# Patient Record
Sex: Female | Born: 1970 | Race: Black or African American | Hispanic: No | Marital: Single | Smoking: Never smoker
Health system: Southern US, Community
[De-identification: ages and names within clinical notes are randomized; demographics above are authoritative.]

## PROBLEM LIST (undated history)

## (undated) ENCOUNTER — Emergency Department (HOSPITAL_COMMUNITY): Admission: EM | Payer: Medicare HMO | Source: Home / Self Care

## (undated) DIAGNOSIS — F1910 Other psychoactive substance abuse, uncomplicated: Secondary | ICD-10-CM

## (undated) DIAGNOSIS — T7840XA Allergy, unspecified, initial encounter: Secondary | ICD-10-CM

## (undated) DIAGNOSIS — R569 Unspecified convulsions: Secondary | ICD-10-CM

## (undated) DIAGNOSIS — I10 Essential (primary) hypertension: Secondary | ICD-10-CM

## (undated) DIAGNOSIS — K219 Gastro-esophageal reflux disease without esophagitis: Secondary | ICD-10-CM

## (undated) DIAGNOSIS — F32A Depression, unspecified: Secondary | ICD-10-CM

## (undated) DIAGNOSIS — F1010 Alcohol abuse, uncomplicated: Secondary | ICD-10-CM

## (undated) DIAGNOSIS — R011 Cardiac murmur, unspecified: Secondary | ICD-10-CM

## (undated) DIAGNOSIS — F329 Major depressive disorder, single episode, unspecified: Secondary | ICD-10-CM

## (undated) DIAGNOSIS — F319 Bipolar disorder, unspecified: Secondary | ICD-10-CM

## (undated) HISTORY — DX: Allergy, unspecified, initial encounter: T78.40XA

## (undated) HISTORY — DX: Cardiac murmur, unspecified: R01.1

## (undated) HISTORY — PX: TUBAL LIGATION: SHX77

## (undated) HISTORY — DX: Gastro-esophageal reflux disease without esophagitis: K21.9

## (undated) HISTORY — DX: Other psychoactive substance abuse, uncomplicated: F19.10

## (undated) HISTORY — PX: ESOPHAGOPLASTY: SUR459

## (undated) HISTORY — PX: BACK SURGERY: SHX140

---

## 2000-06-15 ENCOUNTER — Emergency Department (HOSPITAL_COMMUNITY): Admission: EM | Admit: 2000-06-15 | Discharge: 2000-06-16 | Payer: Self-pay | Admitting: Emergency Medicine

## 2002-09-23 ENCOUNTER — Encounter: Payer: Self-pay | Admitting: Emergency Medicine

## 2002-09-23 ENCOUNTER — Emergency Department (HOSPITAL_COMMUNITY): Admission: EM | Admit: 2002-09-23 | Discharge: 2002-09-23 | Payer: Self-pay | Admitting: Emergency Medicine

## 2003-08-11 ENCOUNTER — Emergency Department (HOSPITAL_COMMUNITY): Admission: AD | Admit: 2003-08-11 | Discharge: 2003-08-12 | Payer: Self-pay | Admitting: Emergency Medicine

## 2003-09-23 ENCOUNTER — Emergency Department (HOSPITAL_COMMUNITY): Admission: EM | Admit: 2003-09-23 | Discharge: 2003-09-23 | Payer: Self-pay | Admitting: Family Medicine

## 2003-11-05 ENCOUNTER — Emergency Department (HOSPITAL_COMMUNITY): Admission: EM | Admit: 2003-11-05 | Discharge: 2003-11-05 | Payer: Self-pay | Admitting: Family Medicine

## 2004-02-01 ENCOUNTER — Emergency Department (HOSPITAL_COMMUNITY): Admission: EM | Admit: 2004-02-01 | Discharge: 2004-02-02 | Payer: Self-pay | Admitting: Emergency Medicine

## 2004-03-28 ENCOUNTER — Emergency Department (HOSPITAL_COMMUNITY): Admission: EM | Admit: 2004-03-28 | Discharge: 2004-03-28 | Payer: Self-pay | Admitting: Family Medicine

## 2004-04-18 ENCOUNTER — Emergency Department (HOSPITAL_COMMUNITY): Admission: EM | Admit: 2004-04-18 | Discharge: 2004-04-18 | Payer: Self-pay | Admitting: Family Medicine

## 2004-06-13 ENCOUNTER — Emergency Department (HOSPITAL_COMMUNITY): Admission: EM | Admit: 2004-06-13 | Discharge: 2004-06-13 | Payer: Self-pay | Admitting: Family Medicine

## 2004-07-12 ENCOUNTER — Emergency Department (HOSPITAL_COMMUNITY): Admission: EM | Admit: 2004-07-12 | Discharge: 2004-07-12 | Payer: Self-pay | Admitting: Family Medicine

## 2004-08-19 ENCOUNTER — Emergency Department (HOSPITAL_COMMUNITY): Admission: EM | Admit: 2004-08-19 | Discharge: 2004-08-19 | Payer: Self-pay | Admitting: Family Medicine

## 2004-12-17 ENCOUNTER — Emergency Department (HOSPITAL_COMMUNITY): Admission: EM | Admit: 2004-12-17 | Discharge: 2004-12-17 | Payer: Self-pay | Admitting: Emergency Medicine

## 2005-03-25 ENCOUNTER — Emergency Department (HOSPITAL_COMMUNITY): Admission: EM | Admit: 2005-03-25 | Discharge: 2005-03-25 | Payer: Self-pay | Admitting: Family Medicine

## 2005-04-09 ENCOUNTER — Emergency Department (HOSPITAL_COMMUNITY): Admission: EM | Admit: 2005-04-09 | Discharge: 2005-04-09 | Payer: Self-pay | Admitting: Family Medicine

## 2005-05-12 ENCOUNTER — Encounter: Admission: RE | Admit: 2005-05-12 | Discharge: 2005-05-12 | Payer: Self-pay | Admitting: Gastroenterology

## 2005-05-16 ENCOUNTER — Encounter: Admission: RE | Admit: 2005-05-16 | Discharge: 2005-05-16 | Payer: Self-pay | Admitting: Gastroenterology

## 2005-05-25 ENCOUNTER — Emergency Department (HOSPITAL_COMMUNITY): Admission: EM | Admit: 2005-05-25 | Discharge: 2005-05-26 | Payer: Self-pay | Admitting: Emergency Medicine

## 2005-06-01 ENCOUNTER — Emergency Department (HOSPITAL_COMMUNITY): Admission: EM | Admit: 2005-06-01 | Discharge: 2005-06-01 | Payer: Self-pay | Admitting: Family Medicine

## 2005-06-18 ENCOUNTER — Ambulatory Visit: Payer: Self-pay | Admitting: Family Medicine

## 2005-06-25 ENCOUNTER — Ambulatory Visit (HOSPITAL_COMMUNITY): Admission: RE | Admit: 2005-06-25 | Discharge: 2005-06-25 | Payer: Self-pay | Admitting: Gastroenterology

## 2005-07-24 ENCOUNTER — Encounter (INDEPENDENT_AMBULATORY_CARE_PROVIDER_SITE_OTHER): Payer: Self-pay | Admitting: Family Medicine

## 2005-07-24 LAB — CONVERTED CEMR LAB: Pap Smear: NORMAL

## 2005-08-21 ENCOUNTER — Other Ambulatory Visit: Admission: RE | Admit: 2005-08-21 | Discharge: 2005-08-21 | Payer: Self-pay | Admitting: Family Medicine

## 2005-08-21 ENCOUNTER — Ambulatory Visit: Payer: Self-pay | Admitting: Family Medicine

## 2005-08-21 ENCOUNTER — Encounter: Payer: Self-pay | Admitting: Family Medicine

## 2006-02-20 ENCOUNTER — Emergency Department (HOSPITAL_COMMUNITY): Admission: EM | Admit: 2006-02-20 | Discharge: 2006-02-20 | Payer: Self-pay | Admitting: Family Medicine

## 2006-03-06 ENCOUNTER — Emergency Department (HOSPITAL_COMMUNITY): Admission: EM | Admit: 2006-03-06 | Discharge: 2006-03-06 | Payer: Self-pay | Admitting: Family Medicine

## 2006-03-09 ENCOUNTER — Emergency Department (HOSPITAL_COMMUNITY): Admission: EM | Admit: 2006-03-09 | Discharge: 2006-03-09 | Payer: Self-pay | Admitting: Family Medicine

## 2006-03-20 ENCOUNTER — Emergency Department (HOSPITAL_COMMUNITY): Admission: EM | Admit: 2006-03-20 | Discharge: 2006-03-20 | Payer: Self-pay | Admitting: Emergency Medicine

## 2006-05-01 ENCOUNTER — Emergency Department (HOSPITAL_COMMUNITY): Admission: EM | Admit: 2006-05-01 | Discharge: 2006-05-01 | Payer: Self-pay | Admitting: Family Medicine

## 2006-06-23 ENCOUNTER — Emergency Department (HOSPITAL_COMMUNITY): Admission: EM | Admit: 2006-06-23 | Discharge: 2006-06-23 | Payer: Self-pay | Admitting: Emergency Medicine

## 2006-09-23 ENCOUNTER — Emergency Department (HOSPITAL_COMMUNITY): Admission: EM | Admit: 2006-09-23 | Discharge: 2006-09-23 | Payer: Self-pay | Admitting: Family Medicine

## 2007-01-29 ENCOUNTER — Emergency Department (HOSPITAL_COMMUNITY): Admission: EM | Admit: 2007-01-29 | Discharge: 2007-01-29 | Payer: Self-pay | Admitting: Family Medicine

## 2007-02-05 ENCOUNTER — Emergency Department (HOSPITAL_COMMUNITY): Admission: EM | Admit: 2007-02-05 | Discharge: 2007-02-05 | Payer: Self-pay | Admitting: Family Medicine

## 2007-02-17 ENCOUNTER — Encounter (INDEPENDENT_AMBULATORY_CARE_PROVIDER_SITE_OTHER): Payer: Self-pay | Admitting: Family Medicine

## 2007-02-17 DIAGNOSIS — Z8669 Personal history of other diseases of the nervous system and sense organs: Secondary | ICD-10-CM | POA: Insufficient documentation

## 2007-02-17 DIAGNOSIS — K219 Gastro-esophageal reflux disease without esophagitis: Secondary | ICD-10-CM | POA: Insufficient documentation

## 2007-02-17 DIAGNOSIS — F329 Major depressive disorder, single episode, unspecified: Secondary | ICD-10-CM | POA: Insufficient documentation

## 2007-02-18 ENCOUNTER — Telehealth (INDEPENDENT_AMBULATORY_CARE_PROVIDER_SITE_OTHER): Payer: Self-pay | Admitting: *Deleted

## 2007-04-08 ENCOUNTER — Ambulatory Visit (HOSPITAL_COMMUNITY): Admission: RE | Admit: 2007-04-08 | Discharge: 2007-04-08 | Payer: Self-pay | Admitting: Gastroenterology

## 2007-04-15 ENCOUNTER — Emergency Department (HOSPITAL_COMMUNITY): Admission: EM | Admit: 2007-04-15 | Discharge: 2007-04-15 | Payer: Self-pay | Admitting: Emergency Medicine

## 2007-06-03 ENCOUNTER — Inpatient Hospital Stay (HOSPITAL_COMMUNITY): Admission: EM | Admit: 2007-06-03 | Discharge: 2007-06-04 | Payer: Self-pay | Admitting: Emergency Medicine

## 2007-06-05 ENCOUNTER — Other Ambulatory Visit: Payer: Self-pay | Admitting: Emergency Medicine

## 2007-06-05 ENCOUNTER — Inpatient Hospital Stay (HOSPITAL_COMMUNITY): Admission: EM | Admit: 2007-06-05 | Discharge: 2007-06-11 | Payer: Self-pay | Admitting: Psychiatry

## 2007-06-07 ENCOUNTER — Ambulatory Visit: Payer: Self-pay | Admitting: Psychiatry

## 2007-06-19 ENCOUNTER — Emergency Department (HOSPITAL_COMMUNITY): Admission: EM | Admit: 2007-06-19 | Discharge: 2007-06-19 | Payer: Self-pay | Admitting: Emergency Medicine

## 2007-07-28 ENCOUNTER — Emergency Department (HOSPITAL_COMMUNITY): Admission: EM | Admit: 2007-07-28 | Discharge: 2007-07-28 | Payer: Self-pay | Admitting: Emergency Medicine

## 2007-08-11 ENCOUNTER — Emergency Department (HOSPITAL_COMMUNITY): Admission: EM | Admit: 2007-08-11 | Discharge: 2007-08-11 | Payer: Self-pay | Admitting: Emergency Medicine

## 2007-08-29 ENCOUNTER — Emergency Department (HOSPITAL_COMMUNITY): Admission: EM | Admit: 2007-08-29 | Discharge: 2007-08-29 | Payer: Self-pay | Admitting: Emergency Medicine

## 2008-12-19 ENCOUNTER — Ambulatory Visit (HOSPITAL_COMMUNITY): Admission: RE | Admit: 2008-12-19 | Discharge: 2008-12-19 | Payer: Self-pay | Admitting: Gastroenterology

## 2009-05-26 ENCOUNTER — Emergency Department (HOSPITAL_COMMUNITY): Admission: EM | Admit: 2009-05-26 | Discharge: 2009-05-26 | Payer: Self-pay | Admitting: Emergency Medicine

## 2009-05-26 ENCOUNTER — Emergency Department (HOSPITAL_COMMUNITY): Admission: EM | Admit: 2009-05-26 | Discharge: 2009-05-27 | Payer: Self-pay | Admitting: Emergency Medicine

## 2009-05-27 ENCOUNTER — Emergency Department (HOSPITAL_COMMUNITY): Admission: EM | Admit: 2009-05-27 | Discharge: 2009-05-28 | Payer: Self-pay | Admitting: Emergency Medicine

## 2009-06-07 ENCOUNTER — Emergency Department (HOSPITAL_COMMUNITY): Admission: EM | Admit: 2009-06-07 | Discharge: 2009-06-15 | Payer: Self-pay | Admitting: Emergency Medicine

## 2009-06-27 ENCOUNTER — Emergency Department (HOSPITAL_COMMUNITY): Admission: EM | Admit: 2009-06-27 | Discharge: 2009-06-28 | Payer: Self-pay | Admitting: Emergency Medicine

## 2009-08-01 ENCOUNTER — Emergency Department (HOSPITAL_COMMUNITY): Admission: EM | Admit: 2009-08-01 | Discharge: 2009-08-01 | Payer: Self-pay | Admitting: Emergency Medicine

## 2009-10-30 ENCOUNTER — Ambulatory Visit (HOSPITAL_COMMUNITY): Admission: RE | Admit: 2009-10-30 | Discharge: 2009-10-30 | Payer: Self-pay | Admitting: Gastroenterology

## 2010-06-15 ENCOUNTER — Encounter: Payer: Self-pay | Admitting: Gastroenterology

## 2010-06-16 ENCOUNTER — Encounter: Payer: Self-pay | Admitting: Internal Medicine

## 2010-08-11 LAB — HEMOGLOBIN A1C
Hgb A1c MFr Bld: 5.7 % (ref 4.6–6.1)
Mean Plasma Glucose: 117 mg/dL

## 2010-08-11 LAB — COMPREHENSIVE METABOLIC PANEL
ALT: 56 U/L — ABNORMAL HIGH (ref 0–35)
AST: 159 U/L — ABNORMAL HIGH (ref 0–37)
Albumin: 3.9 g/dL (ref 3.5–5.2)
Alkaline Phosphatase: 88 U/L (ref 39–117)
BUN: 10 mg/dL (ref 6–23)
CO2: 22 mEq/L (ref 19–32)
Calcium: 8.8 mg/dL (ref 8.4–10.5)
Chloride: 106 mEq/L (ref 96–112)
Creatinine, Ser: 0.82 mg/dL (ref 0.4–1.2)
GFR calc Af Amer: >60 mL/min (ref >60)
GFR calc non Af Amer: >60 mL/min (ref >60)
Glucose, Bld: 79 mg/dL (ref 70–99)
Potassium: 3.5 mEq/L (ref 3.5–5.1)
Sodium: 138 mEq/L (ref 135–145)
Total Bilirubin: 0.5 mg/dL (ref 0.3–1.2)
Total Protein: 8 g/dL (ref 6.0–8.3)

## 2010-08-11 LAB — POCT PREGNANCY, URINE: Preg Test, Ur: NEGATIVE

## 2010-08-11 LAB — DIFFERENTIAL
Basophils Absolute: 0 10*3/uL (ref 0.0–0.1)
Basophils Absolute: 0.1 10*3/uL (ref 0.0–0.1)
Basophils Relative: 0 % (ref 0–1)
Basophils Relative: 1 % (ref 0–1)
Eosinophils Absolute: 0 10*3/uL (ref 0.0–0.7)
Eosinophils Absolute: 0 10*3/uL (ref 0.0–0.7)
Eosinophils Relative: 0 % (ref 0–5)
Eosinophils Relative: 0 % (ref 0–5)
Lymphocytes Relative: 23 % (ref 12–46)
Lymphocytes Relative: 15 % (ref 12–46)
Lymphs Abs: 1.2 10*3/uL (ref 0.7–4.0)
Lymphs Abs: 0.9 10*3/uL (ref 0.7–4.0)
Monocytes Absolute: 0.5 10*3/uL (ref 0.1–1.0)
Monocytes Absolute: 0.2 10*3/uL (ref 0.1–1.0)
Monocytes Relative: 9 % (ref 3–12)
Monocytes Relative: 4 % (ref 3–12)
Neutro Abs: 3.6 10*3/uL (ref 1.7–7.7)
Neutro Abs: 4.5 10*3/uL (ref 1.7–7.7)
Neutrophils Relative %: 68 % (ref 43–77)
Neutrophils Relative %: 80 % — ABNORMAL HIGH (ref 43–77)

## 2010-08-11 LAB — URINALYSIS, ROUTINE W REFLEX MICROSCOPIC
Bilirubin Urine: NEGATIVE
Glucose, UA: NEGATIVE mg/dL
Glucose, UA: NEGATIVE mg/dL
Hgb urine dipstick: NEGATIVE
Hgb urine dipstick: NEGATIVE
Ketones, ur: >80 mg/dL — AB
Ketones, ur: 15 mg/dL — AB
Nitrite: NEGATIVE
Nitrite: NEGATIVE
Protein, ur: NEGATIVE mg/dL
Protein, ur: NEGATIVE mg/dL
Specific Gravity, Urine: 1.031 — ABNORMAL HIGH (ref 1.005–1.030)
Specific Gravity, Urine: 1.016 (ref 1.005–1.030)
Urobilinogen, UA: 1 mg/dL (ref 0.0–1.0)
Urobilinogen, UA: 1 mg/dL (ref 0.0–1.0)
pH: 5.5 (ref 5.0–8.0)
pH: 6 (ref 5.0–8.0)

## 2010-08-11 LAB — POCT URINALYSIS DIP (DEVICE)
Glucose, UA: NEGATIVE mg/dL
Ketones, ur: 80 mg/dL — AB
Nitrite: NEGATIVE
Protein, ur: 100 mg/dL — AB
Specific Gravity, Urine: >=1.03 (ref 1.005–1.030)
Urobilinogen, UA: 1 mg/dL (ref 0.0–1.0)
pH: 5.5 (ref 5.0–8.0)

## 2010-08-11 LAB — CBC
HCT: 42.2 % (ref 36.0–46.0)
HCT: 40.5 % (ref 36.0–46.0)
Hemoglobin: 13.9 g/dL (ref 12.0–15.0)
Hemoglobin: 12.9 g/dL (ref 12.0–15.0)
MCHC: 32.9 g/dL (ref 30.0–36.0)
MCHC: 31.9 g/dL (ref 30.0–36.0)
MCV: 69.2 fL — ABNORMAL LOW (ref 78.0–100.0)
MCV: 69.3 fL — ABNORMAL LOW (ref 78.0–100.0)
Platelets: 177 10*3/uL (ref 150–400)
Platelets: 250 10*3/uL (ref 150–400)
RBC: 6.11 MIL/uL — ABNORMAL HIGH (ref 3.87–5.11)
RBC: 5.85 MIL/uL — ABNORMAL HIGH (ref 3.87–5.11)
RDW: 18.5 % — ABNORMAL HIGH (ref 11.5–15.5)
RDW: 19.8 % — ABNORMAL HIGH (ref 11.5–15.5)
WBC: 5.3 10*3/uL (ref 4.0–10.5)
WBC: 5.7 10*3/uL (ref 4.0–10.5)

## 2010-08-11 LAB — RAPID URINE DRUG SCREEN, HOSP PERFORMED
Amphetamines: NOT DETECTED
Amphetamines: NOT DETECTED
Barbiturates: NOT DETECTED
Barbiturates: NOT DETECTED
Benzodiazepines: NOT DETECTED
Benzodiazepines: POSITIVE — AB
Cocaine: NOT DETECTED
Cocaine: NOT DETECTED
Opiates: NOT DETECTED
Opiates: NOT DETECTED
Tetrahydrocannabinol: NOT DETECTED
Tetrahydrocannabinol: NOT DETECTED

## 2010-08-11 LAB — URINE MICROSCOPIC-ADD ON

## 2010-08-11 LAB — LIPID PANEL
Cholesterol: 143 mg/dL (ref 0–200)
HDL: 64 mg/dL (ref >39)
LDL Cholesterol: 69 mg/dL (ref 0–99)
Total CHOL/HDL Ratio: 2.2 RATIO
Triglycerides: 51 mg/dL (ref <150)
VLDL: 10 mg/dL (ref 0–40)

## 2010-08-11 LAB — URINE CULTURE: Colony Count: >=100000

## 2010-08-11 LAB — PREGNANCY, URINE: Preg Test, Ur: NEGATIVE

## 2010-08-11 LAB — BASIC METABOLIC PANEL
BUN: 6 mg/dL (ref 6–23)
CO2: 26 mEq/L (ref 19–32)
Calcium: 9.1 mg/dL (ref 8.4–10.5)
Chloride: 99 mEq/L (ref 96–112)
Creatinine, Ser: 0.86 mg/dL (ref 0.4–1.2)
GFR calc Af Amer: >60 mL/min (ref >60)
GFR calc non Af Amer: >60 mL/min (ref >60)
Glucose, Bld: 108 mg/dL — ABNORMAL HIGH (ref 70–99)
Potassium: 3.5 mEq/L (ref 3.5–5.1)
Sodium: 133 mEq/L — ABNORMAL LOW (ref 135–145)

## 2010-08-11 LAB — TSH: TSH: 0.858 u[IU]/mL (ref 0.350–4.500)

## 2010-08-11 LAB — T4, FREE: Free T4: 1.61 ng/dL (ref 0.80–1.80)

## 2010-08-11 LAB — ETHANOL
Alcohol, Ethyl (B): <5 mg/dL (ref 0–10)
Alcohol, Ethyl (B): <5 mg/dL (ref 0–10)

## 2010-08-11 LAB — MONONUCLEOSIS SCREEN: Mono Screen: NEGATIVE

## 2010-08-11 LAB — VALPROIC ACID LEVEL: Valproic Acid Lvl: 100.3 ug/mL — ABNORMAL HIGH (ref 50.0–100.0)

## 2010-08-14 LAB — DIFFERENTIAL
Basophils Absolute: 0 10*3/uL (ref 0.0–0.1)
Basophils Relative: 0 % (ref 0–1)
Eosinophils Absolute: 0.1 10*3/uL (ref 0.0–0.7)
Eosinophils Relative: 1 % (ref 0–5)
Lymphocytes Relative: 35 % (ref 12–46)
Lymphs Abs: 1.8 10*3/uL (ref 0.7–4.0)
Monocytes Absolute: 0.5 10*3/uL (ref 0.1–1.0)
Monocytes Relative: 10 % (ref 3–12)
Neutro Abs: 2.7 10*3/uL (ref 1.7–7.7)
Neutrophils Relative %: 54 % (ref 43–77)

## 2010-08-14 LAB — BASIC METABOLIC PANEL
BUN: 5 mg/dL — ABNORMAL LOW (ref 6–23)
CO2: 23 mEq/L (ref 19–32)
Calcium: 8.7 mg/dL (ref 8.4–10.5)
Chloride: 109 mEq/L (ref 96–112)
Creatinine, Ser: 0.78 mg/dL (ref 0.4–1.2)
GFR calc Af Amer: >60 mL/min (ref >60)
GFR calc non Af Amer: >60 mL/min (ref >60)
Glucose, Bld: 97 mg/dL (ref 70–99)
Potassium: 3.1 mEq/L — ABNORMAL LOW (ref 3.5–5.1)
Sodium: 141 mEq/L (ref 135–145)

## 2010-08-14 LAB — RAPID URINE DRUG SCREEN, HOSP PERFORMED
Amphetamines: NOT DETECTED
Barbiturates: NOT DETECTED
Benzodiazepines: POSITIVE — AB
Cocaine: NOT DETECTED
Opiates: NOT DETECTED
Tetrahydrocannabinol: NOT DETECTED

## 2010-08-14 LAB — URINE MICROSCOPIC-ADD ON

## 2010-08-14 LAB — URINALYSIS, ROUTINE W REFLEX MICROSCOPIC
Glucose, UA: NEGATIVE mg/dL
Leukocytes, UA: NEGATIVE
Nitrite: NEGATIVE
Protein, ur: NEGATIVE mg/dL
Specific Gravity, Urine: 1.035 — ABNORMAL HIGH (ref 1.005–1.030)
Urobilinogen, UA: 1 mg/dL (ref 0.0–1.0)
pH: 6 (ref 5.0–8.0)

## 2010-08-14 LAB — CBC
HCT: 39.1 % (ref 36.0–46.0)
Hemoglobin: 12.6 g/dL (ref 12.0–15.0)
MCHC: 32.3 g/dL (ref 30.0–36.0)
MCV: 71.2 fL — ABNORMAL LOW (ref 78.0–100.0)
Platelets: 202 10*3/uL (ref 150–400)
RBC: 5.5 MIL/uL — ABNORMAL HIGH (ref 3.87–5.11)
RDW: 22.2 % — ABNORMAL HIGH (ref 11.5–15.5)
WBC: 5.1 10*3/uL (ref 4.0–10.5)

## 2010-08-14 LAB — ETHANOL: Alcohol, Ethyl (B): <5 mg/dL (ref 0–10)

## 2010-08-14 LAB — POCT PREGNANCY, URINE: Preg Test, Ur: NEGATIVE

## 2010-08-14 LAB — VALPROIC ACID LEVEL: Valproic Acid Lvl: 44.6 ug/mL — ABNORMAL LOW (ref 50.0–100.0)

## 2010-10-08 NOTE — Op Note (Signed)
NAME:  Melissa Dougherty, Melissa Dougherty NO.:  [id number]   MEDICAL RECORD NO.:  [id number]          PATIENT TYPE:  AMB   LOCATION:  ENDO                         FACILITY:  [facility]   PHYSICIAN:  Graylin Shiver, M.D.   DATE OF BIRTH:  Aug 12, 1970   DATE OF PROCEDURE:  04/08/2007  DATE OF DISCHARGE:                               OPERATIVE REPORT   PROCEDURE PERFORMED:  Esophagogastroduodenoscopy with endoscopic balloon  dilatation.   INDICATIONS FOR PROCEDURE:  Dysphagia.   BRIEF HISTORY:  The patient is a 40 year old female with complaints of  dysphagia.  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 The patient had a barium swallow  in the past and it showed that the barium tablet lodged around the  distal esophagus near the esophagogastric junction area.  She underwent  EGD with dilatation in January 2007, with dilatation of both the region  of the EG junction, even though a stricture was not seen, and also  dilatation of the esophageal ring or web at 23 cm.  The patient's mother  did report to me recently that she did have some surgery on her  esophagus when she was an infant and this web or ring may be actually an  anastomosis site  Informed consent was obtained after explanation of the  risks of bleeding, infection and perforation.   PREMEDICATION:  Fentanyl 50 mcg IV, Versed 5 mg IV.   PROCEDURE:  With the patient in the left lateral decubitus position, the  Pentax gastroscope was inserted into the oropharynx and passed into the  esophagus.  It was advanced down the esophagus then into the stomach and  into the duodenum. The second portion and bulb of the duodenum looked  normal.  The stomach looked normal in its entirety as far as the mucosa  was concerned.  There was a small hiatal hernia noted. The scope was  then brought back into the esophagus.  The esophagogastric junction was  visualized. I did not obviously see a  stricture in this area but I did  place an endoscopic balloon dilator at the level of the EG junction and  inflated the balloon to 12 then 13.5 then 15 mm and held it in place at  each level for one minute. I then brought the scope back up to 23 cm  where the esophageal web was and dilated this area to 12, 13.5 and 15  mm, again, holding the dilator in place for one minute. The dilations  went well.  There was no heme at either site. She tolerated the  procedure well without complications.   IMPRESSION:  1. 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  2. Hiatal hernia.   PLAN:  We will observe the response to the dilatation.  Should she  continue to have dysphasia, will have to consider bringing her back for  repeat dilatation.           ______________________________  Graylin Shiver, M.D.  SFG/MEDQ  D:  04/08/2007  T:  04/08/2007  Job:  [id number]   cc:   Quita Skye. Artis Flock, M.D.  Fax: [phone]

## 2010-10-08 NOTE — Op Note (Signed)
Melissa Dougherty, Melissa Dougherty              ACCOUNT NO.:  [id number]   MEDICAL RECORD NO.:  [id number]          PATIENT TYPE:  AMB   LOCATION:  ENDO                         [facility]ACILITY[facility]  [facility]   PHYSICIAN:  Graylin Shiver, M.D.   DATE OF BIRTH:  21-Jun-1970   DATE OF PROCEDURE:  12/19/2008  DATE OF DISCHARGE:                               OPERATIVE REPORT   PROCEDURE:  Esophagogastroduodenoscopy with endoscopic balloon  dilatation.   INDICATIONS:  The patient has been experiencing dysphagia for the past 2-  3 months to solid food.  She has a history of an esophageal ring located  at 23 cm in the esophagus and also a hiatal hernia.  She has undergone  two endoscopies with dilatation in the past.  The dilatations were of  the esophageal ring at 23 cm and also at the EG junction because a  barium pill lodged at this level also even though a stricture was not  seen.  The previous dilatations helped her dysphagia.  The last one was  done in 2008.  Due to recurrent dysphagia, she is undergoing repeat  endoscopy with dilatation.   Informed consent was obtained after explanation of the risks of  bleeding, infection and perforation.   PREMEDICATIONS:  Fentanyl 50 mcg IV, Versed 3 mg IV.   PROCEDURE:  With the patient in the left lateral decubitus position, the  Pentax gastroscope was inserted into the oropharynx and passed into the  esophagus.  It was advanced down the esophagus then into the stomach and  into the duodenum.  The second portion and bulb of the duodenum were  normal.  The stomach had normal-appearing mucosa.  There was a hiatal  hernia.  The esophagus did not reveal a stricture at the esophagogastric  junction, but because of her history of dysphagia, I went ahead and  dilated the esophagogastric junction with a balloon dilator.  The  endoscopic balloon dilator was placed at the level of the  esophagogastric junction and held in place.  It was inflated to 12, then  13.5, then 15 mm and  held in place at each level for 30 seconds.  The  scope was then brought up to the more proximal esophagus where at 23 cm  an esophageal ring was noted.  This had been there before.  This was  also dilated with the balloon dilator to 12, 13.5 and 15 mm, holding the  dilator inflated in place between 30 and 60 seconds.  She tolerated the  procedure well without complications.   IMPRESSION:  1. Dysphagia to solid foods.  2. Esophageal ring at 23 cm, dilated to 15 mm.  3. Esophagogastric junction dilated to 15 mm.   PLAN:  Observe response to dilatation.           ______________________________  Graylin Shiver, M.D.     SFG/MEDQ  D:  12/19/2008  T:  12/19/2008  Job:  [id number]

## 2010-10-08 NOTE — H&P (Signed)
[name], Melissa Dougherty              ACCOUNT NO.:  [id number]   MEDICAL RECORD NO.:  [id number]          PATIENT TYPE:  INP   LOCATION:  4714                         FACILITY:  [facility]   PHYSICIAN:  Fleet Contras, M.D.    DATE OF BIRTH:  05-16-1971   DATE OF ADMISSION:  06/03/2007  DATE OF DISCHARGE:                              HISTORY & PHYSICAL   REPORT TITLE:  HISTORY AND PHYSICAL.   PRESENTING COMPLAINT:  Passing out.   HISTORY OF PRESENT ILLNESS:  The patient is a 40 year old African  American lady with past medical history significant for schizophrenia,  seizure disorder and gastroesophageal reflux disease.  She presented to  the emergency room at [facility] in the early hours of this day  with complaints of having episodes of passing out.  She stated that she  has been feeling unwell for about a week or so and not eating much,  throwing up, unable to keep food or drinks down, not been taking her  seizure medications.  She has no weakness.  She had a seizure, but she  said she has been having episodes of passing out at home.  She did not  hit her head or have any other injuries, but she was having difficulty  walking when she came to the emergency room.  She denies having any  blurring of vision, weakness of extremities, slurring of speech, chest  pain, shortness of breath, orthopnea, palpitations or paroxysmal  nocturnal dyspnea.  At the emergency room her blood pressure was 111/72  on arrival, heart rate of 86 regular, respiratory of 20, temperature  97.7, and O2 saturations on room air was 100%.  She was evaluated by the  emergency room physicians and thought to have had a seizure.  Initial  laboratory data showed a potassium of 3.4.  Her valproic acid levels  were slightly low at 40.5.  Urine drug screen was positive for opiates  which was not prescribed by me.  She was last seen in my office about a  year ago and has not returned for followup since then.  She admits  to  not being compliant with her medications.   PAST MEDICAL HISTORY:  1. Schizophrenia.  2. Gastroesophageal reflux disease.  3. Seizure disorder.   MEDICATION HISTORY:  1. Risperdal 1 mg every night.  2. Depakote 50 mg daily every night.  3. MiraLax 1 tablespoon daily p.r.n.  4. Protonix 40 mg once a day.   ALLERGIES:  SHE HAS NO KNOWN DRUG ALLERGIES.   FAMILY AND SOCIAL HISTORY:  She lives with her children.  Father is  deceased of unknown cause.  Her mother is alive and well.  She has 1  brother who is alive and well.  She has 2 children, 1 boy and a girl who  are alive and well.  She denies any use of alcohol, tobacco or illicit  drugs.   REVIEW OF SYSTEMS:  Essentially as above.   PHYSICAL EXAMINATION:  GENERAL:  She is lying comfortably in the  hospital bed not in acute respiratory or painful distress.  She is  not  pale.  She is not icteric.  She is not cyanosed.  HEENT:  She is slightly dehydrated with dry oral or tongue mucosa.  Her  pupils are equal and reactive to light and accommodation.  VITAL SIGNS:  Blood pressure of 112/81, heart rate of 82, respiratory  rate of 22, and temperature 97.2.  NECK:  Supple with no elevated JVD.  No cervical lymphadenopathy.  CHEST:  Shows good air entry bilaterally with no rales, rhonchi or  wheezes.  HEART:  Heart sounds 1 and 2 are heard with no murmurs.  ABDOMEN:  Flat, soft, nontender.  There are multiple scars in the  abdomen.  Bowel sounds are present.  EXTREMITIES:  Shows no edema, no  calf tenderness or swelling.  CNS: She is alert and oriented x3 with no focal neurological deficits.   LABORATORY DATA:  White count is 5.8, hemoglobin 12.8, hematocrit 40,  platelet count of 193.  Sodium is 138, potassium 3.4, chloride 107,  bicarbonate of 22.7, BUN 5, creatinine 1.  Valproic acid level is 4.5.  An EKG shows normal sinus rhythm with no acute ST-T wave changes.  A CT  scan is reported as showing no acute intracranial  lesions.  Urine drug  screen is positive for opiates, negative for cocaine, benzodiazepines or  cannabinoids.  Urinalysis is normal and urine pregnancy test is  negative.   ASSESSMENT:  The patient is a 36-year African American lady with past  medical history significant for seizure disorder admitted with episodes  of syncope most likely representing seizure episodes due to  noncompliance with prescribed medications subtherapeutic levels of  Depakote.  She will be admitted to the hospital for further evaluation.   ADMISSION DIAGNOSES:  1. Seizure disorder.  2. Mild dehydration.  3. Hypokalemia.   PLAN OF CARE:  She will be placed on a telemetry bed.  An MRI scan of  the brain will be performed to rule out any acute intracranial process  not detected on CT scan.  She will be on IV fluids, normal saline at 75  cc an hour.  Home medication will be resumed including Protonix,  Risperdal and Depakote.  Some physical therapy consult will be requested  to help ambulate her.  Laboratory studies will be repeated in the a.m.  to see any improvement.  Potassium will be repleted.  This plan of care  has been discussed with her and her questions answered.      Fleet Contras, M.D.  Electronically Signed     EA/MEDQ  D:  06/03/2007  T:  06/04/2007  Job:  [id number]

## 2010-10-08 NOTE — Discharge Summary (Signed)
Melissa Dougherty, Melissa Dougherty NO.:  [id number]   MEDICAL RECORD NO.:  [id number]          PATIENT TYPE:  IPS   LOCATION:  0400                          [facility]  [facility]   PHYSICIAN:  Anselm Jungling, MD  DATE OF BIRTH:  1970/06/28   DATE OF ADMISSION:  06/05/2007  DATE OF DISCHARGE:  06/11/2007                               DISCHARGE SUMMARY   IDENTIFYING DATA AND REASON FOR ADMISSION:  This was an inpatient  psychiatric admission for Melissa Dougherty, a 40 year old African American female  who was admitted due to increasing signs and symptoms of schizoaffective  disorder.  Please refer to the admission note for further details  pertaining to the symptoms, circumstances and history that led to her  hospitalization.  She was given an initial Axis I diagnosis of  schizoaffective disorder, NOS.   MEDICAL AND LABORATORY:  The patient was in good health without any  active or chronic medical problems.  She was continued on Protonix 40 mg  daily for GERD.   HOSPITAL COURSE:  The patient was admitted to the adult inpatient  psychiatric service.  She presented as a well-nourished, well-developed  adult female, who appeared to have some developmental issues with  respect to IQ.  She was irritable, pressured, with loud vocal  intonation.  She had minimal insight into her need for treatment.  She  tried to persuade me that she did not need to be in the inpatient  setting.   The patient was treated with Risperdal and Depakote, which had shown  evidence of stabilizing her in the past.  Dosages were adjusted  appropriately.  Her mother was contacted by staff, including our case  manager.  Discharge planning was coordinated with family and the  patient.   By the seventh hospital day the patient appeared significantly improved,  with good regulation of mood and affect and absence of overt psychotic  thinking or behavior.  The patient agreed to the following aftercare  plan.  For aftercare  the patient was to follow up at the [facility]  with an appointment on June 15, 2007.  She was also to follow up with  her usual physician on June 14, 2007, for routine medical care.   DISCHARGE MEDICATIONS:  1. Risperdal Consta 37.5 mg IM, next injection due June 17, 2007      and every 14 days thereafter.  2. Depakote ER 500 mg 2 tablets at bedtime.  3. Risperdal M-Tab 4 mg q.h.s.  4. Protonix 40 mg daily.   DISCHARGE DIAGNOSES:  AXIS I:  Schizoaffective disorder, most recently  with manic features.  AXIS II:  Deferred.  AXIS III:  Gastroesophageal reflux disease.  AXIS IV:  Stressors severe.  AXIS V:  GAF on discharge 55.      Anselm Jungling, MD  Electronically Signed     SPB/MEDQ  D:  06/14/2007  T:  06/14/2007  Job:  [id number]

## 2010-10-08 NOTE — H&P (Signed)
Melissa Dougherty, Melissa Dougherty NO.:  [id number]   MEDICAL RECORD NO.:  [id number]          PATIENT TYPE:  IPS   LOCATION:  0405                          [facility]ILITY[facility]  [facility]   PHYSICIAN:  Vic Ripper, P.A.-C.DATE OF BIRTH:  12/22/1970   DATE OF ADMISSION:  06/05/2007  DATE OF DISCHARGE:                       PSYCHIATRIC ADMISSION ASSESSMENT   This is a voluntary admission to the services of Dr. Geralyn Flash.   IDENTIFYING INFORMATION:  This is a 40 year old single African-American  female. She was admitted to the main hospital on December 8. She was  discharged the following morning on December 9. She was admitted due to  passing out. She was complaining of nausea and vomiting. Her urine was  positive for opiates. Her Depakote level was subtherapeutic at 4.5. She  came back to [facility] ED around noon on 1/10, complaining of a  headache. No drugs were found. She was also complaining of generalized  pain.  She reported she was feeling crazy, that she was suicidal. The  plan was to overdose or walk out in the road.  She does have a history  for schizoaffective disorder. She is followed at the [facility] by  the nurse practitioner Ronalee Belts and according to her pickup  records at [facility], she has been noncompliant with both her  Risperdal and her Depakote.  She recently picked up Risperdal on 1/6,  and has not had her Depakote filled since last September.   PAST PSYCHIATRIC HISTORY:  She was admitted to [facility] in  1999, and also to a facility in [place]. She is currently followed as an  outpatient at [facility] health.   SOCIAL HISTORY:  She went to the eleventh grade. She has never married.  She has a son age 87, a daughter age 20. She gets SSI, but she states  the DSS takes care of her money for her.   FAMILY HISTORY:  She denies alcohol and drug history. She denies having  a primary care physician. She does  acknowledge being followed at  [facility] by Ronalee Belts, the nurse  practitioner. She denies any PCC.   MEDICAL PROBLEMS:  She is known to have GERD and a seizure disorder.   MEDICATIONS:  According to [facility] on [address], she is suppose to be  taking 750 mg of Depakote at h.s. as well as Risperdal 1 mg at h.s. The  patient states she is due this Tuesday for a Risperdal Consta injection  at [facility]. We will put a call through to check  and see about that.   DRUG ALLERGIES:  No known drug allergies.   POSITIVE PHYSICAL FINDINGS:  Her urinalysis showed that she is beginning  to have a UTI. She had 7 to 10 WBC. Her UDS was negative. Her alcohol  level was 5. She had no other remarkable findings although today, she is  complaining of light sensitivity and headache. VITAL SIGNS: On admission  showed that she is 61 inches tall, weighs 108. Temperature is 98.2.  Blood pressure is 133/87. Pulse 108. Respirations are  16. MENTAL STATUS  EXAM TODAY: She is alert and oriented. She is casually dressed in a  combination of hospital gowns and some of her own clothes. She appears  to be adequately groomed and nourished. Her speech is normal rate,  rhythm and tone. Her mood is somewhat irritable and anxious. Her affect  is congruent. Thought processes are clear, rational and goal oriented.  She wants to get restarted on her meds, get rid of her headache and feel  better. Judgment and insight are good. Concentration and memory are  good. Intelligence is at least average.  On concentration and memory,  she was able to look up the phone number of the pharmacy in the phone  book. She denies being suicidal or homicidal. She denies auditory visual  hallucinations.   DIAGNOSES:   AXIS I:  Schizoaffective disorder noncompliant with medications.   AXIS II:  Deferred.   AXIS III:  History for seizure disorder. Syncope was probably due to  noncompliance  with Depakote as evidence by her subtherapeutic level of  Depakote. She is often known to have reflux and she is trying to state  that she has migraine headaches.   AXIS IV:  Mild-to-moderate stressors.   AXIS V:  40.   PLAN:  Is to admit for safety and stabilization. We will restart her  medications at the prescribed dosages. We will check Monday with the  nurse practitioner Ronalee Belts regarding Risperdal Consta and make  follow up plans regarding that and we will get her seen regarding her  migraine headaches and seizure disorder.  Estimated length of stay is  three to four days.      Vic Ripper, P.A.-C.     MD/MEDQ  D:  06/06/2007  T:  06/07/2007  Job:  [id number]

## 2010-10-11 NOTE — Discharge Summary (Signed)
NAMESENITA, CORREDOR              ACCOUNT NO.:  [id number]   MEDICAL RECORD NO.:  [id number]          PATIENT TYPE:  INP   LOCATION:  4714                         FACILITY:  [facility]   PHYSICIAN:  Fleet Contras, M.D.    DATE OF BIRTH:  [birth year]   DATE OF ADMISSION:  06/03/2007  DATE OF DISCHARGE:  06/04/2007                               DISCHARGE SUMMARY   HISTORY OF PRESENT ILLNESS:  Please see the full dictated admission for  full details.   SUMMARY:  Ms. Handler is a 40 year old African American lady with past  medical history for seizure disorder, , gastroesophageal reflux disease  who came to the emergency room with episodes of syncope most likely due  to seizure episodes due to poor compliance with prescribed medications  including Depakote.  She was therefore admitted to the hospital for  close monitoring and further evaluation.   HOSPITAL COURSE:  On admission shows monitored on telemetry and MRI scan  of the brain was performed.  This was negative for acute lesions, and  she received Depakote dose and it was increased to 73.7.  She was  feeling much better without any further episodes vital signs are stable.  Neurologically normal,  she was considered stable for discharge home.   DISCHARGE DIAGNOSES:  1. Seizure disorder.  2. Dehydration.  3. Hypokalemia.   CONDITION ON DISCHARGE:  Stable.   DISPOSITION:  To home.   DISCHARGE MEDICATIONS:  1. Risperdal 1 mg po qhs  2. Depakote 20 mg 30 tablets q.h.s.  3. Xanax p.r.n. 0.5mg  daily.  4. Multivitamin once a day.      Fleet Contras, M.D.  Electronically Signed     EA/MEDQ  D:  07/07/2007  T:  07/09/2007  Job:  [id number]

## 2010-10-11 NOTE — Op Note (Signed)
NAME:  Melissa Dougherty, CORELLA NO.:  [id number]   MEDICAL RECORD NO.:  [id number]          PATIENT TYPE:  AMB   LOCATION:  ENDO                         FACILITY:  [facility]   PHYSICIAN:  Graylin Shiver, M.D.   DATE OF BIRTH:  Feb 03, 1971   DATE OF PROCEDURE:  06/25/2005  DATE OF DISCHARGE:                                 OPERATIVE REPORT   INDICATIONS FOR PROCEDURE:  Dysphagia.  Recent barium swallow showed a mid  esophageal web and when the patient swallowed a barium tablet it lodged in  the region of the distal esophagus around the esophagogastric junction area,  suggesting that there could be a stricture in this area.   Informed consent was obtained after explanation of the risks of bleeding,  infection and perforation.   PREMEDICATION:  Fentanyl 30 mcg IV, Versed 3 milligrams IV.   PROCEDURE:  With the patient in the left lateral decubitus position, the  Olympus gastroscope was inserted into the oropharynx and passed into the  esophagus. It was advanced down the esophagus and into the stomach and into  the duodenum. The second portion and bulb of the duodenum looked normal. The  stomach showed a couple of small erosions in the body of the stomach but  nothing significant. The fundus and cardia had normal-appearing mucosa.  There was a small hiatal hernia. The scope was then brought back into the  esophagus. The esophagogastric junction area looked normal. I did not see  any evidence of a stricture, but because the patient had problems with  swallowing the barium pill and it getting lodged in this area, I went ahead  and dilated this area using an endoscopic balloon dilator. I appropriately  placed the balloon dilator at the EG junction and dilated this area, first  inflating the balloon to 12 mm then 13.5 mm and then 15 mm holding it in  place for 45 seconds to a minute each time.  I then brought the scope back  up.  The esophageal web was noted at 23 cm. There was some  constriction to  this and I went ahead and balloon dilated this also with the balloon dilator  to 12 mm, 13.5 mm then 15 mm holding it in place for 45 seconds to 1 minute.  No heme was noted on the dilator. The scope was removed. She tolerated the  procedure well without complications.   IMPRESSION:  1.  Hiatal hernia.  2.  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  3.  Couple of erosions in the stomach which did not appear clinically      significant.   PLAN:  We will observe the response to the dilatation and see if this helps.           ______________________________  Graylin Shiver, M.D.     SFG/MEDQ  D:  06/25/2005  T:  06/25/2005  Job:  [id number]   cc:   Quita Skye. Artis Flock, M.D.  Fax: [phone]

## 2010-11-15 ENCOUNTER — Emergency Department (HOSPITAL_COMMUNITY)
Admission: EM | Admit: 2010-11-15 | Discharge: 2010-11-15 | Disposition: A | Discharge status: Home / Self Care | Payer: Medicare Other | Attending: Emergency Medicine | Admitting: Emergency Medicine

## 2010-11-15 DIAGNOSIS — R5383 Other fatigue: Secondary | ICD-10-CM | POA: Insufficient documentation

## 2010-11-15 DIAGNOSIS — R300 Dysuria: Secondary | ICD-10-CM | POA: Insufficient documentation

## 2010-11-15 DIAGNOSIS — G40909 Epilepsy, unspecified, not intractable, without status epilepticus: Secondary | ICD-10-CM | POA: Insufficient documentation

## 2010-11-15 DIAGNOSIS — R5381 Other malaise: Secondary | ICD-10-CM | POA: Insufficient documentation

## 2010-11-15 DIAGNOSIS — K219 Gastro-esophageal reflux disease without esophagitis: Secondary | ICD-10-CM | POA: Insufficient documentation

## 2010-11-15 LAB — URINE MICROSCOPIC-ADD ON

## 2010-11-15 LAB — URINALYSIS, ROUTINE W REFLEX MICROSCOPIC
Bilirubin Urine: NEGATIVE
Glucose, UA: NEGATIVE mg/dL
Ketones, ur: NEGATIVE mg/dL
Leukocytes, UA: NEGATIVE
Nitrite: NEGATIVE
Protein, ur: NEGATIVE mg/dL
Specific Gravity, Urine: 1.007 (ref 1.005–1.030)
Urobilinogen, UA: 0.2 mg/dL (ref 0.0–1.0)
pH: 7.5 (ref 5.0–8.0)

## 2010-11-15 LAB — POCT PREGNANCY, URINE: Preg Test, Ur: NEGATIVE

## 2010-11-15 LAB — GLUCOSE, CAPILLARY: Glucose-Capillary: 114 mg/dL — ABNORMAL HIGH (ref 70–99)

## 2011-02-12 LAB — CBC
HCT: 37.3
HCT: 39.9
HCT: 40
Hemoglobin: 11.9 — ABNORMAL LOW
Hemoglobin: 12.8
Hemoglobin: 13.4
MCHC: 32
MCHC: 32
MCHC: 33.5
MCV: 68.4 — ABNORMAL LOW
MCV: 69.9 — ABNORMAL LOW
MCV: 70.6 — ABNORMAL LOW
Platelets: 193
Platelets: 193
Platelets: 209
RBC: 5.33 — ABNORMAL HIGH
RBC: 5.67 — ABNORMAL HIGH
RBC: 5.84 — ABNORMAL HIGH
RDW: 18.5 — ABNORMAL HIGH
RDW: 18.6 — ABNORMAL HIGH
RDW: 18.8 — ABNORMAL HIGH
WBC: 5
WBC: 5.4
WBC: 5.8

## 2011-02-12 LAB — BASIC METABOLIC PANEL
BUN: 4 — ABNORMAL LOW
BUN: 4 — ABNORMAL LOW
CO2: 25
CO2: 26
Calcium: 8.7
Calcium: 9.1
Chloride: 103
Chloride: 105
Creatinine, Ser: 0.72
Creatinine, Ser: 0.77
GFR calc Af Amer: >60
GFR calc Af Amer: >60
GFR calc non Af Amer: >60
GFR calc non Af Amer: >60
Glucose, Bld: 90
Glucose, Bld: 96
Potassium: 3.2 — ABNORMAL LOW
Potassium: 3.5
Sodium: 137
Sodium: 138

## 2011-02-12 LAB — I-STAT 8, (EC8 V) (CONVERTED LAB)
Acid-Base Excess: 2
Acid-base deficit: 3 — ABNORMAL HIGH
BUN: 5 — ABNORMAL LOW
BUN: 6
BUN: <3 — ABNORMAL LOW
Bicarbonate: 21.6
Bicarbonate: 22.7
Bicarbonate: 27.3 — ABNORMAL HIGH
Chloride: 106
Chloride: 107
Chloride: 108
Glucose, Bld: 81
Glucose, Bld: 84
Glucose, Bld: 84
HCT: 41
HCT: 45
HCT: 47 — ABNORMAL HIGH
Hemoglobin: 13.9
Hemoglobin: 15.3 — ABNORMAL HIGH
Hemoglobin: 16 — ABNORMAL HIGH
Operator id: 257131
Operator id: 257131
Operator id: 294341
Potassium: 3.4 — ABNORMAL LOW
Potassium: 3.4 — ABNORMAL LOW
Potassium: 5.6 — ABNORMAL HIGH
Sodium: 136
Sodium: 138
Sodium: 138
TCO2: 23
TCO2: 24
TCO2: 29
pCO2, Ven: 31.1 — ABNORMAL LOW
pCO2, Ven: 37.6 — ABNORMAL LOW
pCO2, Ven: 44.2 — ABNORMAL LOW
pH, Ven: 7.368 — ABNORMAL HIGH
pH, Ven: 7.398 — ABNORMAL HIGH
pH, Ven: 7.472 — ABNORMAL HIGH

## 2011-02-12 LAB — DIFFERENTIAL
Basophils Absolute: 0
Basophils Relative: 1
Eosinophils Absolute: 0.2
Eosinophils Relative: 4
Lymphocytes Relative: 30
Lymphs Abs: 1.7
Monocytes Absolute: 0.4
Monocytes Relative: 7
Neutro Abs: 3.3
Neutrophils Relative %: 58

## 2011-02-12 LAB — RAPID URINE DRUG SCREEN, HOSP PERFORMED
Amphetamines: NOT DETECTED
Amphetamines: NOT DETECTED
Barbiturates: NOT DETECTED
Barbiturates: NOT DETECTED
Benzodiazepines: NOT DETECTED
Benzodiazepines: NOT DETECTED
Cocaine: NOT DETECTED
Cocaine: NOT DETECTED
Opiates: NOT DETECTED
Opiates: POSITIVE — AB
Tetrahydrocannabinol: NOT DETECTED
Tetrahydrocannabinol: NOT DETECTED

## 2011-02-12 LAB — URINALYSIS, ROUTINE W REFLEX MICROSCOPIC
Bilirubin Urine: NEGATIVE
Bilirubin Urine: NEGATIVE
Glucose, UA: NEGATIVE
Glucose, UA: NEGATIVE
Hgb urine dipstick: NEGATIVE
Hgb urine dipstick: NEGATIVE
Ketones, ur: NEGATIVE
Ketones, ur: NEGATIVE
Nitrite: NEGATIVE
Nitrite: NEGATIVE
Protein, ur: NEGATIVE
Protein, ur: NEGATIVE
Specific Gravity, Urine: 1.01
Specific Gravity, Urine: 1.01
Urobilinogen, UA: 1
Urobilinogen, UA: 2 — ABNORMAL HIGH
pH: 7
pH: 7.5

## 2011-02-12 LAB — POCT I-STAT CREATININE
Creatinine, Ser: 0.8
Creatinine, Ser: 0.8
Creatinine, Ser: 1
Operator id: 257131
Operator id: 257131
Operator id: 294341

## 2011-02-12 LAB — URINE MICROSCOPIC-ADD ON

## 2011-02-12 LAB — PREGNANCY, URINE
Preg Test, Ur: NEGATIVE
Preg Test, Ur: NEGATIVE

## 2011-02-12 LAB — ETHANOL
Alcohol, Ethyl (B): <5
Alcohol, Ethyl (B): <5

## 2011-02-12 LAB — VALPROIC ACID LEVEL
Valproic Acid Lvl: 40.5 — ABNORMAL LOW
Valproic Acid Lvl: 73.7

## 2011-02-13 LAB — URINALYSIS, ROUTINE W REFLEX MICROSCOPIC
Bilirubin Urine: NEGATIVE
Glucose, UA: NEGATIVE
Hgb urine dipstick: NEGATIVE
Ketones, ur: NEGATIVE
Nitrite: NEGATIVE
Protein, ur: NEGATIVE
Specific Gravity, Urine: 1.015
Urobilinogen, UA: 1
pH: 7.5

## 2011-02-13 LAB — CBC
HCT: 35.2 — ABNORMAL LOW
Hemoglobin: 11.4 — ABNORMAL LOW
MCHC: 32.3
MCV: 70 — ABNORMAL LOW
Platelets: 212
RBC: 5.03
RDW: 20 — ABNORMAL HIGH
WBC: 5.8

## 2011-02-13 LAB — BASIC METABOLIC PANEL
BUN: 9
CO2: 27
Calcium: 8.4
Chloride: 105
Creatinine, Ser: 0.69
GFR calc Af Amer: >60
GFR calc non Af Amer: >60
Glucose, Bld: 100 — ABNORMAL HIGH
Potassium: 4
Sodium: 137

## 2011-02-13 LAB — HEPATIC FUNCTION PANEL
ALT: 15
AST: 23
Albumin: 2.8 — ABNORMAL LOW
Alkaline Phosphatase: 52
Bilirubin, Direct: 0.1
Indirect Bilirubin: 0.4
Total Bilirubin: 0.5
Total Protein: 5.8 — ABNORMAL LOW

## 2011-02-13 LAB — GC/CHLAMYDIA PROBE AMP, GENITAL
Chlamydia, DNA Probe: NEGATIVE
GC Probe Amp, Genital: NEGATIVE

## 2011-02-13 LAB — DIFFERENTIAL
Basophils Absolute: 0.1
Basophils Relative: 1
Eosinophils Absolute: 0.2
Eosinophils Relative: 3
Lymphocytes Relative: 28
Lymphs Abs: 1.6
Monocytes Absolute: 0.4
Monocytes Relative: 7
Neutro Abs: 3.5
Neutrophils Relative %: 61

## 2011-02-13 LAB — WET PREP, GENITAL
Clue Cells Wet Prep HPF POC: NONE SEEN
Yeast Wet Prep HPF POC: NONE SEEN

## 2011-02-13 LAB — VALPROIC ACID LEVEL
Valproic Acid Lvl: 105.3 — ABNORMAL HIGH
Valproic Acid Lvl: 14.8 — ABNORMAL LOW

## 2011-02-13 LAB — RAPID URINE DRUG SCREEN, HOSP PERFORMED
Amphetamines: NOT DETECTED
Barbiturates: NOT DETECTED
Benzodiazepines: NOT DETECTED
Cocaine: NOT DETECTED
Opiates: NOT DETECTED
Tetrahydrocannabinol: NOT DETECTED

## 2011-02-13 LAB — T4, FREE: Free T4: 1.06

## 2011-02-13 LAB — PREGNANCY, URINE: Preg Test, Ur: NEGATIVE

## 2011-02-13 LAB — RPR: RPR Ser Ql: NONREACTIVE

## 2011-02-13 LAB — TSH: TSH: 0.908

## 2011-02-13 LAB — TRICYCLICS SCREEN, URINE: TCA Scrn: NOT DETECTED

## 2011-02-17 LAB — URINALYSIS, ROUTINE W REFLEX MICROSCOPIC
Bilirubin Urine: NEGATIVE
Glucose, UA: NEGATIVE
Hgb urine dipstick: NEGATIVE
Ketones, ur: NEGATIVE
Nitrite: NEGATIVE
Protein, ur: NEGATIVE
Specific Gravity, Urine: 1.005
Urobilinogen, UA: 0.2
pH: 7.5

## 2011-02-17 LAB — POCT PREGNANCY, URINE
Operator id: 294501
Preg Test, Ur: NEGATIVE

## 2011-02-18 LAB — URINE MICROSCOPIC-ADD ON

## 2011-02-18 LAB — URINE CULTURE: Colony Count: 60000

## 2011-02-18 LAB — URINALYSIS, ROUTINE W REFLEX MICROSCOPIC
Bilirubin Urine: NEGATIVE
Glucose, UA: NEGATIVE
Hgb urine dipstick: NEGATIVE
Ketones, ur: NEGATIVE
Nitrite: NEGATIVE
Protein, ur: 30 — AB
Specific Gravity, Urine: 1.013
Urobilinogen, UA: 1
pH: 7

## 2011-02-18 LAB — POCT I-STAT, CHEM 8
BUN: 10
Calcium, Ion: 1.21
Chloride: 101
Creatinine, Ser: 1.2
Glucose, Bld: 108 — ABNORMAL HIGH
HCT: 45
Hemoglobin: 15.3 — ABNORMAL HIGH
Potassium: 3.6
Sodium: 140
TCO2: 29

## 2011-02-18 LAB — CBC
HCT: 42.8
Hemoglobin: 14
MCHC: 32.7
MCV: 71.8 — ABNORMAL LOW
Platelets: 169
RBC: 5.97 — ABNORMAL HIGH
RDW: 20 — ABNORMAL HIGH
WBC: 4.6

## 2011-02-18 LAB — DIFFERENTIAL
Basophils Absolute: 0
Basophils Relative: 0
Eosinophils Absolute: 0
Eosinophils Relative: 1
Lymphocytes Relative: 21
Lymphs Abs: 1
Monocytes Absolute: 0.3
Monocytes Relative: 6
Neutro Abs: 3.3
Neutrophils Relative %: 73

## 2011-02-18 LAB — PREGNANCY, URINE: Preg Test, Ur: NEGATIVE

## 2011-02-18 LAB — VALPROIC ACID LEVEL: Valproic Acid Lvl: <10 — ABNORMAL LOW

## 2011-03-07 LAB — RAPID URINE DRUG SCREEN, HOSP PERFORMED
Amphetamines: NOT DETECTED
Barbiturates: NOT DETECTED
Benzodiazepines: NOT DETECTED
Cocaine: NOT DETECTED
Opiates: NOT DETECTED
Tetrahydrocannabinol: NOT DETECTED

## 2011-03-07 LAB — POCT PREGNANCY, URINE
Operator id: 116391
Preg Test, Ur: NEGATIVE

## 2011-06-10 ENCOUNTER — Other Ambulatory Visit: Payer: Self-pay | Admitting: Internal Medicine

## 2011-06-10 DIAGNOSIS — Z1231 Encounter for screening mammogram for malignant neoplasm of breast: Secondary | ICD-10-CM

## 2012-01-27 ENCOUNTER — Ambulatory Visit: Payer: Medicare Other

## 2012-02-27 ENCOUNTER — Ambulatory Visit: Payer: Medicare Other

## 2012-04-01 ENCOUNTER — Ambulatory Visit: Payer: Medicare Other

## 2012-04-30 ENCOUNTER — Encounter (HOSPITAL_COMMUNITY): Payer: Self-pay | Admitting: Emergency Medicine

## 2012-04-30 ENCOUNTER — Observation Stay (HOSPITAL_COMMUNITY)
Admission: AD | Admit: 2012-04-30 | Discharge: 2012-04-30 | Disposition: A | Discharge status: Home / Self Care | Payer: Medicare Other | Source: Ambulatory Visit | Attending: Emergency Medicine | Admitting: Emergency Medicine

## 2012-04-30 ENCOUNTER — Emergency Department (INDEPENDENT_AMBULATORY_CARE_PROVIDER_SITE_OTHER)
Admission: EM | Admit: 2012-04-30 | Discharge: 2012-04-30 | Disposition: A | Discharge status: Home / Self Care | Payer: Medicare Other | Source: Home / Self Care | Attending: Emergency Medicine | Admitting: Emergency Medicine

## 2012-04-30 ENCOUNTER — Emergency Department (HOSPITAL_COMMUNITY): Payer: Medicare Other

## 2012-04-30 DIAGNOSIS — L0390 Cellulitis, unspecified: Secondary | ICD-10-CM

## 2012-04-30 DIAGNOSIS — M25473 Effusion, unspecified ankle: Secondary | ICD-10-CM | POA: Insufficient documentation

## 2012-04-30 DIAGNOSIS — M25476 Effusion, unspecified foot: Secondary | ICD-10-CM | POA: Insufficient documentation

## 2012-04-30 DIAGNOSIS — L0291 Cutaneous abscess, unspecified: Secondary | ICD-10-CM

## 2012-04-30 HISTORY — DX: Unspecified convulsions: R56.9

## 2012-04-30 LAB — CBC WITH DIFFERENTIAL/PLATELET
Basophils Absolute: 0.1 10*3/uL (ref 0.0–0.1)
Basophils Relative: 1 % (ref 0–1)
Eosinophils Absolute: 0.1 10*3/uL (ref 0.0–0.7)
Eosinophils Relative: 2 % (ref 0–5)
HCT: 39.5 % (ref 36.0–46.0)
Hemoglobin: 13.5 g/dL (ref 12.0–15.0)
Lymphocytes Relative: 38 % (ref 12–46)
Lymphs Abs: 2.8 10*3/uL (ref 0.7–4.0)
MCH: 24.8 pg — ABNORMAL LOW (ref 26.0–34.0)
MCHC: 34.2 g/dL (ref 30.0–36.0)
MCV: 72.6 fL — ABNORMAL LOW (ref 78.0–100.0)
Monocytes Absolute: 0.5 10*3/uL (ref 0.1–1.0)
Monocytes Relative: 7 % (ref 3–12)
Neutro Abs: 3.8 10*3/uL (ref 1.7–7.7)
Neutrophils Relative %: 52 % (ref 43–77)
Platelets: 154 10*3/uL (ref 150–400)
RBC: 5.44 MIL/uL — ABNORMAL HIGH (ref 3.87–5.11)
RDW: 18.4 % — ABNORMAL HIGH (ref 11.5–15.5)
WBC: 7.3 10*3/uL (ref 4.0–10.5)

## 2012-04-30 LAB — D-DIMER, QUANTITATIVE: D-Dimer, Quant: 0.36 ug/mL-FEU (ref 0.00–0.48)

## 2012-04-30 MED ORDER — CEPHALEXIN 500 MG PO CAPS: 500.0000 mg | ORAL_CAPSULE | Freq: Three times a day (TID) | ORAL | Status: DC

## 2012-04-30 MED ORDER — TRAMADOL HCL 50 MG PO TABS: 100.0000 mg | ORAL_TABLET | Freq: Three times a day (TID) | ORAL | Status: DC | PRN

## 2012-04-30 MED ORDER — MUPIROCIN 2 % EX OINT: TOPICAL_OINTMENT | Freq: Three times a day (TID) | CUTANEOUS | Status: DC

## 2012-04-30 MED ORDER — SULFAMETHOXAZOLE-TMP DS 800-160 MG PO TABS: 2.0000 | ORAL_TABLET | Freq: Two times a day (BID) | ORAL | Status: DC

## 2012-04-30 NOTE — ED Notes (Addendum)
Patient reports noticing bump on the back of ankle Tuesday night.  Reports bump was painful.  Reports she did not notice swelling.  Reports a friend noticed swelling and encouraged her to get seen.  Denies chest pain , denies sob.  The left ankle is the only joint involved.

## 2012-04-30 NOTE — ED Notes (Signed)
MD at bedside. 

## 2012-04-30 NOTE — ED Provider Notes (Signed)
Chief Complaint  Patient presents with  . Leg Swelling    History of Present Illness:  The patient is a 41 year old female who had a five-day history of swelling of her left ankle. She denies any injury to the area. She has a small crack on the posterior ankle. This has not been draining any pus. The ankle is tender to palpation and painful with weightbearing. She is able to move it. She denies any fever or chills. Right ankle has been normal. She's had no shortness of breath, PND, orthopnea, or chest pain. She denies any pain in the calf radiating up the leg. She is on Depakote and rest for seizures.  Review of Systems:  Other than noted above, the patient denies any of the following symptoms: Systemic:  No fever, chills, sweats, weight gain or loss. Respiratory:  No coughing, wheezing, or shortness of breath. Cardiac:  No chest pain, tightness, pressure or syncope. GI:  No abdominal pain, swelling, distension, nausea, or vomiting. GU:  No dysuria, frequency, or hematuria. Ext:  No joint pain, muscle pain, or weakness. Skin:  No rash or itching. Neuro:  No paresthesias.  PMFSH:  Past medical history, family history, social history, meds, and allergies were reviewed.  Physical Exam:   Vital signs:  BP 125/83  Pulse 83  Temp 97.3 F (36.3 C) (Oral)  Resp 16  SpO2 100%  LMP 04/30/2012 Gen:  Alert, oriented, in no distress. Neck:  No tenderness, adenopathy, or JVD. Lungs:  Breath sounds clear and equal bilaterally.  No rales, rhonchi or wheezes. Heart:  Regular rhythm, no gallops or murmers. Abdomen:  Soft, nontender, no organomegaly or mass. Ext:  She has pitting edema of the ankle itself but not over the dorsum of the foot or the lower leg. The ankle is visibly puffy compared to the right ankle. The right ankle appears normal. There is minimal tenderness to palpation over the anterior joint line or over the medial or lateral malleolar line. She has a tiny crack in the skin of the  posterior ankle overlying the Achilles tendon. This is moderately tender to palpation. There is no purulent drainage. The ankle has a full range of motion with slight pain. There is no calf tenderness and Homans sign is negative. She has not had any distended veins in her legs. Her pedal pulses are full. There is no pain to palpation of the dorsum of the foot or over the MTP joints. Neuro:  Alert and oriented times 3.  No muscle weakness.  Sensation intact to light touch. Skin:  Warm and dry.  No rash or skin lesions.  Labs:   Results for orders placed during the hospital encounter of 04/30/12  CBC WITH DIFFERENTIAL      Component Value Range   WBC 7.3  4.0 - 10.5 K/uL   RBC 5.44 (*) 3.87 - 5.11 MIL/uL   Hemoglobin 13.5  12.0 - 15.0 g/dL   HCT 16.1  09.6 - 04.5 %   MCV 72.6 (*) 78.0 - 100.0 fL   MCH 24.8 (*) 26.0 - 34.0 pg   MCHC 34.2  30.0 - 36.0 g/dL   RDW 40.9 (*) 81.1 - 91.4 %   Platelets 154  150 - 400 K/uL   Neutrophils Relative 52  43 - 77 %   Neutro Abs 3.8  1.7 - 7.7 K/uL   Lymphocytes Relative 38  12 - 46 %   Lymphs Abs 2.8  0.7 - 4.0 K/uL   Monocytes Relative  7  3 - 12 %   Monocytes Absolute 0.5  0.1 - 1.0 K/uL   Eosinophils Relative 2  0 - 5 %   Eosinophils Absolute 0.1  0.0 - 0.7 K/uL   Basophils Relative 1  0 - 1 %   Basophils Absolute 0.1  0.0 - 0.1 K/uL  D-DIMER, QUANTITATIVE      Component Value Range   D-Dimer, Quant 0.36  0.00 - 0.48 ug/mL-FEU     Radiology:  Dg Ankle Complete Left  04/30/2012  *RADIOLOGY REPORT*  Clinical Data: Left ankle swelling.  LEFT ANKLE COMPLETE - 3+ VIEW  Comparison: None.  Findings: Diffuse soft tissue swelling about the ankle.  No underlying bony abnormality.  No fracture, subluxation or dislocation.  IMPRESSION: Diffuse soft tissue swelling.  No acute bony abnormality.   Original Report Authenticated By: Charlett Nose, M.D.    Assessment:  The encounter diagnosis was Cellulitis.  So far we have ruled out DVT and fracture. Differential  diagnosis includes arthritis, cellulitis, tendinitis, or ankle sprain. The patient doesn't have any history of any trauma or injury. It's possible it could be cellulitis with the small break in the skin of the posterior ankle representing the portal of entry. I have asked her return again in 48 hours for recheck.  Plan:   1.  The following meds were prescribed:   New Prescriptions   CEPHALEXIN (KEFLEX) 500 MG CAPSULE    Take 1 capsule (500 mg total) by mouth 3 (three) times daily.   MUPIROCIN OINTMENT (BACTROBAN) 2 %    Apply topically 3 (three) times daily.   SULFAMETHOXAZOLE-TRIMETHOPRIM (BACTRIM DS) 800-160 MG PER TABLET    Take 2 tablets by mouth 2 (two) times daily.   TRAMADOL (ULTRAM) 50 MG TABLET    Take 2 tablets (100 mg total) by mouth every 8 (eight) hours as needed for pain.   2.  The patient was instructed in symptomatic care and handouts were given. 3.  The patient was told to return if becoming worse in any way, for recheck in 48 hours, and given some red flag symptoms that would indicate earlier return.    Reuben Likes, MD 04/30/12 2236

## 2012-04-30 NOTE — ED Notes (Signed)
Patient transported to X-ray Transported to [facility]

## 2012-05-10 ENCOUNTER — Ambulatory Visit: Payer: Medicare Other

## 2012-08-20 ENCOUNTER — Other Ambulatory Visit: Payer: Self-pay

## 2012-08-20 DIAGNOSIS — Z1231 Encounter for screening mammogram for malignant neoplasm of breast: Secondary | ICD-10-CM

## 2012-08-30 ENCOUNTER — Ambulatory Visit
Admission: RE | Admit: 2012-08-30 | Discharge: 2012-08-30 | Disposition: A | Discharge status: Home / Self Care | Payer: Medicare Other | Source: Ambulatory Visit

## 2012-08-30 DIAGNOSIS — Z1231 Encounter for screening mammogram for malignant neoplasm of breast: Secondary | ICD-10-CM

## 2012-09-30 ENCOUNTER — Encounter (HOSPITAL_BASED_OUTPATIENT_CLINIC_OR_DEPARTMENT_OTHER): Payer: Self-pay

## 2012-09-30 ENCOUNTER — Emergency Department (HOSPITAL_BASED_OUTPATIENT_CLINIC_OR_DEPARTMENT_OTHER)
Admission: EM | Admit: 2012-09-30 | Discharge: 2012-09-30 | Disposition: A | Discharge status: Home / Self Care | Payer: Medicare Other | Attending: Emergency Medicine | Admitting: Emergency Medicine

## 2012-09-30 DIAGNOSIS — M545 Low back pain, unspecified: Secondary | ICD-10-CM | POA: Insufficient documentation

## 2012-09-30 DIAGNOSIS — Z9889 Other specified postprocedural states: Secondary | ICD-10-CM | POA: Insufficient documentation

## 2012-09-30 DIAGNOSIS — Z79899 Other long term (current) drug therapy: Secondary | ICD-10-CM | POA: Insufficient documentation

## 2012-09-30 DIAGNOSIS — Z3202 Encounter for pregnancy test, result negative: Secondary | ICD-10-CM | POA: Insufficient documentation

## 2012-09-30 DIAGNOSIS — Z8669 Personal history of other diseases of the nervous system and sense organs: Secondary | ICD-10-CM | POA: Insufficient documentation

## 2012-09-30 LAB — URINALYSIS, ROUTINE W REFLEX MICROSCOPIC
Bilirubin Urine: NEGATIVE
Glucose, UA: NEGATIVE mg/dL
Hgb urine dipstick: NEGATIVE
Ketones, ur: NEGATIVE mg/dL
Leukocytes, UA: NEGATIVE
Nitrite: NEGATIVE
Protein, ur: NEGATIVE mg/dL
Specific Gravity, Urine: 1.008 (ref 1.005–1.030)
Urobilinogen, UA: 0.2 mg/dL (ref 0.0–1.0)
pH: 6.5 (ref 5.0–8.0)

## 2012-09-30 LAB — PREGNANCY, URINE: Preg Test, Ur: NEGATIVE

## 2012-09-30 MED ORDER — TRAMADOL HCL 50 MG PO TABS: 100.0000 mg | ORAL_TABLET | Freq: Three times a day (TID) | ORAL | Status: DC | PRN

## 2012-09-30 MED ORDER — IBUPROFEN 800 MG PO TABS: 800.0000 mg | ORAL_TABLET | Freq: Three times a day (TID) | ORAL | Status: DC | PRN

## 2012-09-30 NOTE — ED Provider Notes (Signed)
History     CSN: [id number]  Arrival date & time 09/30/12  4098   First MD Initiated Contact with Patient 09/30/12 [phone]      Chief Complaint  Patient presents with  . Flank Pain    (Consider location/radiation/quality/duration/timing/severity/associated sxs/prior treatment) Patient is a 42 y.o. female presenting with flank pain.  Flank Pain   Pt brought to the ED from [facility] where she is being treated for polysubstance abuse (EtOH, marijuana and cocaine) for evaluation of R lower back pain. Pain is severe aching and worse with movement. No prior history of back problems. No falls or injuries. No fever, vomiting. She reports some incomplete voiding at times, but no dysuria. Pain does not radiate into buttocks or legs. Take ibuprofen without improvement.   Past Medical History  Diagnosis Date  . Seizures     Past Surgical History  Procedure Laterality Date  . Back surgery      No family history on file.  History  Substance Use Topics  . Smoking status: Never Smoker   . Smokeless tobacco: Not on file  . Alcohol Use: Yes    OB History   Grav Para Term Preterm Abortions TAB SAB Ect Mult Living                  Review of Systems  Genitourinary: Positive for flank pain.   All other systems reviewed and are negative except as noted in HPI.   Allergies  Review of patient's allergies indicates no known allergies.  Home Medications   Current Outpatient Rx  Name  Route  Sig  Dispense  Refill  . cephALEXin (KEFLEX) 500 MG capsule   Oral   Take 1 capsule (500 mg total) by mouth 3 (three) times daily.   30 capsule   0   . divalproex (DEPAKOTE) 500 MG DR tablet   Oral   Take 750 mg by mouth at bedtime.          . Multiple Vitamin (MULTIVITAMIN) capsule   Oral   Take 1 capsule by mouth daily.         . mupirocin ointment (BACTROBAN) 2 %   Topical   Apply topically 3 (three) times daily.   22 g   0   . risperiDONE (RISPERDAL) 1 MG tablet   Oral   Take  1 mg by mouth at bedtime.          . sulfamethoxazole-trimethoprim (BACTRIM DS) 800-160 MG per tablet   Oral   Take 2 tablets by mouth 2 (two) times daily.   40 tablet   0   . traMADol (ULTRAM) 50 MG tablet   Oral   Take 2 tablets (100 mg total) by mouth every 8 (eight) hours as needed for pain.   30 tablet   0     BP 119/79  Pulse 83  Temp(Src) 97.9 F (36.6 C)  Resp 16  SpO2 100%  LMP 09/12/2012  Physical Exam  Nursing note and vitals reviewed. Constitutional: She is oriented to person, place, and time. She appears well-developed and well-nourished.  HENT:  Head: Normocephalic and atraumatic.  Eyes: EOM are normal. Pupils are equal, round, and reactive to light.  Neck: Normal range of motion. Neck supple.  Cardiovascular: Normal rate, normal heart sounds and intact distal pulses.   Pulmonary/Chest: Effort normal and breath sounds normal.  Abdominal: Bowel sounds are normal. She exhibits no distension. There is no tenderness. There is no rebound.  Musculoskeletal: Normal range  of motion. She exhibits tenderness (R lumbar paraspinal muscles). She exhibits no edema.  Neurological: She is alert and oriented to person, place, and time. She has normal strength. She displays normal reflexes. No cranial nerve deficit or sensory deficit.  Skin: Skin is warm and dry. No rash noted.  Psychiatric: She has a normal mood and affect.    ED Course  Procedures (including critical care time)  Labs Reviewed  URINALYSIS, ROUTINE W REFLEX MICROSCOPIC - Abnormal; Notable for the following:    APPearance CLOUDY (*)    All other components within normal limits  PREGNANCY, URINE   No results found.   1. Low back pain       MDM  UA is clear, will give Tramadol as this is the extent of meds allowed at [facility]. PCP follow up for low back pain. No Red Flags.         Debbie Yearick B. Bernette Mayers, MD 09/30/12 [phone]

## 2012-09-30 NOTE — ED Notes (Signed)
Pt complains of right flank pain but denies urinary frequency or burning. Pt states "when I go to bathroom its like I'm not finished, like a pressure."

## 2012-10-01 ENCOUNTER — Emergency Department (HOSPITAL_BASED_OUTPATIENT_CLINIC_OR_DEPARTMENT_OTHER)
Admission: EM | Admit: 2012-10-01 | Discharge: 2012-10-01 | Disposition: A | Discharge status: Home / Self Care | Payer: Medicare Other | Attending: Emergency Medicine | Admitting: Emergency Medicine

## 2012-10-01 ENCOUNTER — Encounter (HOSPITAL_BASED_OUTPATIENT_CLINIC_OR_DEPARTMENT_OTHER): Payer: Self-pay

## 2012-10-01 DIAGNOSIS — Z8669 Personal history of other diseases of the nervous system and sense organs: Secondary | ICD-10-CM | POA: Insufficient documentation

## 2012-10-01 DIAGNOSIS — F1010 Alcohol abuse, uncomplicated: Secondary | ICD-10-CM | POA: Insufficient documentation

## 2012-10-01 DIAGNOSIS — Z9889 Other specified postprocedural states: Secondary | ICD-10-CM | POA: Insufficient documentation

## 2012-10-01 DIAGNOSIS — M549 Dorsalgia, unspecified: Secondary | ICD-10-CM | POA: Insufficient documentation

## 2012-10-01 DIAGNOSIS — Z79899 Other long term (current) drug therapy: Secondary | ICD-10-CM | POA: Insufficient documentation

## 2012-10-01 HISTORY — DX: Alcohol abuse, uncomplicated: F10.10

## 2012-10-01 MED ORDER — METHOCARBAMOL 500 MG PO TABS: 500.0000 mg | ORAL_TABLET | Freq: Two times a day (BID) | ORAL | Status: DC

## 2012-10-01 MED ORDER — KETOROLAC TROMETHAMINE 60 MG/2ML IM SOLN
60.0000 mg | Freq: Once | INTRAMUSCULAR | Status: AC
  Administered 2012-10-01: 60 mg via INTRAMUSCULAR
  Filled 2012-10-01: qty 2

## 2012-10-01 NOTE — ED Notes (Signed)
Patient states she has a two- three week history of mid to lower back.  Was seen yesterday and given tramadol and ibuprofen which she states is not working.

## 2012-10-01 NOTE — ED Provider Notes (Signed)
History     CSN: [id number]  Arrival date & time 10/01/12  1135   First MD Initiated Contact with Patient 10/01/12 1208      Chief Complaint  Patient presents with  . Back Pain    (Consider location/radiation/quality/duration/timing/severity/associated sxs/prior treatment) Patient is a 42 y.o. female presenting with back pain. The history is provided by the patient. No language interpreter was used.  Back Pain Location:  Lumbar spine Quality:  Aching Pain severity:  Severe Pain is:  Same all the time Onset quality:  Gradual Timing:  Constant Progression:  Worsening Chronicity:  New Relieved by:  Nothing Pt was seen here yesterday for the same.  Pt complains of soreness in her back.  Pt reports no relief with tramadol  Past Medical History  Diagnosis Date  . Seizures   . Alcohol abuse     Past Surgical History  Procedure Laterality Date  . Back surgery      No family history on file.  History  Substance Use Topics  . Smoking status: Never Smoker   . Smokeless tobacco: Not on file  . Alcohol Use: Yes     Comment: presently in [facility] for recovery    OB History   Grav Para Term Preterm Abortions TAB SAB Ect Mult Living                  Review of Systems  Musculoskeletal: Positive for back pain.  All other systems reviewed and are negative.    Allergies  Review of patient's allergies indicates no known allergies.  Home Medications   Current Outpatient Rx  Name  Route  Sig  Dispense  Refill  . Divalproex Sodium (DEPAKOTE ER PO)   Oral   Take 2,250 mg by mouth at bedtime.         . risperiDONE (RISPERDAL) 1 MG tablet   Oral   Take 1 mg by mouth at bedtime.         Marland Kitchen ibuprofen (ADVIL,MOTRIN) 800 MG tablet   Oral   Take 1 tablet (800 mg total) by mouth every 8 (eight) hours as needed for pain.   30 tablet   0   . traMADol (ULTRAM) 50 MG tablet   Oral   Take 2 tablets (100 mg total) by mouth every 8 (eight) hours as needed for pain.   30  tablet   0     BP 118/77  Temp(Src) 97.8 F (36.6 C) (Oral)  Resp 20  Ht 5\' 1"  (1.549 m)  Wt 147 lb (66.679 kg)  BMI 27.79 kg/m2  SpO2 96%  LMP 09/12/2012  Physical Exam  Nursing note and vitals reviewed. Constitutional: She is oriented to person, place, and time. She appears well-developed and well-nourished.  HENT:  Head: Normocephalic and atraumatic.  Eyes: Conjunctivae and EOM are normal. Pupils are equal, round, and reactive to light.  Neck: Normal range of motion. Neck supple.  Cardiovascular: Normal rate.   Pulmonary/Chest: Effort normal.  Abdominal: Soft.  Musculoskeletal: She exhibits tenderness.  Tender upper thoracic back,  Pain with range of motion  Neurological: She is alert and oriented to person, place, and time.  Skin: Skin is warm.  Psychiatric: She has a normal mood and affect.    ED Course  Procedures (including critical care time)  Labs Reviewed - No data to display No results found.   1. Back pain       MDM  Pt given torodol IM,   I will add  robaxin         Elson Areas, PA-C 10/01/12 1224  Lonia Skinner [place], PA-C 10/01/12 1226

## 2012-10-01 NOTE — ED Notes (Signed)
Pt reports lower back pain that increases with movement and palpation.  Pt was seen here yesterday for the same and states the medications isn't effective. Denies weakness in extremities.

## 2012-10-01 NOTE — ED Provider Notes (Signed)
Medical screening examination/treatment/procedure(s) were performed by non-physician practitioner and as supervising physician I was immediately available for consultation/collaboration.  Lyanne Co, MD 10/01/12 1537

## 2013-01-15 ENCOUNTER — Encounter (HOSPITAL_COMMUNITY): Payer: Self-pay | Admitting: Emergency Medicine

## 2013-01-15 ENCOUNTER — Emergency Department (INDEPENDENT_AMBULATORY_CARE_PROVIDER_SITE_OTHER)
Admission: EM | Admit: 2013-01-15 | Discharge: 2013-01-15 | Disposition: A | Discharge status: Home / Self Care | Payer: Medicare Other | Source: Home / Self Care

## 2013-01-15 DIAGNOSIS — R300 Dysuria: Secondary | ICD-10-CM

## 2013-01-15 LAB — POCT PREGNANCY, URINE: Preg Test, Ur: NEGATIVE

## 2013-01-15 MED ORDER — FLUCONAZOLE 150 MG PO TABS: 150.0000 mg | ORAL_TABLET | Freq: Once | ORAL | Status: DC

## 2013-01-15 MED ORDER — CEPHALEXIN 500 MG PO CAPS: 500.0000 mg | ORAL_CAPSULE | Freq: Two times a day (BID) | ORAL | Status: DC

## 2013-01-15 NOTE — ED Provider Notes (Signed)
Melissa Dougherty is a 42 y.o. female who presents to Urgent Care today for dysuria present for about 3 weeks. Patient's symptoms are consistent with prior previous UTI. She has positive urinary urgency and frequency and burning with urination. She denies any vaginal discharge nausea vomiting or diarrhea fevers or chills. She has not tried any medications for this. She feels well otherwise.  PMH reviewed. Seizure disorder, alcohol abuse history History  Substance Use Topics  . Smoking status: Never Smoker   . Smokeless tobacco: Not on file  . Alcohol Use: Yes     Comment: presently in [facility] for recovery   ROS as above Medications reviewed. No current facility-administered medications for this encounter.   Current Outpatient Prescriptions  Medication Sig Dispense Refill  . Divalproex Sodium (DEPAKOTE ER PO) Take 2,250 mg by mouth at bedtime.      . risperiDONE (RISPERDAL) 1 MG tablet Take 1 mg by mouth at bedtime.      . cephALEXin (KEFLEX) 500 MG capsule Take 1 capsule (500 mg total) by mouth 2 (two) times daily.  14 capsule  0  . fluconazole (DIFLUCAN) 150 MG tablet Take 1 tablet (150 mg total) by mouth once.  1 tablet  1  . ibuprofen (ADVIL,MOTRIN) 800 MG tablet Take 1 tablet (800 mg total) by mouth every 8 (eight) hours as needed for pain.  30 tablet  0  . methocarbamol (ROBAXIN) 500 MG tablet Take 1 tablet (500 mg total) by mouth 2 (two) times daily.  20 tablet  0  . traMADol (ULTRAM) 50 MG tablet Take 2 tablets (100 mg total) by mouth every 8 (eight) hours as needed for pain.  30 tablet  0    Exam:  BP 126/84  Pulse 85  Temp(Src) 97.4 F (36.3 C) (Oral)  Resp 16  SpO2 97%  LMP 01/08/2013 Gen: Well NAD HEENT: EOMI,  MMM Lungs: CTABL Nl WOB Heart: RRR no MRG Abd: NABS, NT, ND Exts: Non edematous BL  LE, warm and well perfused.  Urinalysis was normal Results for orders placed during the hospital encounter of 01/15/13 (from the past 24 hour(s))  POCT PREGNANCY, URINE      Status: None   Collection Time    01/15/13  1:01 PM      Result Value Range   Preg Test, Ur NEGATIVE  NEGATIVE   No results found.  Assessment and Plan: 42 y.o. female with unclear cause of dysuria.  Plan to culture the urine.  Plan to treat empirically with Keflex and fluconazole.  Followup as needed Discussed warning signs or symptoms. Please see discharge instructions. Patient expresses understanding.      Rodolph Bong, MD 01/15/13 [phone]

## 2013-01-15 NOTE — ED Notes (Signed)
Pt c/o UTI sxs onset 3 weeks Sxs include: abd/back pain, urine freq/urgency, dysuria, hematuria Denies: fevers Alert w/no signs of acute distress.

## 2013-01-15 NOTE — ED Notes (Signed)
Urine results not crossing into computer;  Dr Denyse Amass give print out of results

## 2013-01-17 LAB — URINE CULTURE: Colony Count: 60000

## 2013-02-09 ENCOUNTER — Other Ambulatory Visit: Payer: Self-pay | Admitting: Gastroenterology

## 2013-03-18 ENCOUNTER — Emergency Department (INDEPENDENT_AMBULATORY_CARE_PROVIDER_SITE_OTHER)
Admission: EM | Admit: 2013-03-18 | Discharge: 2013-03-18 | Disposition: A | Discharge status: Home / Self Care | Payer: Medicare HMO | Source: Home / Self Care | Attending: Family Medicine | Admitting: Family Medicine

## 2013-03-18 ENCOUNTER — Encounter (HOSPITAL_COMMUNITY): Payer: Self-pay | Admitting: Emergency Medicine

## 2013-03-18 ENCOUNTER — Emergency Department (INDEPENDENT_AMBULATORY_CARE_PROVIDER_SITE_OTHER): Payer: Medicare HMO

## 2013-03-18 DIAGNOSIS — J040 Acute laryngitis: Secondary | ICD-10-CM

## 2013-03-18 LAB — POCT RAPID STREP A: Streptococcus, Group A Screen (Direct): NEGATIVE

## 2013-03-18 MED ORDER — HYDROCODONE-ACETAMINOPHEN 5-325 MG PO TABS: 0.5000 | ORAL_TABLET | Freq: Every evening | ORAL | Status: DC | PRN

## 2013-03-18 MED ORDER — METHYLPREDNISOLONE (PAK) 4 MG PO TABS: ORAL_TABLET | ORAL | Status: DC

## 2013-03-18 NOTE — ED Notes (Signed)
C/o sore throat , loss of voice and bilateral ear pain x 3 days. Pt has used ibuprofen for pain with mild relief. Denies fever, n/v.  Mild case of diarrhea which has subsided.

## 2013-03-18 NOTE — ED Provider Notes (Signed)
Melissa Dougherty is a 42 y.o. female who presents to Urgent Care today for sore throat hoarse voice and bilateral ear pain starting yesterday. Patient notes a bit of wheezing as well. She denies any trouble breathing or chest pain significant cough or nasal discharge. She denies any fevers or chills nausea or vomiting. She has not tried any medications yet. She feels well otherwise   Past Medical History  Diagnosis Date  . Seizures   . Alcohol abuse    History  Substance Use Topics  . Smoking status: Never Smoker   . Smokeless tobacco: Not on file  . Alcohol Use: Yes     Comment: presently in [facility] for recovery   ROS as above Medications reviewed. No current facility-administered medications for this encounter.   Current Outpatient Prescriptions  Medication Sig Dispense Refill  . Divalproex Sodium (DEPAKOTE ER PO) Take 2,250 mg by mouth at bedtime.      . risperiDONE (RISPERDAL) 1 MG tablet Take 1 mg by mouth at bedtime.      Marland Kitchen HYDROcodone-acetaminophen (NORCO/VICODIN) 5-325 MG per tablet Take 0.5 tablets by mouth at bedtime as needed for pain (cough).  6 tablet  0  . ibuprofen (ADVIL,MOTRIN) 800 MG tablet Take 1 tablet (800 mg total) by mouth every 8 (eight) hours as needed for pain.  30 tablet  0  . methylPREDNIsolone (MEDROL DOSPACK) 4 MG tablet follow package directions  21 tablet  0    Exam:  BP 127/80  Pulse 84  Temp(Src) 97.4 F (36.3 C) (Oral)  Resp 18  SpO2 100%  LMP 03/11/2013 Gen: Well NAD HEENT: EOMI,  MMM, normal-appearing tympanic membranes bilaterally. Posterior pharynx with mild erythema Lungs: Normal work of breathing. Rhonchorous breath sounds present bilaterally Heart: RRR no MRG Abd: NABS, NT, ND Exts: Non edematous BL  LE, warm and well perfused.   Results for orders placed during the hospital encounter of 03/18/13 (from the past 24 hour(s))  POCT RAPID STREP A ([facility] ONLY)     Status: None   Collection Time    03/18/13 11:48 AM      Result  Value Range   Streptococcus, Group A Screen (Direct) NEGATIVE  NEGATIVE   Dg Chest 2 View  03/18/2013   CLINICAL DATA:  Shortness of Breath  EXAM: CHEST  2 VIEW  COMPARISON:  None.  FINDINGS: There is no edema or consolidation. The heart size and pulmonary vascularity are normal. No adenopathy. There is thoracic levoscoliosis. There is old rib trauma on the right. There is postoperative change at the gastroesophageal junction. Incidental note is made of an apparent nipple shadow on the right.  IMPRESSION: No edema or consolidation.   Electronically Signed   By: Bretta Bang M.D.   On: 03/18/2013 12:46    Assessment and Plan: 42 y.o. female with laryngitis. Plan to treat with Depo-Medrol, and codeine containing cough medication. Will use Tylenol or ibuprofen as needed. Followup with primary care provider. Discussed warning signs or symptoms. Please see discharge instructions. Patient expresses understanding.      Rodolph Bong, MD 03/18/13 1320

## 2013-03-20 LAB — CULTURE, GROUP A STREP

## 2013-05-26 ENCOUNTER — Emergency Department (INDEPENDENT_AMBULATORY_CARE_PROVIDER_SITE_OTHER)
Admission: EM | Admit: 2013-05-26 | Discharge: 2013-05-26 | Disposition: A | Discharge status: Home / Self Care | Payer: Medicare HMO | Source: Home / Self Care | Attending: Emergency Medicine | Admitting: Emergency Medicine

## 2013-05-26 ENCOUNTER — Encounter (HOSPITAL_COMMUNITY): Payer: Self-pay | Admitting: Emergency Medicine

## 2013-05-26 ENCOUNTER — Other Ambulatory Visit (HOSPITAL_COMMUNITY)
Admission: RE | Admit: 2013-05-26 | Discharge: 2013-05-26 | Disposition: A | Discharge status: Home / Self Care | Payer: Medicare HMO | Source: Ambulatory Visit | Attending: Emergency Medicine | Admitting: Emergency Medicine

## 2013-05-26 DIAGNOSIS — N3000 Acute cystitis without hematuria: Secondary | ICD-10-CM

## 2013-05-26 DIAGNOSIS — N760 Acute vaginitis: Secondary | ICD-10-CM | POA: Insufficient documentation

## 2013-05-26 DIAGNOSIS — Z113 Encounter for screening for infections with a predominantly sexual mode of transmission: Secondary | ICD-10-CM | POA: Insufficient documentation

## 2013-05-26 DIAGNOSIS — K5900 Constipation, unspecified: Secondary | ICD-10-CM

## 2013-05-26 LAB — POCT URINALYSIS DIP (DEVICE)
Bilirubin Urine: NEGATIVE
Glucose, UA: NEGATIVE mg/dL
Ketones, ur: NEGATIVE mg/dL
Leukocytes, UA: NEGATIVE
Nitrite: NEGATIVE
Protein, ur: NEGATIVE mg/dL
Specific Gravity, Urine: 1.02 (ref 1.005–1.030)
Urobilinogen, UA: 4 mg/dL — ABNORMAL HIGH (ref 0.0–1.0)
pH: 7 (ref 5.0–8.0)

## 2013-05-26 LAB — POCT PREGNANCY, URINE: Preg Test, Ur: NEGATIVE

## 2013-05-26 MED ORDER — CEPHALEXIN 500 MG PO CAPS: 500.0000 mg | ORAL_CAPSULE | Freq: Three times a day (TID) | ORAL | Status: DC

## 2013-05-26 MED ORDER — POLYETHYLENE GLYCOL 3350 17 GM/SCOOP PO POWD: 17.0000 g | Freq: Every day | ORAL | Status: DC

## 2013-05-26 NOTE — ED Provider Notes (Signed)
Chief Complaint:   Chief Complaint  Patient presents with  . Hematuria    History of Present Illness:    Melissa Dougherty is a 43 year old female who has had a four-day history of lower abdominal pain and bloating after meals. She has been constipated for years. She had a bowel movement yesterday. Last bowel movement before this was about a week ago. Patient states her bowel movements are about a week apart. They're hard, bulky, pellet-like, and painful. She has to strain. There's been no blood in the stool. Since last night she's had blood in her urine and some urgency. She denies any dysuria or frequency. She has had urinary tract infections in the past. She denies any fever, chills, nausea, or vomiting. She's had no vaginal discharge or itching. Her menses have been irregular. Last menstrual period was December 16. She is sexually active but has had a bilateral tubal ligation.  Review of Systems:  Other than noted above, the patient denies any of the following symptoms: Constitutional:  No fever, chills, fatigue, weight loss or anorexia. Lungs:  No cough or shortness of breath. Heart:  No chest pain, palpitations, syncope or edema.  No cardiac history. Abdomen:  No nausea, vomiting, hematememesis, melena, diarrhea, or hematochezia. GU:  No dysuria, frequency, urgency, or hematuria. Gyn:  No vaginal discharge, itching, abnormal bleeding, dyspareunia, or pelvic pain.  [place]:  Past medical history, family history, social history, meds, and allergies were reviewed along with nurse's notes.  No prior abdominal surgeries, past history of GI problems, STDs or GYN problems.  No history of aspirin or NSAID use.  No excessive alcohol intake. The patient has an esophageal stricture and had dilatation in September of 2014. She is seeing Dr. Samara Snide. She also has a history of seizures. Last seizure was in 1996. She takes Risperdal and Depakote. The patient had problems as a neonate, and had a feeding tube placed  by Dr. Blase Mess.  Physical Exam:   Vital signs:  BP 112/76  Pulse 85  Temp(Src) 97.6 F (36.4 C) (Oral)  Resp 18  SpO2 100% Gen:  Alert, oriented, in no distress. Lungs:  Breath sounds clear and equal bilaterally.  No wheezes, rales or rhonchi. Heart:  Regular rhythm.  No gallops or murmers.   Abdomen:  She has extensive scarring on her abdomen from her previous surgeries. There is mild tenderness to palpation across the entire lower abdomen without guarding or rebound. No organomegaly or mass. Bowel sounds are normally active. Pelvic:  Normal external genitalia. Vaginal and cervical mucosa are normal. There is a brownish discharge. This is non-malodorous. There was mild pain on cervical motion. Uterus is normal in size and shape and nontender. She has mild bilateral adnexal tenderness without masses. DNA probes for gonorrhea, Chlamydia, Trichomonas, Gardnerella, Candida were obtained. Skin:  Clear, warm and dry.  No rash.  Labs:   Results for orders placed during the hospital encounter of 05/26/13  POCT URINALYSIS DIP (DEVICE)      Result Value Range   Glucose, UA NEGATIVE  NEGATIVE mg/dL   Bilirubin Urine NEGATIVE  NEGATIVE   Ketones, ur NEGATIVE  NEGATIVE mg/dL   Specific Gravity, Urine 1.020  1.005 - 1.030   Hgb urine dipstick TRACE (*) NEGATIVE   pH 7.0  5.0 - 8.0   Protein, ur NEGATIVE  NEGATIVE mg/dL   Urobilinogen, UA 4.0 (*) 0.0 - 1.0 mg/dL   Nitrite NEGATIVE  NEGATIVE   Leukocytes, UA NEGATIVE  NEGATIVE  POCT PREGNANCY,  URINE      Result Value Range   Preg Test, Ur NEGATIVE  NEGATIVE    The urine was cultured.  Assessment:  The primary encounter diagnosis was Acute cystitis. A diagnosis of Constipated was also pertinent to this visit.  Plan:   1.  Meds:  The following meds were prescribed:   Discharge Medication List as of 05/26/2013  5:11 PM    START taking these medications   Details  cephALEXin (KEFLEX) 500 MG capsule Take 1 capsule (500 mg total) by mouth 3  (three) times daily., Starting 05/26/2013, Until Discontinued, Normal    polyethylene glycol powder (MIRALAX) powder Take 17 g by mouth daily., Starting 05/26/2013, Until Discontinued, Normal        2.  Patient Education/Counseling:  The patient was given appropriate handouts, self care instructions, and instructed in symptomatic relief.  Discussed proper bowel habits including getting extra fluids, fiber, exercise.  3.  Follow up:  The patient was told to follow up if no better in 3 to 4 days, if becoming worse in any way, and given some red flag symptoms such as fever persistent vomiting which would prompt immediate return.  Follow up here if necessary.    Harden Mo, MD 05/26/13 [phone]

## 2013-05-26 NOTE — ED Notes (Signed)
Reported blood in UA

## 2013-05-26 NOTE — Discharge Instructions (Signed)
Constipation, Adult Constipation is when a person has fewer than 3 bowel movements a week; has difficulty having a bowel movement; or has stools that are dry, hard, or larger than normal. As people grow older, constipation is more common. If you try to fix constipation with medicines that make you have a bowel movement (laxatives), the problem may get worse. Long-term laxative use may cause the muscles of the colon to become weak. A low-fiber diet, not taking in enough fluids, and taking certain medicines may make constipation worse. CAUSES   Certain medicines, such as antidepressants, pain medicine, iron supplements, antacids, and water pills.   Certain diseases, such as diabetes, irritable bowel syndrome (IBS), thyroid disease, or depression.   Not drinking enough water.   Not eating enough fiber-rich foods.   Stress or travel.  Lack of physical activity or exercise.  Not going to the restroom when there is the urge to have a bowel movement.  Ignoring the urge to have a bowel movement.  Using laxatives too much. SYMPTOMS   Having fewer than 3 bowel movements a week.   Straining to have a bowel movement.   Having hard, dry, or larger than normal stools.   Feeling full or bloated.   Pain in the lower abdomen.  Not feeling relief after having a bowel movement. DIAGNOSIS  Your caregiver will take a medical history and perform a physical exam. Further testing may be done for severe constipation. Some tests may include:   A barium enema X-ray to examine your rectum, colon, and sometimes, your small intestine.  A sigmoidoscopy to examine your lower colon.  A colonoscopy to examine your entire colon. TREATMENT  Treatment will depend on the severity of your constipation and what is causing it. Some dietary treatments include drinking more fluids and eating more fiber-rich foods. Lifestyle treatments may include regular exercise. If these diet and lifestyle recommendations  do not help, your caregiver may recommend taking over-the-counter laxative medicines to help you have bowel movements. Prescription medicines may be prescribed if over-the-counter medicines do not work.  HOME CARE INSTRUCTIONS   Increase dietary fiber in your diet, such as fruits, vegetables, whole grains, and beans. Limit high-fat and processed sugars in your diet, such as [place] fries, hamburgers, cookies, candies, and soda.   A fiber supplement may be added to your diet if you cannot get enough fiber from foods.   Drink enough fluids to keep your urine clear or pale yellow.   Exercise regularly or as directed by your caregiver.   Go to the restroom when you have the urge to go. Do not hold it.  Only take medicines as directed by your caregiver. Do not take other medicines for constipation without talking to your caregiver first. [place] IF:   You have bright red blood in your stool.   Your constipation lasts for more than 4 days or gets worse.   You have abdominal or rectal pain.   You have thin, pencil-like stools.  You have unexplained weight loss. MAKE SURE YOU:   Understand these instructions.  Will watch your condition.  Will get help right away if you are not doing well or get worse. Document Released: 02/08/2004 Document Revised: 08/04/2011 Document Reviewed: 04/15/2011 [facility] Patient Information 2014 [place], [place].  Urinary Tract Infection Urinary tract infections (UTIs) can develop anywhere along your urinary tract. Your urinary tract is your body's drainage system for removing wastes and extra water. Your urinary tract includes two kidneys, two  ureters, a bladder, and a urethra. Your kidneys are a pair of bean-shaped organs. Each kidney is about the size of your fist. They are located below your ribs, one on each side of your spine. CAUSES Infections are caused by microbes, which are microscopic organisms, including fungi, viruses, and  bacteria. These organisms are so small that they can only be seen through a microscope. Bacteria are the microbes that most commonly cause UTIs. SYMPTOMS  Symptoms of UTIs may vary by age and gender of the patient and by the location of the infection. Symptoms in young women typically include a frequent and intense urge to urinate and a painful, burning feeling in the bladder or urethra during urination. Older women and men are more likely to be tired, shaky, and weak and have muscle aches and abdominal pain. A fever may mean the infection is in your kidneys. Other symptoms of a kidney infection include pain in your back or sides below the ribs, nausea, and vomiting. DIAGNOSIS To diagnose a UTI, your caregiver will ask you about your symptoms. Your caregiver also will ask to provide a urine sample. The urine sample will be tested for bacteria and white blood cells. White blood cells are made by your body to help fight infection. TREATMENT  Typically, UTIs can be treated with medication. Because most UTIs are caused by a bacterial infection, they usually can be treated with the use of antibiotics. The choice of antibiotic and length of treatment depend on your symptoms and the type of bacteria causing your infection. HOME CARE INSTRUCTIONS  If you were prescribed antibiotics, take them exactly as your caregiver instructs you. Finish the medication even if you feel better after you have only taken some of the medication.  Drink enough water and fluids to keep your urine clear or pale yellow.  Avoid caffeine, tea, and carbonated beverages. They tend to irritate your bladder.  Empty your bladder often. Avoid holding urine for long periods of time.  Empty your bladder before and after sexual intercourse.  After a bowel movement, women should cleanse from front to back. Use each tissue only once. SEEK MEDICAL CARE IF:   You have back pain.  You develop a fever.  Your symptoms do not begin to  resolve within 3 days. SEEK IMMEDIATE MEDICAL CARE IF:   You have severe back pain or lower abdominal pain.  You develop chills.  You have nausea or vomiting.  You have continued burning or discomfort with urination. MAKE SURE YOU:   Understand these instructions.  Will watch your condition.  Will get help right away if you are not doing well or get worse. Document Released: 02/19/2005 Document Revised: 11/11/2011 Document Reviewed: 06/20/2011 [facility] Patient Information 2014 [place].

## 2013-05-26 NOTE — ED Notes (Signed)
Call back number for lab issues verified 

## 2013-05-27 LAB — URINE CULTURE
Colony Count: NO GROWTH
Culture: NO GROWTH

## 2013-05-28 NOTE — ED Notes (Signed)
1/2  GC/Chlamydia neg., Affirm: Candida and Trich neg., Gardnerella pos., Urine culture: No growth.  Message sent to Dr. Jake Michaelis. Roselyn Meier 05/28/2013

## 2013-05-30 ENCOUNTER — Telehealth (HOSPITAL_COMMUNITY): Payer: Self-pay | Admitting: Emergency Medicine

## 2013-05-30 MED ORDER — METRONIDAZOLE 500 MG PO TABS: 500.0000 mg | ORAL_TABLET | Freq: Two times a day (BID) | ORAL | Status: DC

## 2013-05-30 NOTE — ED Notes (Signed)
Her DNA probe was positive for Gardnerella. All others are negative. Her urine culture was negative. She will need treatment for the Gardnerella with metronidazole 500 mg twice a day for one week. This will be sent to her pharmacy, [facility] and will need to call to inform the patient.  Harden Mo, MD 05/30/13 [phone]

## 2013-05-31 ENCOUNTER — Telehealth (HOSPITAL_COMMUNITY): Payer: Self-pay | Admitting: *Deleted

## 2013-05-31 NOTE — ED Notes (Signed)
Dr. Jake Michaelis e-prescribed Flagyl. I called pt. but phone number is not valid.  I called contact's number and she said she is the POA. She gave me pt.'s number as [phone].  I told her we had [phone] and thanked her.  I corrected pt.'s number in the system and called pt. Pt. verified x 2 and given results.  Pt. told she needs Flagyl for bacterial vaginosis and where to pick up her Rx.   Pt. instructed to no alcohol while taking this medication.  Pt. voiced understanding. Roselyn Meier 05/31/2013

## 2013-06-17 ENCOUNTER — Emergency Department (HOSPITAL_COMMUNITY): Payer: Medicare HMO

## 2013-06-17 ENCOUNTER — Emergency Department (HOSPITAL_COMMUNITY)
Admission: EM | Admit: 2013-06-17 | Discharge: 2013-06-17 | Disposition: A | Discharge status: Home / Self Care | Payer: Medicare HMO | Attending: Emergency Medicine | Admitting: Emergency Medicine

## 2013-06-17 ENCOUNTER — Encounter (HOSPITAL_COMMUNITY): Payer: Self-pay | Admitting: Emergency Medicine

## 2013-06-17 DIAGNOSIS — N390 Urinary tract infection, site not specified: Secondary | ICD-10-CM | POA: Diagnosis not present

## 2013-06-17 DIAGNOSIS — G40909 Epilepsy, unspecified, not intractable, without status epilepticus: Secondary | ICD-10-CM | POA: Insufficient documentation

## 2013-06-17 DIAGNOSIS — Z3202 Encounter for pregnancy test, result negative: Secondary | ICD-10-CM | POA: Insufficient documentation

## 2013-06-17 DIAGNOSIS — Z79899 Other long term (current) drug therapy: Secondary | ICD-10-CM | POA: Diagnosis not present

## 2013-06-17 DIAGNOSIS — D259 Leiomyoma of uterus, unspecified: Secondary | ICD-10-CM | POA: Diagnosis not present

## 2013-06-17 DIAGNOSIS — R109 Unspecified abdominal pain: Secondary | ICD-10-CM

## 2013-06-17 DIAGNOSIS — R1031 Right lower quadrant pain: Secondary | ICD-10-CM | POA: Diagnosis present

## 2013-06-17 LAB — URINALYSIS, ROUTINE W REFLEX MICROSCOPIC
Bilirubin Urine: NEGATIVE
Glucose, UA: NEGATIVE mg/dL
Ketones, ur: NEGATIVE mg/dL
Nitrite: NEGATIVE
Protein, ur: NEGATIVE mg/dL
Specific Gravity, Urine: 1.007 (ref 1.005–1.030)
Urobilinogen, UA: 0.2 mg/dL (ref 0.0–1.0)
pH: 7.5 (ref 5.0–8.0)

## 2013-06-17 LAB — COMPREHENSIVE METABOLIC PANEL
ALT: 11 U/L (ref 0–35)
AST: 24 U/L (ref 0–37)
Albumin: 3.4 g/dL — ABNORMAL LOW (ref 3.5–5.2)
Alkaline Phosphatase: 70 U/L (ref 39–117)
BUN: 7 mg/dL (ref 6–23)
CO2: 26 mEq/L (ref 19–32)
Calcium: 8.9 mg/dL (ref 8.4–10.5)
Chloride: 100 mEq/L (ref 96–112)
Creatinine, Ser: 0.97 mg/dL (ref 0.50–1.10)
GFR calc Af Amer: 82 mL/min — ABNORMAL LOW (ref >90)
GFR calc non Af Amer: 71 mL/min — ABNORMAL LOW (ref >90)
Glucose, Bld: 90 mg/dL (ref 70–99)
Potassium: 4.6 mEq/L (ref 3.7–5.3)
Sodium: 137 mEq/L (ref 137–147)
Total Bilirubin: 0.4 mg/dL (ref 0.3–1.2)
Total Protein: 8.2 g/dL (ref 6.0–8.3)

## 2013-06-17 LAB — CBC WITH DIFFERENTIAL/PLATELET
Basophils Absolute: 0 10*3/uL (ref 0.0–0.1)
Basophils Relative: 0 % (ref 0–1)
Eosinophils Absolute: 0 10*3/uL (ref 0.0–0.7)
Eosinophils Relative: 0 % (ref 0–5)
HCT: 37.9 % (ref 36.0–46.0)
Hemoglobin: 12.9 g/dL (ref 12.0–15.0)
Lymphocytes Relative: 9 % — ABNORMAL LOW (ref 12–46)
Lymphs Abs: 0.9 10*3/uL (ref 0.7–4.0)
MCH: 25.9 pg — ABNORMAL LOW (ref 26.0–34.0)
MCHC: 34 g/dL (ref 30.0–36.0)
MCV: 76 fL — ABNORMAL LOW (ref 78.0–100.0)
Monocytes Absolute: 0.6 10*3/uL (ref 0.1–1.0)
Monocytes Relative: 6 % (ref 3–12)
Neutro Abs: 7.8 10*3/uL — ABNORMAL HIGH (ref 1.7–7.7)
Neutrophils Relative %: 84 % — ABNORMAL HIGH (ref 43–77)
Platelets: 165 10*3/uL (ref 150–400)
RBC: 4.99 MIL/uL (ref 3.87–5.11)
RDW: 18.6 % — ABNORMAL HIGH (ref 11.5–15.5)
WBC: 9.3 10*3/uL (ref 4.0–10.5)

## 2013-06-17 LAB — URINE MICROSCOPIC-ADD ON

## 2013-06-17 LAB — LIPASE, BLOOD: Lipase: 33 U/L (ref 11–59)

## 2013-06-17 LAB — WET PREP, GENITAL
Clue Cells Wet Prep HPF POC: NONE SEEN
Trich, Wet Prep: NONE SEEN
Yeast Wet Prep HPF POC: NONE SEEN

## 2013-06-17 LAB — VALPROIC ACID LEVEL: Valproic Acid Lvl: 103 ug/mL — ABNORMAL HIGH (ref 50.0–100.0)

## 2013-06-17 LAB — PREGNANCY, URINE: Preg Test, Ur: NEGATIVE

## 2013-06-17 MED ORDER — CEFTRIAXONE SODIUM 1 G IJ SOLR
1.0000 g | Freq: Once | INTRAMUSCULAR | Status: AC
  Administered 2013-06-17: 1 g via INTRAVENOUS
  Filled 2013-06-17: qty 10

## 2013-06-17 MED ORDER — SODIUM CHLORIDE 0.9 % IV SOLN
INTRAVENOUS | Status: DC
  Administered 2013-06-17: 18:00:00 via INTRAVENOUS

## 2013-06-17 MED ORDER — IOHEXOL 300 MG/ML  SOLN
50.0000 mL | Freq: Once | INTRAMUSCULAR | Status: AC | PRN
  Administered 2013-06-17: 50 mL via ORAL

## 2013-06-17 MED ORDER — CEPHALEXIN 500 MG PO CAPS: 500.0000 mg | ORAL_CAPSULE | Freq: Four times a day (QID) | ORAL | Status: DC

## 2013-06-17 MED ORDER — ONDANSETRON HCL 4 MG/2ML IJ SOLN
4.0000 mg | Freq: Once | INTRAMUSCULAR | Status: AC
Start: 2013-06-17 — End: 2013-06-17
  Administered 2013-06-17: 4 mg via INTRAVENOUS
  Filled 2013-06-17: qty 2

## 2013-06-17 MED ORDER — HYDROMORPHONE HCL PF 1 MG/ML IJ SOLN
0.5000 mg | Freq: Once | INTRAMUSCULAR | Status: AC
  Administered 2013-06-17: 0.5 mg via INTRAVENOUS
  Filled 2013-06-17: qty 1

## 2013-06-17 MED ORDER — IOHEXOL 300 MG/ML  SOLN
100.0000 mL | Freq: Once | INTRAMUSCULAR | Status: AC | PRN
  Administered 2013-06-17: 100 mL via INTRAVENOUS

## 2013-06-17 NOTE — ED Notes (Signed)
Pt back from US

## 2013-06-17 NOTE — ED Notes (Signed)
Patient transported to Ultrasound 

## 2013-06-17 NOTE — ED Notes (Signed)
Bed: AV40 Expected date:  Expected time:  Means of arrival:  Comments: EMS-RUQ pain

## 2013-06-17 NOTE — ED Notes (Addendum)
Per EMS pt had sudden onset of right sided abd pain. Pt denies n/v/d or urinal/bowel symptoms. Pt is on 3rd day of menstrual cycle. Pt reports never getting cramps like this.

## 2013-06-17 NOTE — ED Provider Notes (Signed)
Medical screening examination/treatment/procedure(s) were performed by non-physician practitioner and as supervising physician I was immediately available for consultation/collaboration.  EKG Interpretation   None          Wandra Arthurs, MD 06/17/13 2314

## 2013-06-17 NOTE — ED Provider Notes (Signed)
Care assumed from [place], PA-C at shift change. Pt with sudden onset RLQ pain, mild nausea. CT pending to r/o appendicitis. Pelvic ultrasound obtained initially, small uterine fibroids noted, possible endometrial polyp. No ovarian or adnexal mass noted. No leukocytosis, wet prep normal. GC/Chlamydia pending. Urinalysis with large amount of blood, patient is currently menstruating. 21-50 white cells in urine, patient was already given Rocephin. 7:16 PM Patient resting comfortably on exam bed, no apparent distress, drinking contrast. 8:50 PM CT without any acute finding. I discussed CT results with patient, she will f/u with GYN. Keflex at d/c for UTI. Stable for discharge. Return precautions given. Patient states understanding of treatment care plan and is agreeable.   Illene Labrador, PA-C 06/17/13 2051

## 2013-06-17 NOTE — Discharge Instructions (Signed)
Take antibiotic to completion for your urinary tract infection.  Abdominal Pain, Women Abdominal (stomach, pelvic, or belly) pain can be caused by many things. It is important to tell your doctor:  The location of the pain.  Does it come and go or is it present all the time?  Are there things that start the pain (eating certain foods, exercise)?  Are there other symptoms associated with the pain (fever, nausea, vomiting, diarrhea)? All of this is helpful to know when trying to find the cause of the pain. CAUSES   Stomach: virus or bacteria infection, or ulcer.  Intestine: appendicitis (inflamed appendix), regional ileitis (Crohn's disease), ulcerative colitis (inflamed colon), irritable bowel syndrome, diverticulitis (inflamed diverticulum of the colon), or cancer of the stomach or intestine.  Gallbladder disease or stones in the gallbladder.  Kidney disease, kidney stones, or infection.  Pancreas infection or cancer.  Fibromyalgia (pain disorder).  Diseases of the female organs:  Uterus: fibroid (non-cancerous) tumors or infection.  Fallopian tubes: infection or tubal pregnancy.  Ovary: cysts or tumors.  Pelvic adhesions (scar tissue).  Endometriosis (uterus lining tissue growing in the pelvis and on the pelvic organs).  Pelvic congestion syndrome (female organs filling up with blood just before the menstrual period).  Pain with the menstrual period.  Pain with ovulation (producing an egg).  Pain with an IUD (intrauterine device, birth control) in the uterus.  Cancer of the female organs.  Functional pain (pain not caused by a disease, may improve without treatment).  Psychological pain.  Depression. DIAGNOSIS  Your doctor will decide the seriousness of your pain by doing an examination.  Blood tests.  X-rays.  Ultrasound.  CT scan (computed tomography, special type of X-ray).  MRI (magnetic resonance imaging).  Cultures, for infection.  Barium  enema (dye inserted in the large intestine, to better view it with X-rays).  Colonoscopy (looking in intestine with a lighted tube).  Laparoscopy (minor surgery, looking in abdomen with a lighted tube).  Major abdominal exploratory surgery (looking in abdomen with a large incision). TREATMENT  The treatment will depend on the cause of the pain.   Many cases can be observed and treated at home.  Over-the-counter medicines recommended by your caregiver.  Prescription medicine.  Antibiotics, for infection.  Birth control pills, for painful periods or for ovulation pain.  Hormone treatment, for endometriosis.  Nerve blocking injections.  Physical therapy.  Antidepressants.  Counseling with a psychologist or psychiatrist.  Minor or major surgery. HOME CARE INSTRUCTIONS   Do not take laxatives, unless directed by your caregiver.  Take over-the-counter pain medicine only if ordered by your caregiver. Do not take aspirin because it can cause an upset stomach or bleeding.  Try a clear liquid diet (broth or water) as ordered by your caregiver. Slowly move to a bland diet, as tolerated, if the pain is related to the stomach or intestine.  Have a thermometer and take your temperature several times a day, and record it.  Bed rest and sleep, if it helps the pain.  Avoid sexual intercourse, if it causes pain.  Avoid stressful situations.  Keep your follow-up appointments and tests, as your caregiver orders.  If the pain does not go away with medicine or surgery, you may try:  Acupuncture.  Relaxation exercises (yoga, meditation).  Group therapy.  Counseling. SEEK MEDICAL CARE IF:   You notice certain foods cause stomach pain.  Your home care treatment is not helping your pain.  You need stronger pain medicine.  You want your IUD removed.  You feel faint or lightheaded.  You develop nausea and vomiting.  You develop a rash.  You are having side effects or an  allergy to your medicine. SEEK IMMEDIATE MEDICAL CARE IF:   Your pain does not go away or gets worse.  You have a fever.  Your pain is felt only in portions of the abdomen. The right side could possibly be appendicitis. The left lower portion of the abdomen could be colitis or diverticulitis.  You are passing blood in your stools (bright red or black tarry stools, with or without vomiting).  You have blood in your urine.  You develop chills, with or without a fever.  You pass out. MAKE SURE YOU:   Understand these instructions.  Will watch your condition.  Will get help right away if you are not doing well or get worse. Document Released: 03/09/2007 Document Revised: 08/04/2011 Document Reviewed: 03/29/2009 [facility] Patient Information 2014 [place], [place].  Urinary Tract Infection Urinary tract infections (UTIs) can develop anywhere along your urinary tract. Your urinary tract is your body's drainage system for removing wastes and extra water. Your urinary tract includes two kidneys, two ureters, a bladder, and a urethra. Your kidneys are a pair of bean-shaped organs. Each kidney is about the size of your fist. They are located below your ribs, one on each side of your spine. CAUSES Infections are caused by microbes, which are microscopic organisms, including fungi, viruses, and bacteria. These organisms are so small that they can only be seen through a microscope. Bacteria are the microbes that most commonly cause UTIs. SYMPTOMS  Symptoms of UTIs may vary by age and gender of the patient and by the location of the infection. Symptoms in young women typically include a frequent and intense urge to urinate and a painful, burning feeling in the bladder or urethra during urination. Older women and men are more likely to be tired, shaky, and weak and have muscle aches and abdominal pain. A fever may mean the infection is in your kidneys. Other symptoms of a kidney infection include pain  in your back or sides below the ribs, nausea, and vomiting. DIAGNOSIS To diagnose a UTI, your caregiver will ask you about your symptoms. Your caregiver also will ask to provide a urine sample. The urine sample will be tested for bacteria and white blood cells. White blood cells are made by your body to help fight infection. TREATMENT  Typically, UTIs can be treated with medication. Because most UTIs are caused by a bacterial infection, they usually can be treated with the use of antibiotics. The choice of antibiotic and length of treatment depend on your symptoms and the type of bacteria causing your infection. HOME CARE INSTRUCTIONS  If you were prescribed antibiotics, take them exactly as your caregiver instructs you. Finish the medication even if you feel better after you have only taken some of the medication.  Drink enough water and fluids to keep your urine clear or pale yellow.  Avoid caffeine, tea, and carbonated beverages. They tend to irritate your bladder.  Empty your bladder often. Avoid holding urine for long periods of time.  Empty your bladder before and after sexual intercourse.  After a bowel movement, women should cleanse from front to back. Use each tissue only once. SEEK MEDICAL CARE IF:   You have back pain.  You develop a fever.  Your symptoms do not begin to resolve within 3 days. SEEK IMMEDIATE MEDICAL CARE IF:  You have severe back pain or lower abdominal pain.  You develop chills.  You have nausea or vomiting.  You have continued burning or discomfort with urination. MAKE SURE YOU:   Understand these instructions.  Will watch your condition.  Will get help right away if you are not doing well or get worse. Document Released: 02/19/2005 Document Revised: 11/11/2011 Document Reviewed: 06/20/2011 [facility] Patient Information 2014 [place].  Uterine Fibroid A uterine fibroid is a growth (tumor) that occurs in your uterus. This type of tumor  is not cancerous and does not spread out of the uterus. You can have one or many fibroids. Fibroids can vary in size, weight, and where they grow in the uterus. Some can become quite large. Most fibroids do not require medical treatment, but some can cause pain or heavy bleeding during and between periods. CAUSES  A fibroid is the result of a single uterine cell that keeps growing (unregulated), which is different than most cells in the human body. Most cells have a control mechanism that keeps them from reproducing without control.  SIGNS AND SYMPTOMS   Bleeding.  Pelvic pain and pressure.  Bladder problems due to the size of the fibroid.  Infertility and miscarriages depending on the size and location of the fibroid. DIAGNOSIS  Uterine fibroids are diagnosed through a physical exam. Your health care provider may feel the lumpy tumors during a pelvic exam. Ultrasonography may be done to get information regarding size, location, and number of tumors.  TREATMENT   Your health care provider may recommend watchful waiting. This involves getting the fibroid checked by your health care provider to see if it grows or shrinks.   Hormone treatment or an intrauterine device (IUD) may be prescribed.   Surgery may be needed to remove the fibroids (myomectomy) or the uterus (hysterectomy). This depends on your situation. When fibroids interfere with fertility and a woman wants to become pregnant, a health care provider may recommend having the fibroids removed.  [place] care depends on how you were treated. In general:   Keep all follow-up appointments with your health care provider.   Only take over-the-counter or prescription medicines as directed by your health care provider. If you were prescribed a hormone treatment, take the hormone medicines exactly as directed. Do not take aspirin. It can cause bleeding.   Talk to your health care provider about taking iron  pills.  If your periods are troublesome but not so heavy, lie down with your feet raised slightly above your heart. Place cold packs on your lower abdomen.   If your periods are heavy, write down the number of pads or tampons you use per month. Bring this information to your health care provider.   Include green vegetables in your diet.  SEEK IMMEDIATE MEDICAL CARE IF:  You have pelvic pain or cramps not controlled with medicines.   You have a sudden increase in pelvic pain.   You have an increase in bleeding between and during periods.   You have excessive periods and soak tampons or pads in a half hour or less.  You feel lightheaded or have fainting episodes. Document Released: 05/09/2000 Document Revised: 03/02/2013 Document Reviewed: 12/09/2012 [facility] Patient Information 2014 [place], [place].

## 2013-06-17 NOTE — ED Notes (Signed)
Pt gone for US

## 2013-06-17 NOTE — ED Provider Notes (Signed)
CSN: [id number]     Arrival date & time 06/17/13  1314 History   First MD Initiated Contact with Patient 06/17/13 1318     Chief Complaint  Patient presents with  . Abdominal Pain   (Consider location/radiation/quality/duration/timing/severity/associated sxs/prior Treatment) The history is provided by the patient. No language interpreter was used.  Melissa Dougherty is a 43 year old female with past medical history of seizure, alcohol abuse presenting to emergency department with right lower quadrant pain that started abruptly approximately 20 minutes prior to arrival to the emergency department. Patient reports that the right lower quadrant pain came on suddenly described as a sharp shooting pain without radiation. Stated that the pain is intermittent, coming and going. Stated that the pain worsens with deep inhalation. Stated that she's been using ibuprofen with minimal relief. Stated that she is currently on her third day of her menstrual cycle - stated that she has never experienced anything this painful before. Reported pain with walking and standing straight up. No nausea, vomiting, diarrhea, headache, dizziness, chest pain, shortness of breath, difficulty breathing, back pain, fever, chills. PCP Dr. Jeanie Cooks  Past Medical History  Diagnosis Date  . Seizures   . Alcohol abuse    Past Surgical History  Procedure Laterality Date  . Back surgery     No family history on file. History  Substance Use Topics  . Smoking status: Never Smoker   . Smokeless tobacco: Not on file  . Alcohol Use: Yes     Comment: presently in [place] for recovery   OB History   Grav Para Term Preterm Abortions TAB SAB Ect Mult Living                 Review of Systems  Constitutional: Negative for fever and chills.  Respiratory: Negative for chest tightness and shortness of breath.   Cardiovascular: Negative for chest pain.  Genitourinary: Positive for vaginal bleeding (patient currently on her third day  of menstruation).  Neurological: Negative for dizziness and weakness.  All other systems reviewed and are negative.    Allergies  Review of patient's allergies indicates no known allergies.  Home Medications   Current Outpatient Rx  Name  Route  Sig  Dispense  Refill  . divalproex (DEPAKOTE ER) 250 MG 24 hr tablet   Oral   Take 750 mg by mouth at bedtime.         Marland Kitchen ibuprofen (ADVIL,MOTRIN) 800 MG tablet   Oral   Take 1,600 mg by mouth every 6 (six) hours as needed for mild pain or cramping.         . polyethylene glycol powder (MIRALAX) powder   Oral   Take 17 g by mouth daily.   255 g   0   . risperiDONE (RISPERDAL) 1 MG tablet   Oral   Take 1 mg by mouth at bedtime.          BP 122/79  Pulse 95  Temp(Src) 97.5 F (36.4 C) (Oral)  Resp 14  SpO2 98%  LMP 06/14/2013 Physical Exam  Nursing note and vitals reviewed. Constitutional: She is oriented to person, place, and time. She appears well-developed and well-nourished. No distress.  HENT:  Head: Normocephalic and atraumatic.  Mouth/Throat: Oropharynx is clear and moist. No oropharyngeal exudate.  Eyes: Conjunctivae and EOM are normal. Pupils are equal, round, and reactive to light. Right eye exhibits no discharge. Left eye exhibits no discharge.  Neck: Normal range of motion. Neck supple.  Cardiovascular: Normal rate,  regular rhythm and normal heart sounds.   Pulses:      Radial pulses are 2+ on the right side, and 2+ on the left side.       Dorsalis pedis pulses are 2+ on the right side, and 2+ on the left side.  Pulmonary/Chest: Effort normal and breath sounds normal. No respiratory distress. She has no wheezes. She has no rales.  Abdominal: Soft. Bowel sounds are normal. There is tenderness in the right lower quadrant. There is tenderness at McBurney's point. There is no rigidity and negative Murphy's sign.    Soft upon palpation Tenderness upon palpation to right lower quadrant Positive McBurney's  point Negative psoas, positive obturator  Genitourinary:  Pelvic: Negative swelling, erythema, inflammation, lesions, sores, abnormalities noted external genitalia. Negative swelling, erythema, inflammation, lesions, sores noted to the vaginal canal. Bright red blood in the vaginal vault - patient currently on third day of menstrual cycle. Cervical os noted with negative inflammation or friability. Negative CMT tenderness. Mild right adnexal tenderness noted. Negative left adnexal tenderness. Exam chaperoned with tech  Musculoskeletal: Normal range of motion.  Full ROM to upper and lower extremities without difficulty noted, negative ataxia noted.  Neurological: She is alert and oriented to person, place, and time. She exhibits normal muscle tone. Coordination normal.  Skin: Skin is warm and dry. No rash noted. She is not diaphoretic. No erythema.  Psychiatric: She has a normal mood and affect. Her behavior is normal. Thought content normal.    ED Course  Procedures (including critical care time)  Results for orders placed during the hospital encounter of 06/17/13  WET PREP, GENITAL      Result Value Range   Yeast Wet Prep HPF POC NONE SEEN  NONE SEEN   Trich, Wet Prep NONE SEEN  NONE SEEN   Clue Cells Wet Prep HPF POC NONE SEEN  NONE SEEN   WBC, Wet Prep HPF POC RARE (*) NONE SEEN  URINALYSIS, ROUTINE W REFLEX MICROSCOPIC      Result Value Range   Color, Urine YELLOW  YELLOW   APPearance CLEAR  CLEAR   Specific Gravity, Urine 1.007  1.005 - 1.030   pH 7.5  5.0 - 8.0   Glucose, UA NEGATIVE  NEGATIVE mg/dL   Hgb urine dipstick LARGE (*) NEGATIVE   Bilirubin Urine NEGATIVE  NEGATIVE   Ketones, ur NEGATIVE  NEGATIVE mg/dL   Protein, ur NEGATIVE  NEGATIVE mg/dL   Urobilinogen, UA 0.2  0.0 - 1.0 mg/dL   Nitrite NEGATIVE  NEGATIVE   Leukocytes, UA LARGE (*) NEGATIVE  PREGNANCY, URINE      Result Value Range   Preg Test, Ur NEGATIVE  NEGATIVE  CBC WITH DIFFERENTIAL      Result Value  Range   WBC 9.3  4.0 - 10.5 K/uL   RBC 4.99  3.87 - 5.11 MIL/uL   Hemoglobin 12.9  12.0 - 15.0 g/dL   HCT 37.9  36.0 - 46.0 %   MCV 76.0 (*) 78.0 - 100.0 fL   MCH 25.9 (*) 26.0 - 34.0 pg   MCHC 34.0  30.0 - 36.0 g/dL   RDW 18.6 (*) 11.5 - 15.5 %   Platelets 165  150 - 400 K/uL   Neutrophils Relative % 84 (*) 43 - 77 %   Neutro Abs 7.8 (*) 1.7 - 7.7 K/uL   Lymphocytes Relative 9 (*) 12 - 46 %   Lymphs Abs 0.9  0.7 - 4.0 K/uL   Monocytes Relative 6  3 - 12 %   Monocytes Absolute 0.6  0.1 - 1.0 K/uL   Eosinophils Relative 0  0 - 5 %   Eosinophils Absolute 0.0  0.0 - 0.7 K/uL   Basophils Relative 0  0 - 1 %   Basophils Absolute 0.0  0.0 - 0.1 K/uL  COMPREHENSIVE METABOLIC PANEL      Result Value Range   Sodium 137  137 - 147 mEq/L   Potassium 4.6  3.7 - 5.3 mEq/L   Chloride 100  96 - 112 mEq/L   CO2 26  19 - 32 mEq/L   Glucose, Bld 90  70 - 99 mg/dL   BUN 7  6 - 23 mg/dL   Creatinine, Ser 0.97  0.50 - 1.10 mg/dL   Calcium 8.9  8.4 - 10.5 mg/dL   Total Protein 8.2  6.0 - 8.3 g/dL   Albumin 3.4 (*) 3.5 - 5.2 g/dL   AST 24  0 - 37 U/L   ALT 11  0 - 35 U/L   Alkaline Phosphatase 70  39 - 117 U/L   Total Bilirubin 0.4  0.3 - 1.2 mg/dL   GFR calc non Af Amer 71 (*) >90 mL/min   GFR calc Af Amer 82 (*) >90 mL/min  LIPASE, BLOOD      Result Value Range   Lipase 33  11 - 59 U/L  VALPROIC ACID LEVEL      Result Value Range   Valproic Acid Lvl 103.0 (*) 50.0 - 100.0 ug/mL  URINE MICROSCOPIC-ADD ON      Result Value Range   Squamous Epithelial / LPF RARE  RARE   WBC, UA 21-50  <3 WBC/hpf   RBC / HPF 11-20  <3 RBC/hpf   Bacteria, UA RARE  RARE   Urine-Other LESS THAN 10 mL OF URINE SUBMITTED      Labs Review Labs Reviewed  URINALYSIS, ROUTINE W REFLEX MICROSCOPIC - Abnormal; Notable for the following:    Hgb urine dipstick LARGE (*)    Leukocytes, UA LARGE (*)    All other components within normal limits  CBC WITH DIFFERENTIAL - Abnormal; Notable for the following:    MCV  76.0 (*)    MCH 25.9 (*)    RDW 18.6 (*)    Neutrophils Relative % 84 (*)    Neutro Abs 7.8 (*)    Lymphocytes Relative 9 (*)    All other components within normal limits  COMPREHENSIVE METABOLIC PANEL - Abnormal; Notable for the following:    Albumin 3.4 (*)    GFR calc non Af Amer 71 (*)    GFR calc Af Amer 82 (*)    All other components within normal limits  VALPROIC ACID LEVEL - Abnormal; Notable for the following:    Valproic Acid Lvl 103.0 (*)    All other components within normal limits  WET PREP, GENITAL  GC/CHLAMYDIA PROBE AMP  PREGNANCY, URINE  LIPASE, BLOOD  URINE MICROSCOPIC-ADD ON   Imaging Review No results found.  EKG Interpretation   None       MDM  No diagnosis found.  Medications  cefTRIAXone (ROCEPHIN) 1 g in dextrose 5 % 50 mL IVPB (1 g Intravenous New Bag/Given 06/17/13 1735)  0.9 %  sodium chloride infusion ( Intravenous New Bag/Given 06/17/13 1734)  HYDROmorphone (DILAUDID) injection 0.5 mg (0.5 mg Intravenous Given 06/17/13 1614)  ondansetron (ZOFRAN) injection 4 mg (4 mg Intravenous Given 06/17/13 1614)   Filed Vitals:   06/17/13 1316  06/17/13 1737  BP: 122/79 103/61  Pulse: 95 102  Temp: 97.5 F (36.4 C) 99.1 F (37.3 C)  TempSrc: Oral Oral  Resp: 14 14  SpO2: 98% 95%    Patient presenting to emergency department with sudden onset of right lower quadrant pain described as a sharp shooting pain, intermittent without radiation. Stated that it occurred 20 minutes prior to arrival to emergency department. Stated that this morning she was fine. Ibuprofen minimal relief. Patient currently on third day of menstrual cycles - reported that she never had cramps like this before. Denied nausea, vomiting, diarrhea. Alert and oriented. GCS 15. Heart rate and rhythm normal. Lungs clear to auscultation to upper lower lobes. Radial and DP pulses 2+ bilaterally. Normal appearance the abdomen. Bowel sounds normal active in all 4 quadrants. Discomfort upon  palpation to the right lower quadrant-positive McBurney's point, negative psoas, positive obturator. Unremarkable pelvic exam-mild discomfort upon palpation to right adnexal region, bright red bood in vaginal vault-patient currently menstruating. Wet prep negative for acute infection-negative findings for trichomoniasis, clue cells. Urinalysis noted large hemoglobin-patient currently menstruating-large leukocytosis with white blood cell count 21-50. Urine culture in process. CBC negative elevation white blood cell count-negative leukocytosis identified. CMP negative findings. Lipase negative elevation. Valproic acid mildly elevated 103.0 - normal upper limit of valproic acid is 100. Pelvic ultrasound and Doppler negative for evidence of bearing portion. No ovary or in her adnexal mass is identified. Small uterine fibroids noted. Focal area of endometrial thickening in the lower uterine segment-suspicion for polyp-recommendation for further evaluation with sonohysterogram for confirmation recommended with possible endometrial sampling if patient is at high-risk for endometrial carcinoma. GC Chlamydia probe pending. Patient given IV fluids and IV Rocephin for urinary tract infection. IV medication administered for pain control-pain moderately controlled. Patient continues to have right lower quadrant pain. CT abdomen and pelvis ordered to rule out possible acute inflammatory disease - results pending. Discussed case with Michele Mcalpine, PA-C. Transfer of care to Michele Mcalpine, PA-C at change in shift.   Jamse Mead, PA-C 06/18/13 1750

## 2013-06-19 LAB — GC/CHLAMYDIA PROBE AMP
CT Probe RNA: NEGATIVE
GC Probe RNA: NEGATIVE

## 2013-06-19 NOTE — ED Provider Notes (Signed)
Medical screening examination/treatment/procedure(s) were performed by non-physician practitioner and as supervising physician I was immediately available for consultation/collaboration.   Shana Younge T Jashun Puertas, MD 06/19/13 1113 

## 2013-06-25 ENCOUNTER — Encounter (HOSPITAL_COMMUNITY): Payer: Self-pay | Admitting: Emergency Medicine

## 2013-06-25 ENCOUNTER — Emergency Department (INDEPENDENT_AMBULATORY_CARE_PROVIDER_SITE_OTHER)
Admission: EM | Admit: 2013-06-25 | Discharge: 2013-06-25 | Disposition: A | Discharge status: Home / Self Care | Payer: Medicare HMO | Source: Home / Self Care | Attending: Family Medicine | Admitting: Family Medicine

## 2013-06-25 DIAGNOSIS — R109 Unspecified abdominal pain: Secondary | ICD-10-CM

## 2013-06-25 LAB — POCT URINALYSIS DIP (DEVICE)
Bilirubin Urine: NEGATIVE
Glucose, UA: NEGATIVE mg/dL
Hgb urine dipstick: NEGATIVE
Ketones, ur: NEGATIVE mg/dL
Nitrite: NEGATIVE
Protein, ur: NEGATIVE mg/dL
Specific Gravity, Urine: 1.015 (ref 1.005–1.030)
Urobilinogen, UA: 0.2 mg/dL (ref 0.0–1.0)
pH: 6.5 (ref 5.0–8.0)

## 2013-06-25 LAB — POCT PREGNANCY, URINE: Preg Test, Ur: NEGATIVE

## 2013-06-25 MED ORDER — POLYETHYLENE GLYCOL 3350 17 GM/SCOOP PO POWD: 17.0000 g | Freq: Every day | ORAL | Status: DC

## 2013-06-25 NOTE — Discharge Instructions (Signed)
Thank you for coming in today. Take mirlax daily.  Use enough to have one soft bowel movement daily.  If your belly pain worsens, or you have high fever, bad vomiting, blood in your stool or black tarry stool go to the Emergency Room.  Follow up with your doctor.  Ask for a referral.   Abdominal Pain, Women Abdominal (stomach, pelvic, or belly) pain can be caused by many things. It is important to tell your doctor:  The location of the pain.  Does it come and go or is it present all the time?  Are there things that start the pain (eating certain foods, exercise)?  Are there other symptoms associated with the pain (fever, nausea, vomiting, diarrhea)? All of this is helpful to know when trying to find the cause of the pain. CAUSES   Stomach: virus or bacteria infection, or ulcer.  Intestine: appendicitis (inflamed appendix), regional ileitis (Crohn's disease), ulcerative colitis (inflamed colon), irritable bowel syndrome, diverticulitis (inflamed diverticulum of the colon), or cancer of the stomach or intestine.  Gallbladder disease or stones in the gallbladder.  Kidney disease, kidney stones, or infection.  Pancreas infection or cancer.  Fibromyalgia (pain disorder).  Diseases of the female organs:  Uterus: fibroid (non-cancerous) tumors or infection.  Fallopian tubes: infection or tubal pregnancy.  Ovary: cysts or tumors.  Pelvic adhesions (scar tissue).  Endometriosis (uterus lining tissue growing in the pelvis and on the pelvic organs).  Pelvic congestion syndrome (female organs filling up with blood just before the menstrual period).  Pain with the menstrual period.  Pain with ovulation (producing an egg).  Pain with an IUD (intrauterine device, birth control) in the uterus.  Cancer of the female organs.  Functional pain (pain not caused by a disease, may improve without treatment).  Psychological pain.  Depression. DIAGNOSIS  Your doctor will decide the  seriousness of your pain by doing an examination.  Blood tests.  X-rays.  Ultrasound.  CT scan (computed tomography, special type of X-ray).  MRI (magnetic resonance imaging).  Cultures, for infection.  Barium enema (dye inserted in the large intestine, to better view it with X-rays).  Colonoscopy (looking in intestine with a lighted tube).  Laparoscopy (minor surgery, looking in abdomen with a lighted tube).  Major abdominal exploratory surgery (looking in abdomen with a large incision). TREATMENT  The treatment will depend on the cause of the pain.   Many cases can be observed and treated at home.  Over-the-counter medicines recommended by your caregiver.  Prescription medicine.  Antibiotics, for infection.  Birth control pills, for painful periods or for ovulation pain.  Hormone treatment, for endometriosis.  Nerve blocking injections.  Physical therapy.  Antidepressants.  Counseling with a psychologist or psychiatrist.  Minor or major surgery. HOME CARE INSTRUCTIONS   Do not take laxatives, unless directed by your caregiver.  Take over-the-counter pain medicine only if ordered by your caregiver. Do not take aspirin because it can cause an upset stomach or bleeding.  Try a clear liquid diet (broth or water) as ordered by your caregiver. Slowly move to a bland diet, as tolerated, if the pain is related to the stomach or intestine.  Have a thermometer and take your temperature several times a day, and record it.  Bed rest and sleep, if it helps the pain.  Avoid sexual intercourse, if it causes pain.  Avoid stressful situations.  Keep your follow-up appointments and tests, as your caregiver orders.  If the pain does not go away with  medicine or surgery, you may try:  Acupuncture.  Relaxation exercises (yoga, meditation).  Group therapy.  Counseling. SEEK MEDICAL CARE IF:   You notice certain foods cause stomach pain.  Your home care  treatment is not helping your pain.  You need stronger pain medicine.  You want your IUD removed.  You feel faint or lightheaded.  You develop nausea and vomiting.  You develop a rash.  You are having side effects or an allergy to your medicine. SEEK IMMEDIATE MEDICAL CARE IF:   Your pain does not go away or gets worse.  You have a fever.  Your pain is felt only in portions of the abdomen. The right side could possibly be appendicitis. The left lower portion of the abdomen could be colitis or diverticulitis.  You are passing blood in your stools (bright red or black tarry stools, with or without vomiting).  You have blood in your urine.  You develop chills, with or without a fever.  You pass out. MAKE SURE YOU:   Understand these instructions.  Will watch your condition.  Will get help right away if you are not doing well or get worse. Document Released: 03/09/2007 Document Revised: 08/04/2011 Document Reviewed: 03/29/2009 [facility] Patient Information 2014 [place], [place].

## 2013-06-25 NOTE — ED Notes (Signed)
Pt c/o lower abd pain onset 2 weeks Reports she went to [facility] ER; given Cephalexin Completed antibiotic course Denies: urinary sxs, f/v/n/d Alert w/no signs of acute distress.

## 2013-06-25 NOTE — ED Provider Notes (Signed)
Melissa Dougherty is a 43 y.o. female who presents to Urgent Care today for abdominal pain and swelling. Patient was seen in the emergency room on January 23rd. She had a extensive workup including abdominal and pelvic ultrasound, CT scan of abdomen and pelvis, and lab work. She was thought to have a urinary tract infection and treated with Keflex. She continued to have moderate abdominal pain and distention. She followup with her primary care provider 2 days ago who thought perhaps she had constipation and advised to take MiraLAX. She did not yet take MiraLAX. She notes she has bowel movements every other day. She denies any diarrhea nausea or vomiting. She denies any dysuria fevers or chills. The pain is moderate and diffuse.   Past Medical History  Diagnosis Date  . Seizures   . Alcohol abuse    History  Substance Use Topics  . Smoking status: Never Smoker   . Smokeless tobacco: Not on file  . Alcohol Use: Yes     Comment: presently in [place] for recovery   ROS as above Medications: No current facility-administered medications for this encounter.   Current Outpatient Prescriptions  Medication Sig Dispense Refill  . cephALEXin (KEFLEX) 500 MG capsule Take 1 capsule (500 mg total) by mouth 4 (four) times daily.  28 capsule  0  . divalproex (DEPAKOTE ER) 250 MG 24 hr tablet Take 750 mg by mouth at bedtime.      Marland Kitchen ibuprofen (ADVIL,MOTRIN) 800 MG tablet Take 1,600 mg by mouth every 6 (six) hours as needed for mild pain or cramping.      . polyethylene glycol powder (MIRALAX) powder Take 17 g by mouth daily.  850 g  1  . risperiDONE (RISPERDAL) 1 MG tablet Take 1 mg by mouth at bedtime.        Exam:  BP 120/79  Pulse 81  Temp(Src) 98.3 F (36.8 C) (Oral)  Resp 17  SpO2 99%  LMP 06/14/2013 Gen: Well NAD HEENT: EOMI,  MMM Lungs: Normal work of breathing. CTABL Heart: RRR no MRG Abd: NABS, Soft. NT, ND no rebound or guarding. Extensive scarring present across abdomen Exts: Brisk  capillary refill, warm and well perfused.   Results for orders placed during the hospital encounter of 06/25/13 (from the past 24 hour(s))  POCT URINALYSIS DIP (DEVICE)     Status: Abnormal   Collection Time    06/25/13  5:27 PM      Result Value Range   Glucose, UA NEGATIVE  NEGATIVE mg/dL   Bilirubin Urine NEGATIVE  NEGATIVE   Ketones, ur NEGATIVE  NEGATIVE mg/dL   Specific Gravity, Urine 1.015  1.005 - 1.030   Hgb urine dipstick NEGATIVE  NEGATIVE   pH 6.5  5.0 - 8.0   Protein, ur NEGATIVE  NEGATIVE mg/dL   Urobilinogen, UA 0.2  0.0 - 1.0 mg/dL   Nitrite NEGATIVE  NEGATIVE   Leukocytes, UA SMALL (*) NEGATIVE  POCT PREGNANCY, URINE     Status: None   Collection Time    06/25/13  5:27 PM      Result Value Range   Preg Test, Ur NEGATIVE  NEGATIVE   No results found.  Assessment and Plan: 43 y.o. female with unclear abdominal pain. This was recently treated for the possibility of a urinary tract infection. I am obtaining a culture of her urine now. I think perhaps she does have constipation. Plan to treat with MiraLAX. Recommend followup with primary care provider. Patient may benefit from referral  with gynecology or gastroenterology.   Discussed warning signs or symptoms. Please see discharge instructions. Patient expresses understanding.    Gregor Hams, MD 06/25/13 [id number]

## 2013-06-27 LAB — URINE CULTURE: Colony Count: 50000

## 2013-07-27 ENCOUNTER — Other Ambulatory Visit: Payer: Self-pay

## 2013-07-27 ENCOUNTER — Encounter: Payer: Medicare HMO | Admitting: Obstetrics & Gynecology

## 2013-07-27 DIAGNOSIS — Z1231 Encounter for screening mammogram for malignant neoplasm of breast: Secondary | ICD-10-CM

## 2013-08-25 ENCOUNTER — Other Ambulatory Visit (HOSPITAL_COMMUNITY)
Admission: RE | Admit: 2013-08-25 | Discharge: 2013-08-25 | Disposition: A | Discharge status: Home / Self Care | Payer: Medicare HMO | Source: Ambulatory Visit | Attending: Family Medicine | Admitting: Family Medicine

## 2013-08-25 ENCOUNTER — Other Ambulatory Visit: Payer: Self-pay | Admitting: Family Medicine

## 2013-08-25 ENCOUNTER — Other Ambulatory Visit: Payer: Medicare HMO | Admitting: Family Medicine

## 2013-08-25 DIAGNOSIS — N760 Acute vaginitis: Secondary | ICD-10-CM | POA: Diagnosis present

## 2013-08-25 DIAGNOSIS — Z1151 Encounter for screening for human papillomavirus (HPV): Secondary | ICD-10-CM | POA: Diagnosis present

## 2013-08-25 DIAGNOSIS — Z113 Encounter for screening for infections with a predominantly sexual mode of transmission: Secondary | ICD-10-CM | POA: Diagnosis present

## 2013-08-25 DIAGNOSIS — R87619 Unspecified abnormal cytological findings in specimens from cervix uteri: Secondary | ICD-10-CM | POA: Insufficient documentation

## 2013-08-25 DIAGNOSIS — Z124 Encounter for screening for malignant neoplasm of cervix: Secondary | ICD-10-CM | POA: Diagnosis present

## 2013-08-29 LAB — URINE CYTOLOGY ANCILLARY ONLY
Bacterial vaginitis: POSITIVE — AB
Candida vaginitis: NEGATIVE
Chlamydia: NEGATIVE
Neisseria Gonorrhea: NEGATIVE
Trichomonas: NEGATIVE

## 2013-09-06 ENCOUNTER — Ambulatory Visit: Payer: Medicare HMO

## 2013-09-13 ENCOUNTER — Emergency Department (INDEPENDENT_AMBULATORY_CARE_PROVIDER_SITE_OTHER)
Admission: EM | Admit: 2013-09-13 | Discharge: 2013-09-13 | Disposition: A | Discharge status: Home / Self Care | Payer: Commercial Managed Care - HMO | Source: Home / Self Care | Attending: Emergency Medicine | Admitting: Emergency Medicine

## 2013-09-13 ENCOUNTER — Encounter (HOSPITAL_COMMUNITY): Payer: Self-pay | Admitting: Emergency Medicine

## 2013-09-13 DIAGNOSIS — R51 Headache: Secondary | ICD-10-CM

## 2013-09-13 DIAGNOSIS — B349 Viral infection, unspecified: Secondary | ICD-10-CM

## 2013-09-13 DIAGNOSIS — R110 Nausea: Secondary | ICD-10-CM

## 2013-09-13 DIAGNOSIS — R109 Unspecified abdominal pain: Secondary | ICD-10-CM

## 2013-09-13 DIAGNOSIS — R519 Headache, unspecified: Secondary | ICD-10-CM

## 2013-09-13 DIAGNOSIS — B9789 Other viral agents as the cause of diseases classified elsewhere: Secondary | ICD-10-CM

## 2013-09-13 LAB — POCT URINALYSIS DIP (DEVICE)
Bilirubin Urine: NEGATIVE
Glucose, UA: NEGATIVE mg/dL
Hgb urine dipstick: NEGATIVE
Ketones, ur: NEGATIVE mg/dL
Leukocytes, UA: NEGATIVE
Nitrite: NEGATIVE
Protein, ur: NEGATIVE mg/dL
Specific Gravity, Urine: 1.01 (ref 1.005–1.030)
Urobilinogen, UA: 0.2 mg/dL (ref 0.0–1.0)
pH: 7 (ref 5.0–8.0)

## 2013-09-13 LAB — GLUCOSE, CAPILLARY: Glucose-Capillary: 72 mg/dL (ref 70–99)

## 2013-09-13 LAB — POCT PREGNANCY, URINE: Preg Test, Ur: NEGATIVE

## 2013-09-13 MED ORDER — ONDANSETRON 8 MG PO TBDP: 8.0000 mg | ORAL_TABLET | Freq: Three times a day (TID) | ORAL | Status: DC | PRN

## 2013-09-13 NOTE — Discharge Instructions (Signed)
Abdominal Pain, Women °Abdominal (stomach, pelvic, or belly) pain can be caused by many things. It is important to tell your doctor: °· The location of the pain. °· Does it come and go or is it present all the time? °· Are there things that start the pain (eating certain foods, exercise)? °· Are there other symptoms associated with the pain (fever, nausea, vomiting, diarrhea)? °All of this is helpful to know when trying to find the cause of the pain. °CAUSES  °· Stomach: virus or bacteria infection, or ulcer. °· Intestine: appendicitis (inflamed appendix), regional ileitis (Crohn's disease), ulcerative colitis (inflamed colon), irritable bowel syndrome, diverticulitis (inflamed diverticulum of the colon), or cancer of the stomach or intestine. °· Gallbladder disease or stones in the gallbladder. °· Kidney disease, kidney stones, or infection. °· Pancreas infection or cancer. °· Fibromyalgia (pain disorder). °· Diseases of the female organs: °· Uterus: fibroid (non-cancerous) tumors or infection. °· Fallopian tubes: infection or tubal pregnancy. °· Ovary: cysts or tumors. °· Pelvic adhesions (scar tissue). °· Endometriosis (uterus lining tissue growing in the pelvis and on the pelvic organs). °· Pelvic congestion syndrome (female organs filling up with blood just before the menstrual period). °· Pain with the menstrual period. °· Pain with ovulation (producing an egg). °· Pain with an IUD (intrauterine device, birth control) in the uterus. °· Cancer of the female organs. °· Functional pain (pain not caused by a disease, may improve without treatment). °· Psychological pain. °· Depression. °DIAGNOSIS  °Your doctor will decide the seriousness of your pain by doing an examination. °· Blood tests. °· X-rays. °· Ultrasound. °· CT scan (computed tomography, special type of X-ray). °· MRI (magnetic resonance imaging). °· Cultures, for infection. °· Barium enema (dye inserted in the large intestine, to better view it with  X-rays). °· Colonoscopy (looking in intestine with a lighted tube). °· Laparoscopy (minor surgery, looking in abdomen with a lighted tube). °· Major abdominal exploratory surgery (looking in abdomen with a large incision). °TREATMENT  °The treatment will depend on the cause of the pain.  °· Many cases can be observed and treated at home. °· Over-the-counter medicines recommended by your caregiver. °· Prescription medicine. °· Antibiotics, for infection. °· Birth control pills, for painful periods or for ovulation pain. °· Hormone treatment, for endometriosis. °· Nerve blocking injections. °· Physical therapy. °· Antidepressants. °· Counseling with a psychologist or psychiatrist. °· Minor or major surgery. °HOME CARE INSTRUCTIONS  °· Do not take laxatives, unless directed by your caregiver. °· Take over-the-counter pain medicine only if ordered by your caregiver. Do not take aspirin because it can cause an upset stomach or bleeding. °· Try a clear liquid diet (broth or water) as ordered by your caregiver. Slowly move to a bland diet, as tolerated, if the pain is related to the stomach or intestine. °· Have a thermometer and take your temperature several times a day, and record it. °· Bed rest and sleep, if it helps the pain. °· Avoid sexual intercourse, if it causes pain. °· Avoid stressful situations. °· Keep your follow-up appointments and tests, as your caregiver orders. °· If the pain does not go away with medicine or surgery, you may try: °· Acupuncture. °· Relaxation exercises (yoga, meditation). °· Group therapy. °· Counseling. °SEEK MEDICAL CARE IF:  °· You notice certain foods cause stomach pain. °· Your home care treatment is not helping your pain. °· You need stronger pain medicine. °· You want your IUD removed. °· You feel faint or   lightheaded. °· You develop nausea and vomiting. °· You develop a rash. °· You are having side effects or an allergy to your medicine. °SEEK IMMEDIATE MEDICAL CARE IF:  °· Your  pain does not go away or gets worse. °· You have a fever. °· Your pain is felt only in portions of the abdomen. The right side could possibly be appendicitis. The left lower portion of the abdomen could be colitis or diverticulitis. °· You are passing blood in your stools (bright red or black tarry stools, with or without vomiting). °· You have blood in your urine. °· You develop chills, with or without a fever. °· You pass out. °MAKE SURE YOU:  °· Understand these instructions. °· Will watch your condition. °· Will get help right away if you are not doing well or get worse. °Document Released: 03/09/2007 Document Revised: 08/04/2011 Document Reviewed: 03/29/2009 °[employer]® Patient Information ©2014 [employer]. ° °

## 2013-09-13 NOTE — ED Provider Notes (Signed)
CSN: [id number]     Arrival date & time 09/13/13  1452 History   First MD Initiated Contact with Patient 09/13/13 1601     Chief Complaint  Patient presents with  . Nausea   (Consider location/radiation/quality/duration/timing/severity/associated sxs/prior Treatment) HPI Comments: 73f presents for eval of abdominal pain, nausea, headache.  These symptoms have been present for 3 days now, getting slightly worse over that time.  She feels the pain across the lower abdomen.  She has nausea without vomiting, diarrhea, or constipation.  She also admits to some urinary frequency and urgency.  She denies any vaginal discharge.  She admits to possibility of pregnancy.  No fever at home.     Past Medical History  Diagnosis Date  . Seizures   . Alcohol abuse    Past Surgical History  Procedure Laterality Date  . Back surgery     No family history on file. History  Substance Use Topics  . Smoking status: Never Smoker   . Smokeless tobacco: Not on file  . Alcohol Use: Yes     Comment: presently in [place] for recovery   OB History   Grav Para Term Preterm Abortions TAB SAB Ect Mult Living                 Review of Systems  Gastrointestinal: Positive for nausea and abdominal pain.  Genitourinary: Positive for urgency and frequency.  Neurological: Positive for headaches.  All other systems reviewed and are negative.   Allergies  Review of patient's allergies indicates no known allergies.  Home Medications   Prior to Admission medications   Medication Sig Start Date End Date Taking? Authorizing Provider  divalproex (DEPAKOTE ER) 250 MG 24 hr tablet Take 750 mg by mouth at bedtime.   Yes Historical Provider, MD  risperiDONE (RISPERDAL) 1 MG tablet Take 1 mg by mouth at bedtime.   Yes Historical Provider, MD  cephALEXin (KEFLEX) 500 MG capsule Take 1 capsule (500 mg total) by mouth 4 (four) times daily. 06/17/13   Illene Labrador, PA-C  ibuprofen (ADVIL,MOTRIN) 800 MG tablet Take 1,600  mg by mouth every 6 (six) hours as needed for mild pain or cramping.    Historical Provider, MD  polyethylene glycol powder (MIRALAX) powder Take 17 g by mouth daily. 06/25/13   Gregor Hams, MD   BP 142/89  Pulse 82  Temp(Src) 98.3 F (36.8 C)  Resp 16  SpO2 100%  LMP 09/03/2013 Physical Exam  Nursing note and vitals reviewed. Constitutional: She is oriented to person, place, and time. Vital signs are normal. She appears well-developed and well-nourished. No distress.  HENT:  Head: Normocephalic and atraumatic.  Cardiovascular: Normal rate, regular rhythm and normal heart sounds.  Exam reveals no gallop and no friction rub.   No murmur heard. Pulmonary/Chest: Effort normal and breath sounds normal. No respiratory distress. She has no wheezes. She has no rales.  Abdominal: Soft. Bowel sounds are normal. She exhibits no distension and no mass. There is tenderness (minimal suprapubic ). There is no rebound and no guarding.  Neurological: She is alert and oriented to person, place, and time. She has normal strength. Coordination normal.  Skin: Skin is warm and dry. No rash noted. She is not diaphoretic.  Psychiatric: She has a normal mood and affect. Judgment normal.    ED Course  Procedures (including critical care time) Labs Review Labs Reviewed  GLUCOSE, CAPILLARY  POCT URINALYSIS DIP (DEVICE)  POCT PREGNANCY, URINE    Results  for orders placed during the hospital encounter of 09/13/13  GLUCOSE, CAPILLARY      Result Value Ref Range   Glucose-Capillary 72  70 - 99 mg/dL  POCT URINALYSIS DIP (DEVICE)      Result Value Ref Range   Glucose, UA NEGATIVE  NEGATIVE mg/dL   Bilirubin Urine NEGATIVE  NEGATIVE   Ketones, ur NEGATIVE  NEGATIVE mg/dL   Specific Gravity, Urine 1.010  1.005 - 1.030   Hgb urine dipstick NEGATIVE  NEGATIVE   pH 7.0  5.0 - 8.0   Protein, ur NEGATIVE  NEGATIVE mg/dL   Urobilinogen, UA 0.2  0.0 - 1.0 mg/dL   Nitrite NEGATIVE  NEGATIVE   Leukocytes, UA  NEGATIVE  NEGATIVE  POCT PREGNANCY, URINE      Result Value Ref Range   Preg Test, Ur NEGATIVE  NEGATIVE   Imaging Review No results found.   MDM   1. Nausea   2. Abdominal pain   3. Headache   4. Viral syndrome     Attempted to draw a CBC and i-Stat but were unsuccessful.  She is afebrile, nontoxic, in minimal distress. Watchful waiting at this point, Zofran for nausea. She will go to the emergency room if she is getting any worse.  Meds ordered this encounter  Medications  . ondansetron (ZOFRAN ODT) 8 MG disintegrating tablet    Sig: Take 1 tablet (8 mg total) by mouth every 8 (eight) hours as needed for nausea or vomiting.    Dispense:  12 tablet    Refill:  0    Order Specific Question:  Supervising Provider    Answer:  Ihor Gully D [place], PA-C 09/13/13 2049

## 2013-09-13 NOTE — ED Notes (Signed)
Patient complains of nausea and stomach pain that started Saturday; states some headache and polyuria; denies diarrhea, f/c.

## 2013-09-14 NOTE — ED Provider Notes (Signed)
Medical screening examination/treatment/procedure(s) were performed by non-physician practitioner and as supervising physician I was immediately available for consultation/collaboration.  Philipp Deputy, M.D.  Harden Mo, MD 09/14/13 0001

## 2013-09-15 ENCOUNTER — Ambulatory Visit
Admission: RE | Admit: 2013-09-15 | Discharge: 2013-09-15 | Disposition: A | Discharge status: Home / Self Care | Payer: Medicare HMO | Source: Ambulatory Visit

## 2013-09-15 DIAGNOSIS — Z1231 Encounter for screening mammogram for malignant neoplasm of breast: Secondary | ICD-10-CM

## 2013-09-21 ENCOUNTER — Encounter: Payer: Self-pay | Admitting: Obstetrics

## 2013-10-11 ENCOUNTER — Ambulatory Visit (INDEPENDENT_AMBULATORY_CARE_PROVIDER_SITE_OTHER): Payer: Commercial Managed Care - HMO | Admitting: Obstetrics

## 2013-10-11 ENCOUNTER — Encounter: Payer: Self-pay | Admitting: Obstetrics

## 2013-10-11 VITALS — BP 125/86 | HR 93 | Temp 98.2°F | Ht 61.0 in | Wt 144.0 lb

## 2013-10-11 DIAGNOSIS — R87610 Atypical squamous cells of undetermined significance on cytologic smear of cervix (ASC-US): Secondary | ICD-10-CM

## 2013-10-12 ENCOUNTER — Encounter: Payer: Self-pay | Admitting: Obstetrics

## 2013-10-12 DIAGNOSIS — R87610 Atypical squamous cells of undetermined significance on cytologic smear of cervix (ASC-US): Secondary | ICD-10-CM | POA: Insufficient documentation

## 2013-10-12 NOTE — Progress Notes (Signed)
Pt notified about lab results.  Referred by Dr. Criss Rosales for follow up of ASCUS pap with negative High Risk HPV DNA.  A/P:  ASCUS with negative High Risk HPV DNA.          Repeat pap q 6 months.

## 2013-11-09 ENCOUNTER — Other Ambulatory Visit: Payer: Self-pay | Admitting: Obstetrics

## 2013-11-09 ENCOUNTER — Other Ambulatory Visit: Payer: Self-pay

## 2013-11-10 ENCOUNTER — Telehealth: Payer: Self-pay | Admitting: *Deleted

## 2013-11-10 NOTE — Telephone Encounter (Signed)
Called patient from her call this am- patient states she had a prolonged cycle that lasting 8 days instead of her normal 4 days and she wanted to know what could be causing this. Reviewed reasons she could have had prolonged bleeding-  fibroid uterus, missed pregnancy ( not likely BTL), hormonal changes, stress, medication changes, infection( no change in partner). Patient is to monitor her next cycle and call the office for an appointment if she feels she needs.

## 2013-11-15 ENCOUNTER — Emergency Department (HOSPITAL_COMMUNITY)
Admission: EM | Admit: 2013-11-15 | Discharge: 2013-11-15 | Discharge status: Absent without leave | Payer: Medicare HMO | Source: Home / Self Care

## 2013-12-14 ENCOUNTER — Encounter (HOSPITAL_COMMUNITY): Payer: Self-pay | Admitting: Emergency Medicine

## 2013-12-14 ENCOUNTER — Emergency Department (HOSPITAL_COMMUNITY)
Admission: EM | Admit: 2013-12-14 | Discharge: 2013-12-14 | Disposition: A | Discharge status: Home / Self Care | Payer: Medicare HMO | Attending: Emergency Medicine | Admitting: Emergency Medicine

## 2013-12-14 DIAGNOSIS — N643 Galactorrhea not associated with childbirth: Secondary | ICD-10-CM | POA: Insufficient documentation

## 2013-12-14 DIAGNOSIS — Z79899 Other long term (current) drug therapy: Secondary | ICD-10-CM | POA: Insufficient documentation

## 2013-12-14 DIAGNOSIS — Z3200 Encounter for pregnancy test, result unknown: Secondary | ICD-10-CM | POA: Diagnosis present

## 2013-12-14 DIAGNOSIS — R358 Other polyuria: Secondary | ICD-10-CM | POA: Insufficient documentation

## 2013-12-14 DIAGNOSIS — R3589 Other polyuria: Secondary | ICD-10-CM | POA: Diagnosis not present

## 2013-12-14 DIAGNOSIS — G40909 Epilepsy, unspecified, not intractable, without status epilepticus: Secondary | ICD-10-CM | POA: Diagnosis not present

## 2013-12-14 LAB — URINALYSIS, ROUTINE W REFLEX MICROSCOPIC
Glucose, UA: NEGATIVE mg/dL
Hgb urine dipstick: NEGATIVE
Ketones, ur: NEGATIVE mg/dL
Nitrite: NEGATIVE
Protein, ur: NEGATIVE mg/dL
Specific Gravity, Urine: 1.015 (ref 1.005–1.030)
Urobilinogen, UA: 1 mg/dL (ref 0.0–1.0)
pH: 7 (ref 5.0–8.0)

## 2013-12-14 LAB — URINE MICROSCOPIC-ADD ON

## 2013-12-14 LAB — PREGNANCY, URINE: Preg Test, Ur: NEGATIVE

## 2013-12-14 NOTE — ED Notes (Signed)
Pt in stating she thinks she is pregnant due to abdominal distention, frequent urination, and milk in her breasts, states her LMP was the beginning of July but states it was short.

## 2013-12-14 NOTE — ED Provider Notes (Signed)
CSN: [id number]     Arrival date & time 12/14/13  1649 History   First MD Initiated Contact with Patient 12/14/13 1920     Chief Complaint  Patient presents with  . Possible Pregnancy     (Consider location/radiation/quality/duration/timing/severity/associated sxs/prior Treatment) HPI Comments: Melissa Dougherty is a 43 y.o. Female with a PMHx of seizures and ETOH abuse presenting today with concern for pregnancy. States she noticed some milky white d/c from b/l breasts beginning last week which occurs when she squeezes her breasts. She also states she feels bloated, so she wanted to check if she was pregnant. Did not take any home pregnancy tests prior to arrival. LMP was 7/1-5/15 which was "normal" for her, endorses that she is sexually active with one female partner, which is always unprotected. No vaginal discharge or bleeding. States she's been urinating more frequently but denies hematuria or dysuria. States she's been on Depakote since 1995, and she see's Dr. Criss Rosales for med management. Denies fevers, chills, fatigue, CP, SOB, abd pain, N/V/D/C, hematuria, dysuria, vaginal bleeding or discharge, HA, syncope/presyncope, lightheadedness, or seizures. Denies any breast tenderness.  Patient is a 42 y.o. female presenting with pregnancy problem. The history is provided by the patient. No language interpreter was used.  Possible Pregnancy The current episode started more than 2 days ago. The problem has been unchanged. Episode is mild. Gestational age is unknown. Patient reports no abdominal pain, no contractions, no decreased fetal movement, no leaking fluid, no vaginal bleeding and no vaginal discharge. There has been no prenatal care.  Associated symptoms include no dysuria, no fever, no headaches, no light-headedness, no malaise, no nausea, no shortness of breath, no syncope, no vomiting and no weakness.     Past Medical History  Diagnosis Date  . Seizures   . Alcohol abuse    Past Surgical  History  Procedure Laterality Date  . Back surgery    . Tubal ligation     Family History  Problem Relation Age of Onset  . Diabetes Mother   . Hypertension Mother    History  Substance Use Topics  . Smoking status: Never Smoker   . Smokeless tobacco: Not on file  . Alcohol Use: Yes     Comment: presently in [place] for recovery   OB History   Grav Para Term Preterm Abortions TAB SAB Ect Mult Living   2 2 2       2      Review of Systems  Constitutional: Negative for fever, chills and fatigue.  Respiratory: Negative for chest tightness and shortness of breath.   Cardiovascular: Negative for chest pain and syncope.  Gastrointestinal: Negative for nausea, vomiting, abdominal pain, diarrhea and constipation.  Endocrine:       Galactorrhea  Genitourinary: Positive for frequency. Negative for dysuria, urgency, hematuria, flank pain, decreased urine volume, vaginal bleeding, vaginal discharge, difficulty urinating, vaginal pain, menstrual problem and pelvic pain.  Musculoskeletal: Negative for arthralgias and myalgias.  Skin: Negative for color change.  Neurological: Negative for dizziness, seizures, syncope, weakness, light-headedness and headaches.  Psychiatric/Behavioral: Negative for confusion.  10 Systems reviewed and are negative for acute change except as noted in the HPI.     Allergies  Review of patient's allergies indicates no known allergies.  Home Medications   Prior to Admission medications   Medication Sig Start Date End Date Taking? Authorizing Provider  cholecalciferol (VITAMIN D) 1000 UNITS tablet Take 1,000 Units by mouth daily.   Yes Historical Provider, MD  divalproex (  DEPAKOTE ER) 250 MG 24 hr tablet Take 250 mg by mouth at bedtime.    Yes Historical Provider, MD  risperiDONE (RISPERDAL) 1 MG tablet Take 1 mg by mouth at bedtime.   Yes Historical Provider, MD   BP 111/65  Pulse 93  Temp(Src) 98.2 F (36.8 C) (Oral)  Resp 20  Ht 5\' 1"  (1.549 m)  Wt  144 lb (65.318 kg)  BMI 27.22 kg/m2  SpO2 100%  LMP 11/23/2013 Physical Exam  Nursing note and vitals reviewed. Constitutional: She is oriented to person, place, and time. Vital signs are normal. She appears well-developed and well-nourished. No distress.  VSS, afebrile, NAD  HENT:  Head: Normocephalic and atraumatic.  Mouth/Throat: Mucous membranes are normal.  Eyes: Conjunctivae and EOM are normal. Right eye exhibits no discharge. Left eye exhibits no discharge.  Neck: Normal range of motion. Neck supple.  Cardiovascular: Normal rate and intact distal pulses.   Pulmonary/Chest: Effort normal.  Abdominal: Soft. Normal appearance and bowel sounds are normal. She exhibits no distension. There is no tenderness. There is no rigidity, no rebound, no guarding and no CVA tenderness.  Uterus not palpated above pubic symphysis. Soft, NT/ND, no r/g/r, no CVA TTP  Genitourinary: There is breast discharge. No breast swelling, tenderness or bleeding.  Milky discharge noted with squeezing of b/l breasts/nipples. Non-TTP, no swelling or bleeding.   Musculoskeletal: Normal range of motion.  Neurological: She is alert and oriented to person, place, and time.  Skin: Skin is warm, dry and intact. No rash noted.  Psychiatric: She has a normal mood and affect.    ED Course  Procedures (including critical care time) Labs Review Labs Reviewed  URINALYSIS, ROUTINE W REFLEX MICROSCOPIC - Abnormal; Notable for the following:    Bilirubin Urine SMALL (*)    Leukocytes, UA SMALL (*)    All other components within normal limits  URINE MICROSCOPIC-ADD ON - Abnormal; Notable for the following:    Squamous Epithelial / LPF FEW (*)    All other components within normal limits  PREGNANCY, URINE    Imaging Review No results found.   EKG Interpretation None      MDM   Final diagnoses:  Galactorrhea in female  Seizure disorder    Melissa Dougherty is a 43 y.o. female with a PMHx of seizure disorder  on risperdal and depakote presenting today with galactorrhea and concern for pregnancy. No abd pain, and abd exam benign. Milky discharge noted with squeezing of b/l breasts. Upreg neg, U/A negative for infection, appears to be a dirty catch which would account for the small leuks noted, no WBC or bacteria. I believe this galactorrhea is a side effect of her medication, but I will not obtain PRL level today due to the the fact that this test takes longer than the ED course to return, and is not an emergent concern. Discussed the need for this with her PCP, whom she sees regularly. Discussed using protection during sexual encounters. Strict return precautions given. Stable at time of d/c.  BP 111/65  Pulse 93  Temp(Src) 98.2 F (36.8 C) (Oral)  Resp 20  Ht 5\' 1"  (1.549 m)  Wt 144 lb (65.318 kg)  BMI 27.22 kg/m2  SpO2 100%  LMP 11/23/2013   Patty Sermons [place], PA-C 12/15/13 [phone]

## 2013-12-14 NOTE — Discharge Instructions (Signed)
Your condition could be caused by your depakote. Continue taking your medications, but see your primary doctor to discuss further work up and care for this problem. You were not pregnant here today. Return to the emergency department for any changes or worsening symptoms.   Galactorrhea Galactorrhea is when there is a milky nipple discharge. It is different from normal milk in nursing mothers. It usually comes from both nipples. Galactorrhea is not a disease but may be a symptom of a problem. It may continue for years after weaning. Galactorrhea is caused by the hormone prolactin, which stimulates milk production. If the breast discharge looks like pus, is bloody or if there is a lump present in the affected breast, the discharge may be caused by other problems including:  A benign cyst.  Papilloma.  Breast cancer.  A breast infection.  A breast abscess. It can also be seen in men who have a low or absent female hormone (testosterone) level. Galactorrhea can be present in a newborn if the mother had high female hormone (estrogen) levels that crossed into the baby through the placenta. The baby usually has enlarged breasts, but in time, it all goes away on its own. CAUSES   Tumor of the pituitary gland in the brain.  Problems with the hypothalamus in the brain that stimulates the pituitary gland.  Low thyroid function (hypothyroid disease).  Chronic kidney failure.  Medications, antidepressants, tranquilizers and blood pressure medication.  Herbal medications (nettle, fennel, blessed thistle, anise and fenugreek seed).  Illegal drugs (marijuana and opiates).  Breast stimulation during sexual activity or too many and frequent self breast exams.  Birth control pills.  Surgery or trauma to the breast causing nerve damage.  Spinal cord injury. SYMPTOMS   White, yellow or green discharge from one or both breasts.  No menstrual period (amenorrhea) or infrequent menstrual periods  (hypomenorrhea).  Hot flashes, lack of sexual desire or vaginal dryness.  Infertility in women and men.  Headaches and vision problems.  Decrease in calcium in your bones (developing osteopenia or osteoporosis). DIAGNOSIS  Your caregiver may be able to know your problem by taking a detailed history and physical exam of you. Tests that may be done, include:  Blood tests to check for the prolactin hormone, your female and thyroid hormones and a pregnancy test.  A detailed eye exam.  Mammogram.  X-rays, CT scan or MRI of breasts or your brain looking for a tumor. TREATMENT   Stopping medications that may be causing the galactorrhea.  Treating low thyroid function with thyroid hormones.  Medical or surgical (if necessary) treatment of a pituitary gland tumor.  Medication to lower the prolactin hormone level when no cause can be found.  Surgery as a last resort to remove the breasts ducts if the discharge persists with treatment and is a problem.  Treatment may not be necessary if you are not bothered by the breast discharge. HOME CARE INSTRUCTIONS   Before seeing your caregiver, make a list of all your symptoms, medications, when the breast discharge started and questions you may have.  Avoid breast stimulation during sexual activity.  Perform breast self exam once a month.  Avoid clothes that rub on your nipples.  Use breasts pads to absorb the discharge.  Wear a support bra. SEEK MEDICAL CARE IF:   You have galactorrhea and you are trying to get pregnant.  You develop hot flashes, vaginal dryness or lack of sexual desire.  You stop having menstrual periods or they are  irregular or far apart.  You have headaches.  You have vision problems. SEEK IMMEDIATE MEDICAL CARE IF:   Your breast discharge is bloody or pus-like.  You have breast pain.  You feel a lump in your breast.  Your breast shows wrinkling or dimpling.  Your breast becomes red and  swollen. Document Released: 06/19/2004 Document Revised: 08/04/2011 Document Reviewed: 05/02/2008 [facility] Patient Information 2015 [place], [place]. This information is not intended to replace advice given to you by your health care provider. Make sure you discuss any questions you have with your health care provider.

## 2013-12-15 NOTE — ED Provider Notes (Signed)
Medical screening examination/treatment/procedure(s) were performed by non-physician practitioner and as supervising physician I was immediately available for consultation/collaboration.   EKG Interpretation None       Orlie Dakin, MD 12/15/13 [phone]

## 2013-12-19 ENCOUNTER — Emergency Department (HOSPITAL_COMMUNITY)
Admission: EM | Admit: 2013-12-19 | Discharge: 2013-12-19 | Disposition: A | Discharge status: Home / Self Care | Payer: Medicare HMO | Attending: Emergency Medicine | Admitting: Emergency Medicine

## 2013-12-19 ENCOUNTER — Encounter (HOSPITAL_COMMUNITY): Payer: Self-pay | Admitting: Emergency Medicine

## 2013-12-19 DIAGNOSIS — G4700 Insomnia, unspecified: Secondary | ICD-10-CM | POA: Diagnosis present

## 2013-12-19 DIAGNOSIS — G40909 Epilepsy, unspecified, not intractable, without status epilepticus: Secondary | ICD-10-CM | POA: Insufficient documentation

## 2013-12-19 DIAGNOSIS — Z79899 Other long term (current) drug therapy: Secondary | ICD-10-CM | POA: Diagnosis not present

## 2013-12-19 DIAGNOSIS — F411 Generalized anxiety disorder: Secondary | ICD-10-CM | POA: Insufficient documentation

## 2013-12-19 DIAGNOSIS — Z7689 Persons encountering health services in other specified circumstances: Secondary | ICD-10-CM

## 2013-12-19 DIAGNOSIS — G479 Sleep disorder, unspecified: Secondary | ICD-10-CM | POA: Diagnosis not present

## 2013-12-19 NOTE — ED Provider Notes (Signed)
CSN: [id number]     Arrival date & time 12/19/13  1753 History   First MD Initiated Contact with Patient 12/19/13 1853     Chief Complaint  Patient presents with  . Insomnia  . Anxiety     (Consider location/radiation/quality/duration/timing/severity/associated sxs/prior Treatment) HPI Comments: The patient is a 43 year old female past no history of alcohol abuse and seizure disorder presents emergency room chief complaint of insomnia. The patient reports abnormal sleep patterns for approximately 4 months. She reports going to bed at 9 PM and waking up at 1 AM, previously slept from 9 AM to 5 AM. She reports she is unable to fall back asleep. She reports taking Risperdal in the past but was changed to 2 galactorrhea. She also complains of decreased concentration do to lack of sleep. She denies SI, HI, harm to self or others, hallucinations auditory or visual.  She reports being evaluated by [facility] for similar complaints as well as her primary care since her discharge on 7/22. Reports last EtOH use was 04/2013 denies EtOH use. Last seizure in 1992.  Patient is a 43 y.o. female presenting with anxiety. The history is provided by the patient. No language interpreter was used.  Anxiety Pertinent negatives include no chills, fever, headaches, numbness or weakness.    Past Medical History  Diagnosis Date  . Seizures   . Alcohol abuse    Past Surgical History  Procedure Laterality Date  . Back surgery    . Tubal ligation     Family History  Problem Relation Age of Onset  . Diabetes Mother   . Hypertension Mother    History  Substance Use Topics  . Smoking status: Never Smoker   . Smokeless tobacco: Not on file  . Alcohol Use: No   OB History   Grav Para Term Preterm Abortions TAB SAB Ect Mult Living   2 2 2       2      Review of Systems  Constitutional: Negative for fever and chills.  Neurological: Negative for weakness, numbness and headaches.  Psychiatric/Behavioral:  Positive for sleep disturbance and decreased concentration. Negative for suicidal ideas, hallucinations and self-injury. The patient is not hyperactive.       Allergies  Review of patient's allergies indicates no known allergies.  Home Medications   Prior to Admission medications   Medication Sig Start Date End Date Taking? Authorizing Provider  cholecalciferol (VITAMIN D) 1000 UNITS tablet Take 1,000 Units by mouth daily.   Yes Historical Provider, MD  divalproex (DEPAKOTE ER) 250 MG 24 hr tablet Take 250 mg by mouth at bedtime.    Yes Historical Provider, MD  risperiDONE (RISPERDAL) 1 MG tablet Take 1 mg by mouth at bedtime.   Yes Historical Provider, MD   BP 104/74  Pulse 106  Temp(Src) 98.3 F (36.8 C) (Oral)  Resp 18  SpO2 100%  LMP 11/23/2013 Physical Exam  Nursing note and vitals reviewed. Constitutional: She is oriented to person, place, and time. She appears well-developed and well-nourished.  Non-toxic appearance. She does not have a sickly appearance. She does not appear ill. No distress.  Pt sleeping on gurney.  HENT:  Head: Normocephalic and atraumatic.  Eyes: EOM are normal. Pupils are equal, round, and reactive to light. Right eye exhibits no discharge. Left eye exhibits no discharge. No scleral icterus.  Neck: Normal range of motion. Neck supple.  Cardiovascular: Normal rate and regular rhythm.   Pulmonary/Chest: Effort normal and breath sounds normal. She has no  wheezes. She has no rales. She exhibits no tenderness.  Abdominal: Soft. Bowel sounds are normal. She exhibits no distension. There is no tenderness. There is no rebound and no guarding.  Musculoskeletal: Normal range of motion. She exhibits no edema.  Neurological: She is alert and oriented to person, place, and time. She is not disoriented. No cranial nerve deficit or sensory deficit.  Skin: Skin is warm and dry. No rash noted. She is not diaphoretic.  Psychiatric: She has a normal mood and affect.     ED Course  Procedures (including critical care time) Labs Review Labs Reviewed - No data to display  Imaging Review No results found.   EKG Interpretation None      MDM   Final diagnoses:  Sleep concern   Patient complains of insomnia for several months. Well-established at outpatient care. Patient is contracted to self. She is sleeping in the ED. Pt contracts for safety. Discussed patient history, condition with Dr. Regenia Skeeter, who also agrees the patient does not meet inpatient criteria for admission. Reevaluation patient sleeping began in the ED. Discussed the patient does not meet inpatient criteria the patient for psych evaluation and to followup outpatient with PCP or her [facility] team members. Discussed treatment plan with the patient. Return precautions given. Reports understanding and no other concerns at this time.  Patient is stable for discharge at this time.      Lorrine Kin, PA-C 12/20/13 [phone]

## 2013-12-19 NOTE — Discharge Instructions (Signed)
Call for a follow up appointment with a Family or Primary Care Provider.  °Return if Symptoms worsen.   °Take medication as prescribed.  ° °

## 2013-12-19 NOTE — ED Notes (Signed)
Initial Contact - pt resting soundly on stretcher, c/o stressors at home and difficulty sleeping x5 months.  Pt denies SI/HI or other complaints.  Skin PWD.  Speaking full/clear sentences, rr even/un-lab.  MAEI, self repositioning for comfort.  NAD.

## 2013-12-19 NOTE — ED Notes (Signed)
Pt presents with c/o insomnia. Pt says that she has not slept through the night in 5 months. Pt says that she does take risperdal but is still unable to sleep. Pt says that she works and goes to school as well and still has not been able to sleep. Pt denies any HI/SI but just says that she wants to get some help to sleep through the night and sleep. Pt says that she is overwhelmed and feeling some anxiety. Pt is tearful in triage at this time.

## 2013-12-19 NOTE — ED Notes (Signed)
Collected Urine on Pt. Sample is at nurses station in "[place] [facility]" slot.

## 2013-12-19 NOTE — ED Notes (Signed)
Pt sleeping soundly.

## 2013-12-20 NOTE — ED Provider Notes (Signed)
Medical screening examination/treatment/procedure(s) were performed by non-physician practitioner and as supervising physician I was immediately available for consultation/collaboration.   EKG Interpretation None        Ephraim Hamburger, MD 12/20/13 1541

## 2013-12-22 ENCOUNTER — Emergency Department (HOSPITAL_COMMUNITY)
Admission: EM | Admit: 2013-12-22 | Discharge: 2013-12-22 | Disposition: A | Discharge status: Home / Self Care | Payer: Medicare HMO | Source: Home / Self Care | Attending: Emergency Medicine | Admitting: Emergency Medicine

## 2013-12-22 ENCOUNTER — Observation Stay (HOSPITAL_COMMUNITY)
Admission: AD | Admit: 2013-12-22 | Discharge: 2013-12-23 | Disposition: A | Discharge status: Home / Self Care | Payer: Medicare HMO | Source: Intra-hospital | Attending: Psychiatry | Admitting: Psychiatry

## 2013-12-22 ENCOUNTER — Encounter (HOSPITAL_COMMUNITY): Payer: Self-pay | Admitting: *Deleted

## 2013-12-22 ENCOUNTER — Encounter (HOSPITAL_COMMUNITY): Payer: Self-pay | Admitting: Emergency Medicine

## 2013-12-22 DIAGNOSIS — R569 Unspecified convulsions: Secondary | ICD-10-CM | POA: Insufficient documentation

## 2013-12-22 DIAGNOSIS — Z91199 Patient's noncompliance with other medical treatment and regimen due to unspecified reason: Secondary | ICD-10-CM | POA: Insufficient documentation

## 2013-12-22 DIAGNOSIS — IMO0002 Reserved for concepts with insufficient information to code with codable children: Secondary | ICD-10-CM | POA: Insufficient documentation

## 2013-12-22 DIAGNOSIS — F309 Manic episode, unspecified: Secondary | ICD-10-CM

## 2013-12-22 DIAGNOSIS — F329 Major depressive disorder, single episode, unspecified: Secondary | ICD-10-CM | POA: Diagnosis not present

## 2013-12-22 DIAGNOSIS — F1021 Alcohol dependence, in remission: Secondary | ICD-10-CM | POA: Insufficient documentation

## 2013-12-22 DIAGNOSIS — R454 Irritability and anger: Secondary | ICD-10-CM

## 2013-12-22 DIAGNOSIS — Z87898 Personal history of other specified conditions: Secondary | ICD-10-CM | POA: Insufficient documentation

## 2013-12-22 DIAGNOSIS — R5381 Other malaise: Secondary | ICD-10-CM

## 2013-12-22 DIAGNOSIS — Z9119 Patient's noncompliance with other medical treatment and regimen: Secondary | ICD-10-CM

## 2013-12-22 DIAGNOSIS — Z79899 Other long term (current) drug therapy: Secondary | ICD-10-CM | POA: Insufficient documentation

## 2013-12-22 DIAGNOSIS — G4700 Insomnia, unspecified: Secondary | ICD-10-CM

## 2013-12-22 DIAGNOSIS — R5383 Other fatigue: Secondary | ICD-10-CM | POA: Insufficient documentation

## 2013-12-22 DIAGNOSIS — F4320 Adjustment disorder, unspecified: Secondary | ICD-10-CM | POA: Insufficient documentation

## 2013-12-22 DIAGNOSIS — R45851 Suicidal ideations: Secondary | ICD-10-CM | POA: Insufficient documentation

## 2013-12-22 DIAGNOSIS — F3289 Other specified depressive episodes: Secondary | ICD-10-CM | POA: Diagnosis not present

## 2013-12-22 DIAGNOSIS — K219 Gastro-esophageal reflux disease without esophagitis: Secondary | ICD-10-CM

## 2013-12-22 DIAGNOSIS — R87610 Atypical squamous cells of undetermined significance on cytologic smear of cervix (ASC-US): Secondary | ICD-10-CM

## 2013-12-22 DIAGNOSIS — F1994 Other psychoactive substance use, unspecified with psychoactive substance-induced mood disorder: Secondary | ICD-10-CM

## 2013-12-22 DIAGNOSIS — I252 Old myocardial infarction: Secondary | ICD-10-CM | POA: Diagnosis not present

## 2013-12-22 DIAGNOSIS — Z3202 Encounter for pregnancy test, result negative: Secondary | ICD-10-CM | POA: Insufficient documentation

## 2013-12-22 DIAGNOSIS — F32A Depression, unspecified: Secondary | ICD-10-CM

## 2013-12-22 DIAGNOSIS — Z8669 Personal history of other diseases of the nervous system and sense organs: Secondary | ICD-10-CM

## 2013-12-22 DIAGNOSIS — F1910 Other psychoactive substance abuse, uncomplicated: Secondary | ICD-10-CM

## 2013-12-22 DIAGNOSIS — F4323 Adjustment disorder with mixed anxiety and depressed mood: Secondary | ICD-10-CM

## 2013-12-22 DIAGNOSIS — G40909 Epilepsy, unspecified, not intractable, without status epilepticus: Secondary | ICD-10-CM | POA: Insufficient documentation

## 2013-12-22 LAB — RAPID URINE DRUG SCREEN, HOSP PERFORMED
Amphetamines: NOT DETECTED
Barbiturates: NOT DETECTED
Benzodiazepines: NOT DETECTED
Cocaine: NOT DETECTED
Opiates: NOT DETECTED
Tetrahydrocannabinol: NOT DETECTED

## 2013-12-22 LAB — CBC WITH DIFFERENTIAL/PLATELET
Basophils Absolute: 0.1 10*3/uL (ref 0.0–0.1)
Basophils Relative: 1 % (ref 0–1)
Eosinophils Absolute: 0.2 10*3/uL (ref 0.0–0.7)
Eosinophils Relative: 3 % (ref 0–5)
HCT: 42 % (ref 36.0–46.0)
Hemoglobin: 14.2 g/dL (ref 12.0–15.0)
Lymphocytes Relative: 49 % — ABNORMAL HIGH (ref 12–46)
Lymphs Abs: 3.1 10*3/uL (ref 0.7–4.0)
MCH: 22.6 pg — ABNORMAL LOW (ref 26.0–34.0)
MCHC: 33.8 g/dL (ref 30.0–36.0)
MCV: 66.8 fL — ABNORMAL LOW (ref 78.0–100.0)
Monocytes Absolute: 0.4 10*3/uL (ref 0.1–1.0)
Monocytes Relative: 7 % (ref 3–12)
Neutro Abs: 2.5 10*3/uL (ref 1.7–7.7)
Neutrophils Relative %: 40 % — ABNORMAL LOW (ref 43–77)
Platelets: 183 10*3/uL (ref 150–400)
RBC: 6.29 MIL/uL — ABNORMAL HIGH (ref 3.87–5.11)
RDW: 18.2 % — ABNORMAL HIGH (ref 11.5–15.5)
WBC: 6.3 10*3/uL (ref 4.0–10.5)

## 2013-12-22 LAB — COMPREHENSIVE METABOLIC PANEL
ALT: 18 U/L (ref 0–35)
AST: 28 U/L (ref 0–37)
Albumin: 3.7 g/dL (ref 3.5–5.2)
Alkaline Phosphatase: 72 U/L (ref 39–117)
Anion gap: 16 — ABNORMAL HIGH (ref 5–15)
BUN: 10 mg/dL (ref 6–23)
CO2: 20 mEq/L (ref 19–32)
Calcium: 9.4 mg/dL (ref 8.4–10.5)
Chloride: 98 mEq/L (ref 96–112)
Creatinine, Ser: 0.92 mg/dL (ref 0.50–1.10)
GFR calc Af Amer: 88 mL/min — ABNORMAL LOW (ref >90)
GFR calc non Af Amer: 76 mL/min — ABNORMAL LOW (ref >90)
Glucose, Bld: 81 mg/dL (ref 70–99)
Potassium: 4.1 mEq/L (ref 3.7–5.3)
Sodium: 134 mEq/L — ABNORMAL LOW (ref 137–147)
Total Bilirubin: 0.4 mg/dL (ref 0.3–1.2)
Total Protein: 7.9 g/dL (ref 6.0–8.3)

## 2013-12-22 LAB — VALPROIC ACID LEVEL: Valproic Acid Lvl: 14.3 ug/mL — ABNORMAL LOW (ref 50.0–100.0)

## 2013-12-22 LAB — ETHANOL: Alcohol, Ethyl (B): <11 mg/dL (ref 0–11)

## 2013-12-22 MED ORDER — IBUPROFEN 600 MG PO TABS
600.0000 mg | ORAL_TABLET | Freq: Three times a day (TID) | ORAL | Status: DC | PRN
  Administered 2013-12-22: 600 mg via ORAL
  Filled 2013-12-22: qty 1

## 2013-12-22 MED ORDER — RISPERIDONE 1 MG PO TABS
1.0000 mg | ORAL_TABLET | Freq: Every day | ORAL | Status: DC
  Administered 2013-12-22: 1 mg via ORAL
  Filled 2013-12-22 (×3): qty 1

## 2013-12-22 MED ORDER — TRAZODONE HCL 50 MG PO TABS
50.0000 mg | ORAL_TABLET | Freq: Every evening | ORAL | Status: DC | PRN
  Administered 2013-12-22: 50 mg via ORAL
  Filled 2013-12-22: qty 1

## 2013-12-22 MED ORDER — DIVALPROEX SODIUM ER 250 MG PO TB24
250.0000 mg | ORAL_TABLET | Freq: Every day | ORAL | Status: DC
  Administered 2013-12-22: 250 mg via ORAL
  Filled 2013-12-22 (×3): qty 1

## 2013-12-22 MED ORDER — LORAZEPAM 0.5 MG PO TABS
0.5000 mg | ORAL_TABLET | Freq: Three times a day (TID) | ORAL | Status: DC | PRN
  Administered 2013-12-22 – 2013-12-23 (×3): 0.5 mg via ORAL
  Filled 2013-12-22 (×3): qty 1

## 2013-12-22 MED ORDER — BUPROPION HCL 100 MG PO TABS
100.0000 mg | ORAL_TABLET | Freq: Two times a day (BID) | ORAL | Status: DC
  Administered 2013-12-22 – 2013-12-23 (×2): 100 mg via ORAL
  Filled 2013-12-22 (×6): qty 1
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
## 2013-12-22 NOTE — ED Notes (Signed)
Patient belongings: Shirt, pants, bra, and shoes

## 2013-12-22 NOTE — Progress Notes (Signed)
[place] INPATIENT:  Family/Significant Other Suicide Prevention Education  Suicide Prevention Education:  Patient Refusal for Family/Significant Other Suicide Prevention Education: The patient Melissa Dougherty has refused to provide written consent for family/significant other to be provided Family/Significant Other Suicide Prevention Education during admission and/or prior to discharge.  Physician notified.  Davina Poke 12/22/2013, 3:29 PM

## 2013-12-22 NOTE — ED Notes (Signed)
Unable to abtain blood at this time attempted x 2

## 2013-12-22 NOTE — Progress Notes (Signed)
Patient ID: Melissa Dougherty, female   DOB: February 12, 1971, 43 y.o.   MRN: [id number] Admission Note-Sent over from [facility] ED to OBS for medicine management/adjustment. She has not had any medications in 24 hours. She is unhappy with the services she has been receiving from [facility] and went to [facility] yesterday. She reports she has been taking Depakote for a seizure disorder for years, and Risperdal for mood. In spite of her medication compliance she says that she is hypomanic and unable to sleep and complains of being anxious and manic.She denies any thoughts to hurt self or others. She denies being psychotic.She states she has been on her current medications since she was 43 yo. She has been in a psychiatric hospital maybe five times in her life, the last time being 10 years ago. She has had a number of losses in her life but none recently. She has been working as a Museum/gallery curator and going to school to get her GED. She denies any drug or alcohol use in years, but when she was younger she used whatever she could find.Pleasant and cooperative with the admission process. Gave her a snack on admission. Oriented to the unit and the expectations of the unit.

## 2013-12-22 NOTE — ED Provider Notes (Signed)
CSN: [id number]     Arrival date & time 12/22/13  0905 History   First MD Initiated Contact with Patient 12/22/13 [phone]     Chief Complaint  Patient presents with  . racing thoughts      (Consider location/radiation/quality/duration/timing/severity/associated sxs/prior Treatment) Patient is a 43 y.o. female presenting with mental health disorder. The history is provided by the patient.  Mental Health Problem Presenting symptoms: agitation   Presenting symptoms: no aggressive behavior and no bizarre behavior   Patient accompanied by:  Family member Degree of incapacity (severity):  Moderate Onset quality:  Gradual Duration:  3 months Timing:  Constant Progression:  Worsening Chronicity:  Recurrent Context: noncompliance (out of risperal and depakote, and [facility] stopped her meds)   Treatment compliance:  Some of the time Relieved by:  Nothing Worsened by:  Nothing tried Associated symptoms: decreased need for sleep and fatigue   Associated symptoms: no abdominal pain, no anhedonia, no anxiety and no hypersomnia     Past Medical History  Diagnosis Date  . Seizures   . Alcohol abuse    Past Surgical History  Procedure Laterality Date  . Back surgery    . Tubal ligation     Family History  Problem Relation Age of Onset  . Diabetes Mother   . Hypertension Mother    History  Substance Use Topics  . Smoking status: Never Smoker   . Smokeless tobacco: Not on file  . Alcohol Use: No   OB History   Grav Para Term Preterm Abortions TAB SAB Ect Mult Living   2 2 2       2      Review of Systems  Constitutional: Positive for fatigue.  Gastrointestinal: Negative for abdominal pain.  Psychiatric/Behavioral: Positive for agitation. The patient is not nervous/anxious.   All other systems reviewed and are negative.     Allergies  Review of patient's allergies indicates no known allergies.  Home Medications   Prior to Admission medications   Medication Sig Start Date  End Date Taking? Authorizing Provider  cholecalciferol (VITAMIN D) 1000 UNITS tablet Take 1,000 Units by mouth daily.    Historical Provider, MD  divalproex (DEPAKOTE ER) 250 MG 24 hr tablet Take 250 mg by mouth at bedtime.     Historical Provider, MD  risperiDONE (RISPERDAL) 1 MG tablet Take 1 mg by mouth at bedtime.    Historical Provider, MD   BP 130/89  Pulse 90  Temp(Src) 98.7 F (37.1 C) (Oral)  Resp 18  SpO2 100%  LMP 11/23/2013 Physical Exam  Nursing note and vitals reviewed. Constitutional: She is oriented to person, place, and time. She appears well-developed and well-nourished. No distress.  HENT:  Head: Normocephalic and atraumatic.  Eyes: EOM are normal. Pupils are equal, round, and reactive to light.  Neck: Normal range of motion. Neck supple.  Cardiovascular: Normal rate and regular rhythm.  Exam reveals no friction rub.   No murmur heard. Pulmonary/Chest: Effort normal and breath sounds normal. No respiratory distress. She has no wheezes. She has no rales.  Abdominal: Soft. She exhibits no distension. There is no tenderness. There is no rebound.  Musculoskeletal: Normal range of motion. She exhibits no edema.  Neurological: She is alert and oriented to person, place, and time. No cranial nerve deficit. She exhibits normal muscle tone.  Skin: No rash noted. She is not diaphoretic.    ED Course  Procedures (including critical care time) Labs Review Labs Reviewed  CBC WITH DIFFERENTIAL  COMPREHENSIVE  METABOLIC PANEL  URINE RAPID DRUG SCREEN (HOSP PERFORMED)  ETHANOL  VALPROIC ACID LEVEL  POC URINE PREG, ED    Imaging Review No results found.   EKG Interpretation None      MDM   Final diagnoses:  Suicidal thoughts  Insomnia    43 year old female here with racing thoughts, insomnia, increased number of outbursts. This has been known for the past 3 months, but acutely worsening over the past 3-4 days. She is accompanied by her sister, who is her power  of attorney. Patient is has been taking risperidone and Depakote for her moods and help with her agitation. Risperdal was increased 2-3 months ago to help with sleeping. The increase in Risperdal did not help her insomnia, but did help her with her mood. She said she felt better, and decreased her fidgeting, and she was more focused. She has been off the Depakote over the past week, and when she followed up with her therapist 4 days ago, she was extremely emotional. She has had worsening agitation this past week after meeting with her therapist, who "grilled" her about her past weekend. Denies any drug or alcohol use. Sister reports worsening of the patient's agitation and insomnia over past 3 months despite the increase in risperdal. Patient will cal sister all hours of the day and night speaking with rapid speech, requesting things, getting mad, then calling back crying and apologizing. She got to the point where she was cursing at her mother, which deeply upset her. Patient here states she wants to be admitted because she's not sleeping and has been worsening. [facility] stopped her risperdal and depakote 4 days ago and did not give a replacement drug. Consulted TTS. TTS admitted to Observation Unit.   Osvaldo Shipper, MD 12/22/13 [phone]

## 2013-12-22 NOTE — ED Notes (Signed)
Per pt, states she has been having racing thoughts, and cusing people thought-[facility] took her off her Depakote and Risperdal not sleeping

## 2013-12-22 NOTE — ED Notes (Signed)
[facility] called 

## 2013-12-22 NOTE — ED Notes (Signed)
[facility] to transport to OBS unit

## 2013-12-22 NOTE — Progress Notes (Addendum)
Patient in bed since the beginning of the shift. 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 Writer asked both patient and mother if patient is able to communicate needs to staff; they both answered yes. Encouraged patient's and mother to always communicate need during admission process. Writer also told patient the information would be passed on to the other staff. Patient receptive to encouragement and support. She denied SI/HI and denied Hallucinations. She seemed somewhat anxious during this assessment. PRN Ativan to be given for anxiety. Q 15 minute check continues to maintain safety.  Patient was very irritable and angry about not her psychiatrist discontinuing her Depakote and Risperdal few days ago. She became very abbessed about it and was calling everyone, talking loudly about the issue. She complaint about insomnia and agitation she the medication was D/C'd.Engineer, technical sales asked patient why the doctor discontinued the medications, how long she's been taking it and what she takes the medication for. Patient stated that she's been taking the medications since she was 43 years old and she take Depakote for seizures and Risperdal for mood stabilization. Writer notified NP on call . NP ordered both medications. Patient received medications. Q 15 minutes check continue to maintain safety.

## 2013-12-22 NOTE — ED Notes (Signed)
Report to Collie Siad, RN-transfer to OBS room 5

## 2013-12-22 NOTE — ED Notes (Signed)
Called lab for blood draw

## 2013-12-22 NOTE — ED Notes (Signed)
Family at bedside. 
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
## 2013-12-22 NOTE — ED Notes (Signed)
Was unable to collect blood.

## 2013-12-23 ENCOUNTER — Inpatient Hospital Stay (HOSPITAL_COMMUNITY)
Admission: AD | Admit: 2013-12-23 | Discharge: 2013-12-28 | DRG: 885 | Disposition: A | Discharge status: Home / Self Care | Payer: Medicare HMO | Attending: Psychiatry | Admitting: Psychiatry

## 2013-12-23 ENCOUNTER — Encounter (HOSPITAL_COMMUNITY): Payer: Self-pay

## 2013-12-23 DIAGNOSIS — K219 Gastro-esophageal reflux disease without esophagitis: Secondary | ICD-10-CM | POA: Diagnosis present

## 2013-12-23 DIAGNOSIS — Z598 Other problems related to housing and economic circumstances: Secondary | ICD-10-CM | POA: Diagnosis not present

## 2013-12-23 DIAGNOSIS — Z5989 Other problems related to housing and economic circumstances: Secondary | ICD-10-CM

## 2013-12-23 DIAGNOSIS — G40909 Epilepsy, unspecified, not intractable, without status epilepticus: Secondary | ICD-10-CM | POA: Diagnosis present

## 2013-12-23 DIAGNOSIS — G4700 Insomnia, unspecified: Secondary | ICD-10-CM | POA: Diagnosis present

## 2013-12-23 DIAGNOSIS — E569 Vitamin deficiency, unspecified: Secondary | ICD-10-CM | POA: Diagnosis present

## 2013-12-23 DIAGNOSIS — Z8659 Personal history of other mental and behavioral disorders: Secondary | ICD-10-CM

## 2013-12-23 DIAGNOSIS — Z8249 Family history of ischemic heart disease and other diseases of the circulatory system: Secondary | ICD-10-CM | POA: Diagnosis not present

## 2013-12-23 DIAGNOSIS — F411 Generalized anxiety disorder: Secondary | ICD-10-CM | POA: Diagnosis present

## 2013-12-23 DIAGNOSIS — F3162 Bipolar disorder, current episode mixed, moderate: Secondary | ICD-10-CM | POA: Diagnosis present

## 2013-12-23 DIAGNOSIS — Z8669 Personal history of other diseases of the nervous system and sense organs: Secondary | ICD-10-CM

## 2013-12-23 DIAGNOSIS — Z5987 Material hardship due to limited financial resources, not elsewhere classified: Secondary | ICD-10-CM

## 2013-12-23 DIAGNOSIS — F1410 Cocaine abuse, uncomplicated: Secondary | ICD-10-CM

## 2013-12-23 DIAGNOSIS — F39 Unspecified mood [affective] disorder: Secondary | ICD-10-CM | POA: Diagnosis present

## 2013-12-23 DIAGNOSIS — Z833 Family history of diabetes mellitus: Secondary | ICD-10-CM | POA: Diagnosis not present

## 2013-12-23 DIAGNOSIS — F329 Major depressive disorder, single episode, unspecified: Secondary | ICD-10-CM | POA: Diagnosis present

## 2013-12-23 DIAGNOSIS — R45851 Suicidal ideations: Secondary | ICD-10-CM

## 2013-12-23 DIAGNOSIS — F3289 Other specified depressive episodes: Secondary | ICD-10-CM | POA: Diagnosis not present

## 2013-12-23 DIAGNOSIS — F4323 Adjustment disorder with mixed anxiety and depressed mood: Secondary | ICD-10-CM

## 2013-12-23 DIAGNOSIS — F1210 Cannabis abuse, uncomplicated: Secondary | ICD-10-CM

## 2013-12-23 HISTORY — DX: Major depressive disorder, single episode, unspecified: F32.9

## 2013-12-23 HISTORY — DX: Bipolar disorder, unspecified: F31.9

## 2013-12-23 HISTORY — DX: Depression, unspecified: F32.A

## 2013-12-23 MED ORDER — PANTOPRAZOLE SODIUM 40 MG PO TBEC
40.0000 mg | DELAYED_RELEASE_TABLET | Freq: Every day | ORAL | Status: DC
  Administered 2013-12-23: 40 mg via ORAL
  Filled 2013-12-23 (×4): qty 1

## 2013-12-23 MED ORDER — ACETAMINOPHEN 325 MG PO TABS
650.0000 mg | ORAL_TABLET | Freq: Four times a day (QID) | ORAL | Status: DC | PRN
  Administered 2013-12-25 – 2013-12-26 (×2): 650 mg via ORAL
  Filled 2013-12-23 (×2): qty 2

## 2013-12-23 MED ORDER — ALUM & MAG HYDROXIDE-SIMETH 200-200-20 MG/5ML PO SUSP
30.0000 mL | ORAL | Status: DC | PRN
  Administered 2013-12-23: 30 mL via ORAL

## 2013-12-23 MED ORDER — RISPERIDONE 1 MG PO TABS: 1.0000 mg | ORAL_TABLET | Freq: Every day | ORAL | Status: DC

## 2013-12-23 MED ORDER — ALUM & MAG HYDROXIDE-SIMETH 200-200-20 MG/5ML PO SUSP
30.0000 mL | ORAL | Status: DC | PRN
Start: 2013-12-23 — End: 2013-12-28

## 2013-12-23 MED ORDER — TRAZODONE HCL 50 MG PO TABS
50.0000 mg | ORAL_TABLET | Freq: Every evening | ORAL | Status: DC | PRN
  Administered 2013-12-23 – 2013-12-24 (×2): 50 mg via ORAL

## 2013-12-23 MED ORDER — DIVALPROEX SODIUM ER 250 MG PO TB24: 250.0000 mg | ORAL_TABLET | Freq: Every day | ORAL | Status: DC

## 2013-12-23 MED ORDER — TRAZODONE HCL 50 MG PO TABS: 50.0000 mg | ORAL_TABLET | Freq: Every evening | ORAL | Status: DC | PRN

## 2013-12-23 MED ORDER — MAGNESIUM HYDROXIDE 400 MG/5ML PO SUSP: 30.0000 mL | Freq: Every day | ORAL | Status: DC | PRN

## 2013-12-23 NOTE — Discharge Instructions (Signed)
For your ongoing behavioral health needs, you have an appointment to be seen by the [facility] Team on Tuesday, 12/27/2013 at 09:00.  They will call you before the appointment date, and if necessary, will provide transportation.  If you do not hear from them, you may contact them at the phone number below:       [facility]      [address], [place] [address]      [phone]      This phone number also serves as a crisis number, available 24 hours a day, 7 days a week.  If you have a behavioral health crisis you have several other options, all of which are available 24 hours a day, 7 days a week:  *Call Mobile Crisis and a clinician will come to you: [phone] *Call 911 *Go to the [facility] at [place]. [place], [place] *Go to your local hospital emergency department

## 2013-12-23 NOTE — Progress Notes (Deleted)
Patient ID: Melissa Dougherty, female   DOB: 20-Sep-1970, 43 y.o.   MRN: [id number] S-Rounding in Obs. Pt reports she is feeling better though she still fears she will harm her daughter if she continues to live there.She reports she "only" had a 1-2 day relapse on cocaine/THC but she reports a significant weekly alcohol intake of 15 beers.She denies she neds treatment at this time. She states the solution to her problem is finding a place to live.Hasnt had much sleep due to fear of sleeping in daughters home-denies cocaine sleep deprivation O-Vital signs reveiwed and stable      MSE- Behavior: WNL;Alert/ORientation:Oriented/Affect:Appropriate Perception:Intact/Thought:Comprehensible      Labs:^ glucose 130s No UDS A- Axis I    Cocaine THC abuse/?ETOH abuse/dependence;Hx Mood disorder (MDD)/SIMD      Axis II   Deferred       Axis III  GERD/Hx of ETOH related seizure DO (last seizure 1990s)       Axis IV  Problems with primary support group       Axis V   GAF 50-60 P-  Find safe haven for pt to go to .  ADDENDUM: Nurses report they received information pt has a POA .Will ask counselor to FU on this
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
## 2013-12-23 NOTE — Discharge Summary (Signed)
DISCHARGE SUMMARY (SRA)  DATE OF ADMISSION: JULY 30,2015 DATE OF DISCHARGE: [place] COURSE: [facility] IN [facility] DIST[facility] AFTER BEING TAKEN OFF LONG TERM PSYCHIATRIC MEDICATIONS.SHE WAS ADMITTED TO OBS UNIT AND HER MEDICATIONS RESTARTED.SHE REPORTED REMARKABLE IMPROVEMENT IN HER MOOD AND OUTLOOK AND WAS D/C TO HOME WITH FOLLOWUP AT [facility] AND PRESCRIPTIONS FOR HER MEDICATIONS.   ROS: NO CHANGE FROM ED ROS EXCEPT HER FATIGUE IS GONE AND HER MOOSD IS EUTHYMIC  PHYSICAL EXAM: HEENT;NECK;LUNGS;HEART;ABDOMEN;NEURO WNL ;MSE BELOW   Past Medical History  Diagnosis Date  . SI (sacroiliac) joint inflammation   . GERD (gastroesophageal reflux disease)   . Depression   . Hyperlipidemia   . Non-Hodgkin lymphoma   . Myocardial infarction 43 years old  . Coronary artery disease   . Chronic back pain   . Chronic pain   . Osteomyelitis   . 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 rate 16, height 5' 1.5" (1.562 m), weight 61.689 kg (136 lb), last menstrual period 11/23/2013.Body mass index is 25.28 kg/(m^2).   General Appearance: Fairly Groomed   Engineer, water::  Good   Speech:  Clear and Coherent   Volume:  Normal   Mood:  Euthymic   Affect:  Full Range   Thought Process:  Intact and Logical   Orientation:  Full (Time, Place, and Person)   Thought Content:  WDL   Suicidal Thoughts:  No   Homicidal Thoughts:  No   Memory:  Negative   Judgement:  Fair   Insight:  Fair   Psychomotor Activity:  Normal   Concentration:  Good   Recall:  Good   Fund of Knowledge:Fair   Language: Fair   Akathisia:   NA   Handed:  Right   AIMS (if indicated):  NA   Assets:  Financial Resources/Insurance  Social Support   Sleep:       Musculoskeletal: Strength & Muscle Tone: within normal limits Gait & Station: normal Patient leans: N/A   Mental Status Per Nursing Assessment::    On Admission:  SEE PROGRESS NOTE FROM ADMITTING NURSE  Current Mental Status by Physician: NA and see SPECIALTY EXAM-PT HAS NO SUICIDAL THOUGHTS  Loss Factors: mEDICATIONS DISCONTINUED WITHOUT EXPLANATION PER PT  Historical Factors: Impulsivity and NA  Risk Reduction Factors:    Employed and Positive social support  Continued Clinical Symptoms:   Alcohol/Substance Abuse/Dependencies Previous Psychiatric Diagnoses and Treatments  Cognitive Features That Contribute To Risk:   Polarized thinking    Suicide Risk:   Minimal: No identifiable suicidal ideation.  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  Seizures     .  Alcohol abuse      AXIS IV:  educational problems, problems related to social environment and problems with primary support group AXIS V:  51-60 moderate symptoms  Plan Of Care/Follow-up  recommendations:   Activity:  AS TOLERATED Diet:  HEART HEALTHY/COMPATIBLE WITH HER DYSPHAGIA  Is patient on multiple antipsychotic therapies at discharge:  No   Has Patient had three or more failed trials of antipsychotic monotherapy by history:  No  Recommended Plan for Multiple Antipsychotic Therapies: NA  DISCHARGE PLANS: PT MET WITH COUNSELOR PRIOR TO D/C SHE WILL HAVE FU WITH  [facility] MONDAY TO REVIEW HER SITUATION.sHE WAS GIVEN RX FOR rISPERDAL 1MG dEPAKOTE 250 MG AND TRAZODONE 50 MG PENDING HER FU.REQUEST FOR STD TESTING- REFERRED HER TO HER PCP.SHE QWAS AFFORDED A BUS PAS TO RETURN HOME. THE COUNSELOR ALSO CONTACTED THE POA.    Darlyne Russian E  PAC

## 2013-12-23 NOTE — Progress Notes (Signed)
Patient ID: Melissa Dougherty, female   DOB: 11/16/1970, 43 y.o.   MRN: [id number] Clarification on her diet she stated on admission that she had esophageal surgery as a chid and the only thing she couldn't eat were hotdogs, or anything hard. No other food restrictions were voiced. She said she had lost weight in the last 9 months, possibly 40 lb but has had a good appetite since being in OBS.

## 2013-12-23 NOTE — Plan of Care (Signed)
 Observation Crisis Plan  Reason for Crisis Plan:  Medication Management   Plan of Care:  Refer to [facility] Transitional Care Team   Family Support:    Melissa Dougherty and Melissa Dougherty (foster parents); Melissa Dougherty (foster sister/POA/Payee; [phone])  Current Living Environment:  Living Arrangements: Alone  Insurance:  [facility] Account   Name Acct ID Class Status Primary Coverage   Melissa, Dougherty [id number] [facility]        Guarantor Account (for Hospital Account [id number])   Name Relation to Pt Service Area Active? Acct Type   Melissa Dougherty Self [facility] Yes Behavioral Health   Address Phone       Melissa Dougherty  [place], [place] [address] [phone])          Coverage Information (for Hospital Account [id number])   F/O Payor/Plan Precert #   MEDICAID [place]/MEDICAID [place] ACCESS    Subscriber Subscriber #   Melissa Dougherty, Melissa Dougherty [id number] P   Address Phone   [address] [place], [place] [address] [phone]      Legal Guardian:   Self  Primary Care Provider:  [name], MD  Current Outpatient Providers:  Melissa Dougherty  Psychiatrist:   Beverly Dougherty  Counselor/Therapist:   Beverly Dougherty  Compliant with Medications:  Yes; pt's psychotropics were reportedly recently discontinued.  Additional Information: After consulting with Melissa Russian, PA it has been determined that pt does not present a life threatening danger to herself or others at this time, and that psychiatric hospitalization is not indicated for her.  Observation Unit nursing staff reported to me that pt's POA, Melissa Dougherty, had heard about this decision and had concerns.  Pt had already signed Consent to Release Information to her, so I called Melissa Dougherty and spoke to her.  She is concerned that pt has not had enough to time get restarted on medications, as well as pt's recent erratic behavior.  She asks me to speak to pt's recent  therapist at [employer], Melissa Dougherty.  Pt signed Consent to Release information to him.  Melissa Dougherty reports that pt was recently a CD-IOP client, and she had thrived in that level of care.  He became concerned when she was discharged from IOP to routine treatment.  He believes that pt would benefit from enhanced treatment services.  Neither Melissa Dougherty nor Melissa Dougherty report recent SI, HI, or AH/VH on the part of the pt.  This information was staffed with Melissa Dougherty, who maintains that pt is safe for discharge.  Pt then spoke to Melissa Dougherty with the [facility].  After interviewing the pt, she has arranged for pt to be seen at [facility] on Tuesday, 12/27/2013 at 09:00; they will be in touch with the pt in advance of this appointment, and will provide transportation if necessary.  This information will be included in pt's discharge plan.   Melissa Dougherty 7/31/201512:25 PM

## 2013-12-23 NOTE — Progress Notes (Signed)
Patient ID: Melissa Dougherty, female   DOB: 07-Nov-1970, 43 y.o.   MRN: [id number] S Pt seen on rounds.Reports she is feeling much better.Her medications were reinstituted after being stopped abruptly by outside provider she has left.[facility] has [facility] but her meds were not reinstituted.She is ready to go home.  O- Vitals signs reviewed       Labs reviewed UDS negative       MSE Behavoir:WNL Alert                Orientation:Fully oriented                 Affect:Appropriate                 Perception:Intact                 Thought:Comprehensible       Meds reviewed  A- Much improved with Observation and Medication P-D/C to home today for FU with [facility].

## 2013-12-23 NOTE — [facility] Suicide Risk Assessment (Cosign Needed)
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 rate 16, height 5' 1.5" (1.562 m), weight 61.689 kg (136 lb), last menstrual period 11/23/2013.Body mass index is 25.28 kg/(m^2).  General Appearance: Fairly Groomed  Engineer, water::  Good  Speech:  Clear and Coherent  Volume:  Normal  Mood:  Euthymic  Affect:  Full Range  Thought Process:  Intact and Logical  Orientation:  Full (Time, Place, and Person)  Thought Content:  WDL  Suicidal Thoughts:  No  Homicidal Thoughts:  No  Memory:  Negative  Judgement:  Fair  Insight:  Fair  Psychomotor Activity:  Normal  Concentration:  Good  Recall:  Good  Fund of Knowledge:Fair  Language: Fair  Akathisia:  NA  Handed:  Right  AIMS (if indicated):  NA  Assets:  Financial Resources/Insurance Social Support  Sleep:       Musculoskeletal: Strength & Muscle Tone: within normal limits Gait & Station: normal Patient leans: N/A   Mental Status Per Nursing Assessment::   On Admission:  SEE PROGRESS NOTE FROM ADMITTING NURSE  Current Mental Status by Physician: NA and see SPECIALTY EXAM-PT HAS NO SUICIDAL THOUGHTS  Loss Factors: mEDICATIONS DISCONTINUED WITHOUT EXPLANATION PER PT  Historical Factors: Impulsivity and NA  Risk Reduction Factors:   Employed and Positive social support  Continued Clinical Symptoms:  Alcohol/Substance Abuse/Dependencies Previous Psychiatric Diagnoses and Treatments  Cognitive Features That Contribute To Risk:  Polarized thinking    Suicide Risk:  Minimal: No identifiable suicidal ideation.  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 Seizures   . Alcohol abuse    AXIS IV:  educational problems, problems related to social environment and problems with primary support group AXIS V:  51-60 moderate symptoms  Plan Of Care/Follow-up recommendations:  Activity:  AS TOLERATED Diet:  HEART HEALTHY/COMPATIBLE WITH HER DYSPHAGIA  Is patient on multiple antipsychotic therapies at discharge:  No   Has Patient had three or more failed trials of antipsychotic monotherapy by history:  No  Recommended Plan for Multiple Antipsychotic Therapies: NA    Jakylah Bassinger E 12/23/2013, 2:21 PM

## 2013-12-23 NOTE — Progress Notes (Signed)
Pt awake and shouting out across room about ways to write a business letter. Pt told it is 4 am and we must keep our voice down due to other patient is sleeping. Pt became irriated and upset. Difficult to redirect. Up and down taking showers. Will continue to monitor closely.
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
## 2013-12-23 NOTE — Progress Notes (Signed)
Patient is A&Ox4, negative for SI/HI, negative for A/V hallucinations. Patient is appropriate and cooperative. Patient verbalizes understanding of discharge instructions and follow up. Patient has a bus pass and her prescriptions, verbalizes understanding of each. Patient states she feels better and safe. Patient is being discharged home alone.  Melissa Dougherty, Thornton Dales

## 2013-12-24 ENCOUNTER — Encounter (HOSPITAL_COMMUNITY): Payer: Self-pay | Admitting: Psychiatry

## 2013-12-24 DIAGNOSIS — F39 Unspecified mood [affective] disorder: Secondary | ICD-10-CM | POA: Diagnosis present

## 2013-12-24 DIAGNOSIS — F3162 Bipolar disorder, current episode mixed, moderate: Principal | ICD-10-CM | POA: Diagnosis present

## 2013-12-24 DIAGNOSIS — F3160 Bipolar disorder, current episode mixed, unspecified: Secondary | ICD-10-CM

## 2013-12-24 MED ORDER — OLANZAPINE 10 MG PO TBDP
10.0000 mg | ORAL_TABLET | Freq: Three times a day (TID) | ORAL | Status: DC | PRN
  Administered 2013-12-24 – 2013-12-25 (×3): 10 mg via ORAL
  Filled 2013-12-24 (×3): qty 1

## 2013-12-24 MED ORDER — PANTOPRAZOLE SODIUM 40 MG PO TBEC
40.0000 mg | DELAYED_RELEASE_TABLET | Freq: Every day | ORAL | Status: DC
  Administered 2013-12-24 – 2013-12-28 (×5): 40 mg via ORAL
  Filled 2013-12-24 (×6): qty 1

## 2013-12-24 MED ORDER — DIVALPROEX SODIUM 500 MG PO DR TAB
500.0000 mg | DELAYED_RELEASE_TABLET | Freq: Two times a day (BID) | ORAL | Status: DC
  Administered 2013-12-24 – 2013-12-28 (×8): 500 mg via ORAL
  Filled 2013-12-24 (×11): qty 1

## 2013-12-24 MED ORDER — CALCIUM CARBONATE ANTACID 500 MG PO CHEW
400.0000 mg | CHEWABLE_TABLET | Freq: Two times a day (BID) | ORAL | Status: DC | PRN
  Filled 2013-12-24: qty 2

## 2013-12-24 MED ORDER — ONDANSETRON HCL 4 MG PO TABS
4.0000 mg | ORAL_TABLET | Freq: Three times a day (TID) | ORAL | Status: DC | PRN
  Administered 2013-12-24: 4 mg via ORAL
  Filled 2013-12-24: qty 1

## 2013-12-24 MED ORDER — ARIPIPRAZOLE 5 MG PO TABS
5.0000 mg | ORAL_TABLET | Freq: Every day | ORAL | Status: DC
  Administered 2013-12-24 – 2013-12-28 (×5): 5 mg via ORAL
  Filled 2013-12-24 (×6): qty 1

## 2013-12-24 MED ORDER — TRAZODONE HCL 100 MG PO TABS
100.0000 mg | ORAL_TABLET | Freq: Every evening | ORAL | Status: DC | PRN
  Administered 2013-12-24 – 2013-12-28 (×6): 100 mg via ORAL
  Filled 2013-12-24 (×5): qty 1
  Filled 2013-12-24: qty 6

## 2013-12-24 NOTE — Plan of Care (Signed)
Problem: Alteration in mood; excessive anxiety as evidenced by: Goal: STG-Pt can identify coping skills to manage panic/anxiety (Patient can identify at least ____ coping skills to manage panic/anxiety attack)  Outcome: Not Progressing Patient had outburst in group tonight and had to leave. Was encouraged to utilize coping skills however had difficulty doing so.  Problem: Aggression Towards others,Towards Self, and or Destruction Goal: STG-Patient will comply with prescribed medication regimen (Patient will comply with prescribed medication regimen)  Outcome: Progressing Patient has been med compliant and is able to identify when medications are needed to aid in her mood/agitation.

## 2013-12-24 NOTE — Progress Notes (Signed)
Psychoeducational Group Note  Date: 12/24/2013 Time:  1015  Group Topic/Focus:  Identifying Needs:   The focus of this group is to help patients identify their personal needs that have been historically problematic and identify healthy behaviors to address their needs.  Participation Level:  Active  Participation Quality:  Attentive  Affect:  Appropriate  Cognitive:  Oriented  Insight:  Improving  Engagement in Group:  Engaged  Additional Comments:  Attended the group and partisapated  Paulino Rily

## 2013-12-24 NOTE — [facility] Suicide Risk Assessment (Signed)
   Nursing information obtained from:    Demographic factors:    Current Mental Status:    Loss Factors:    Historical Factors:    Risk Reduction Factors:    Total Time spent with patient: 30 minutes  CLINICAL FACTORS:   Severe Anxiety and/or Agitation Bipolar Disorder:   Mixed State Depression:   Aggression Hopelessness Impulsivity Insomnia  Psychiatric Specialty Exam: Physical Exam  Psychiatric: Thought content normal. Her affect is labile. Her speech is rapid and/or pressured. She is agitated and aggressive. Cognition and memory are normal. She expresses impulsivity. She exhibits a depressed mood.    Review of Systems  HENT: Negative.   Eyes: Negative.   Respiratory: Negative.   Cardiovascular: Negative.   Gastrointestinal: Negative.   Genitourinary: Negative.   Musculoskeletal: Negative.   Skin: Negative.   Neurological: Positive for seizures.  Endo/Heme/Allergies: Negative.   Psychiatric/Behavioral: Positive for depression. The patient is nervous/anxious and has insomnia.     Blood pressure 105/72, pulse 89, temperature 97.7 F (36.5 C), temperature source Oral, resp. rate 18, height 5\' 3"  (1.6 m), weight 63.504 kg (140 lb), last menstrual period 11/23/2013.Body mass index is 24.81 kg/(m^2).  General Appearance: Disheveled  Eye Sport and exercise psychologist::  Fair  Speech:  Pressured  Volume:  Increased  Mood:  Irritable  Affect:  Full Range  Thought Process:  Disorganized  Orientation:  Full (Time, Place, and Person)  Thought Content:  Delusions  Suicidal Thoughts:  No  Homicidal Thoughts:  No  Memory:  Immediate;   Fair Recent;   Fair Remote;   Fair  Judgement:  Impaired  Insight:  Lacking  Psychomotor Activity:  Increased  Concentration:  Fair  Recall:  [employer] of Knowledge:Fair  Language: Good  Akathisia:  No  Handed:  Right  AIMS (if indicated):     Assets:  Communication Skills Desire for Improvement Physical Health  Sleep:  Number of Hours: 1.25    Musculoskeletal: Strength & Muscle Tone: within normal limits Gait & Station: normal Patient leans: N/A  COGNITIVE FEATURES THAT CONTRIBUTE TO RISK:  Closed-mindedness Polarized thinking    SUICIDE RISK:   Minimal: No identifiable suicidal ideation.  Patients presenting with no risk factors but with morbid ruminations; may be classified as minimal risk based on the severity of the depressive symptoms  PLAN OF CARE:1. Admit for crisis management and stabilization. 2. Medication management to reduce current symptoms to base line and improve the     patient's overall level of functioning 3. Treat health problems as indicated. 4. Develop treatment plan to decrease risk of relapse upon discharge and the need for     readmission. 5. Psycho-social education regarding relapse prevention and self care. 6. Health care follow up as needed for medical problems. 7. Restart home medications where appropriate.   I certify that inpatient services furnished can reasonably be expected to improve the patient's condition.  Kyira Volkert,MD 12/24/2013, 10:51 AM

## 2013-12-24 NOTE — Tx Team (Signed)
Initial Interdisciplinary Treatment Plan   PATIENT STRESSORS: Medication change or noncompliance   PROBLEM LIST: Problem List/Patient Goals Date to be addressed Date deferred Reason deferred Estimated date of resolution  Medication non-compliance      Agitation      Risk for suicide                                           DISCHARGE CRITERIA:  Improved stabilization in mood, thinking, and/or behavior Verbal commitment to aftercare and medication compliance  PRELIMINARY DISCHARGE PLAN: Attend aftercare/continuing care group Outpatient therapy  PATIENT/FAMIILY INVOLVEMENT: This treatment plan has been presented to and reviewed with the patient, Melissa Dougherty.  The patient and family have been given the opportunity to ask questions and make suggestions.  Vonzella Nipple A 12/24/2013, 12:06 AM

## 2013-12-24 NOTE — [facility] Group Notes (Signed)
[facility] Group Notes:  (Clinical Social Work)  12/24/2013   1:15-2:15PM  Summary of Progress/Problems:   The main focus of today's process group was for the patient to identify ways in which they have sabotaged their own mental health wellness/recovery.  Motivational interviewing and a handout were used to explore the benefits and costs of their self-sabotaging behavior as well as the benefits and costs of changing this behavior.  The Stages of Change were explained to the group using a handout, and patients identified where they are with regard to changing self-defeating behaviors.  The patient was not present for the beginning of group, and only joined when first part of discussion about benefits/costs was concluding.  She raised her hand to speak and then presented a lengthy description of her hospitalization which was quite angry and volatile.  She does not believe she needs to be in the hospital.  She complained about her mental health providers, then immediately praised them.  Her voice was very strident and shrill, causing another patient to move to the other side of the room.  When CSW redirected her several times, she became irritated and left the room stating, "It's obvious group isn't going to help me like it is supposed to."  Type of Therapy:  Process Group  Participation Level:  Active  Participation Quality:  Monopolizing and Resistant  Affect:  Labile  Cognitive:  Disorganized  Insight:  None  Engagement in Therapy:  Limited  Modes of Intervention:  Education, Motivational Interviewing   Selmer Dominion, LCSW 12/24/2013, 4:00pm

## 2013-12-24 NOTE — Progress Notes (Signed)
Patient has remained labile this evening as she did much of the day. Became angry in group and left. Was able to return. Upset that she's had poor sleep and was encouraged by staff to notify nurse should that occur again tonight. This writer medicated patient with trazadone, reiterated repeat dose is available until 0200 and encouraged her to notify staff if it is needed. Explained to patient correlation between adequate restful sleep and mood lability. Pt verbalized understanding. Pt also complained of nausea and was medicated with zofran. Will reassess in 1 hour. She has not been observed making any further phone calls tonight. She denies SI/HI/AVH and remains safe. Jamie Kato

## 2013-12-24 NOTE — Progress Notes (Signed)
Melissa Dougherty has had a hard time all day...regulating her behavior related to her labile emotions. She has a lot to say, is easily distractible and frequently gets " off track" when she is answering a question, ie she ends up tellinga long drawn out story that frequently does not pertain to the question asked.    A She completed her morning assessment and on it she writes she denies SI within the past 24 hrs, she rated her depression, hopelessness and anxiety "09/27/08" and she says her dc plans   Are to " address my issues".   R Safety is in place and poc cont.

## 2013-12-24 NOTE — Progress Notes (Signed)
Patient ID: Melissa Dougherty, female   DOB: [birth year], 43 y.o.   MRN: [id number]  Admission Note:  D: Pt back at [facility] after being D/C'd from obs unit this afternoon. Pt stated the place where she gets her medications  Won't be able to fill her prescription until  12/30/13 due to her medicaid.   Pt  denies SI /HI/ AVH at this time.Pt agitated and irritated, but redirectable. Pt stated she said it was safer for her to come back to [facility] than be out there without her medications. Pt stated where she goes [employer]) has "messes up her medications", so she wants to go to [facility].   A: Pt admitted to unit per protocol, skin assessment and search done.  Pt  educated on therapeutic milieu rules. Pt was introduced to milieu by nursing staff.    R: Pt was receptive to education about the milieu .  15 min safety checks started. Probation officer offered support
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
## 2013-12-24 NOTE — Progress Notes (Signed)
 .  Psychoeducational Group Note    Date: 12/24/2013 Time: 0930  Goal Setting Purpose of Group: To be able to set a goal that is measurable and that can be accomplished in one day Participation Level:  Active  Participation Quality:  Appropriate  Affect:  Appropriate  Cognitive:  Oriented  Insight:  Improving  Engagement in Group:  Engaged  Additional Comments:  Pt participated and was responsive in the group. Was able to set a goal.  Paulino Rily

## 2013-12-24 NOTE — Progress Notes (Signed)
Adult Psychoeducational Group Note  Date:  12/24/2013 Time:  10:33 PM  Group Topic/Focus:  Wrap-Up Group:   The focus of this group is to help patients review their daily goal of treatment and discuss progress on daily workbooks.  Participation Level:  Minimal  Participation Quality:  Resistant  Affect:  Labile  Cognitive:  Alert  Insight: Lacking  Engagement in Group:  Resistant  Modes of Intervention:  Discussion  Additional Comments:  Pt was present for wrap up group. She was quiet until it was her turn to share. She became very upset and said that nothing good has happened today and she has no goals. She said that she is unable to sleep and claimed that staff here is ignoring her needs. This Probation officer tried to encourage the patient to communicate any difficulty sleeping tonight with night staff. The pt got more upset and left group. Following group, she apologized and thanked this Probation officer. She ate her snack and went to bed. She appeared to be asleep by 2130.    Harrie Foreman A 12/24/2013, 10:33 PM

## 2013-12-25 NOTE — H&P (Signed)
Psychiatric Admission Assessment Adult  Patient Identification:  Melissa Dougherty Date of Evaluation:  12/24/2013 Chief Complaint:  BIPOLAR DISORDER  Subjective: Pt seen and chart reviewed. Pt denies SI, HI, and AVH, contracts for safety. Pt reports that she is going to [place] on 8-26 for an appointment. Pt also reports that her medicaid kicks in on 12-30-13 and that she is unable to get her medications for the time being until this kicks in. Pt is very concerned about instability secondary to inability to take and obtain medications and came in due to this instability as well as intermittent auditory hallucinations.   History of Present Illness:: Melissa Dougherty is an 43 y.o. female who presents to [facility] accompanied by her sister, who did not participate in assessment. Pt was discharged from Observation Unit at [facility] seven hours ago (see notes from earlier today for additional clinical details). Per Lavell Luster, AC at [facility], Glenda Chroman, NP has been briefed on Pt's situation and wants Pt admitted to [facility]. Pt reports that she was discharged with prescriptions for Depakote and Risperdal but her pharmacy would not fill the prescription because she had had them refilled too recently and her insurance would not pay for another refill. Pt states she discovered today that a man in her neighborhood with whom she has been having a relationship for the past ten years has also been having a relationship with a married woman in the neighborhood for the past twenty years. Pt states the man "didn't really love me" and "was just using me." Pt is tearful and distraught. Pt says there was "a big drama" today. She states she feels unstable and unsafe with no medications to help her. She denies current suicidal ideation but admits to recent suicidal thoughts. She denies homicidal ideation or history of violence. Pt denies any auditory or visual hallucinations.  Pt is casually dressed, poorly groomed, alert,  oriented x4 with normal speech and normal motor behavior. Eye contact is good and Pt tearful during assessment. Pt's mood is depressed, anxious and irritable and affect is labile. Thought process is coherent and relevant. There is no indication Pt is currently responding to internal stimuli or experiencing delusional thought content. Pt's insight and judgment appears limited. She was generally cooperative during assessment and is requesting inpatient treatment.   Elements:  Location:  Generalized, Inpatient [facility]. Quality:  Worsening. Severity:  Severe. Timing:  Constant. Duration:  Chronic. Context:  Pt ran out of medications and has no medical coverage, complaining of AH at this time. . Associated Signs/Synptoms: Depression Symptoms:  depressed mood, anhedonia, insomnia, psychomotor agitation, feelings of worthlessness/guilt, difficulty concentrating, hopelessness, anxiety, insomnia, (Hypo) Manic Symptoms:  Impulsivity, Irritable Mood, Anxiety Symptoms:  Excessive Worry, Psychotic Symptoms:  Denies PTSD Symptoms: Denies Total Time spent with patient: 45 minutes  Psychiatric Specialty Exam: Physical Exam Full Physical Exam performed in ED; reviewed, stable, and I concur with this assessment.   Review of Systems  Constitutional: Negative.   HENT: Negative.   Eyes: Negative.   Respiratory: Negative.   Cardiovascular: Negative.   Gastrointestinal: Negative.   Genitourinary: Negative.   Musculoskeletal: Negative.   Skin: Negative.   Neurological: Negative.   Endo/Heme/Allergies: Negative.   Psychiatric/Behavioral: Positive for depression. The patient is nervous/anxious.     Blood pressure 96/61, pulse 60, temperature 98 F (36.7 C), temperature source Oral, resp. rate 16, height '5\' 3"'  (1.6 m), weight 63.504 kg (140 lb), last menstrual period 11/23/2013.Body mass index is 24.81 kg/(m^2).  General Appearance: Disheveled   Eye Sport and exercise psychologist:: Fair   Speech: Pressured   Volume:  Increased   Mood: Irritable   Affect: Full Range   Thought Process: Disorganized   Orientation: Full (Time, Place, and Person)   Thought Content: Delusions   Suicidal Thoughts: No   Homicidal Thoughts: No   Memory: Immediate; Fair  Recent; Fair  Remote; Fair   Judgement: Impaired   Insight: Lacking   Psychomotor Activity: Increased   Concentration: Fair   Recall: [employer] of Knowledge:Fair   Language: Good   Akathisia: No   Handed: Right   AIMS (if indicated):   Assets: Communication Skills  Desire for Improvement  Physical Health   Sleep: Number of Hours: 1.25    Musculoskeletal:  Strength & Muscle Tone: within normal limits  Gait & Station: normal  Patient leans: N/A   Past Psychiatric History: Diagnosis: MDD   Hospitalizations: Denies  Outpatient Care: Dr. Criss Rosales (PCP)  Substance Abuse Care: Denies  Self-Mutilation: Denies  Suicidal Attempts: Denies  Violent Behaviors: Denies   Past Medical History:   Past Medical History  Diagnosis Date  . Seizures   . Alcohol abuse   . Bipolar disorder   . Depression    None. Allergies:   Allergies  Allergen Reactions  . Risperdal [Risperidone]     Lactation   PTA Medications: Prescriptions prior to admission  Medication Sig Dispense Refill  . cholecalciferol (VITAMIN D) 1000 UNITS tablet Take 1,000 Units by mouth daily.      . divalproex (DEPAKOTE ER) 250 MG 24 hr tablet Take 1 tablet (250 mg total) by mouth at bedtime.  30 tablet  0  . risperiDONE (RISPERDAL) 1 MG tablet Take 1 tablet (1 mg total) by mouth at bedtime.  30 tablet  0  . traZODone (DESYREL) 50 MG tablet Take 1 tablet (50 mg total) by mouth at bedtime as needed for sleep.  30 tablet  0    Previous Psychotropic Medications:  Medication/Dose                 Substance Abuse History in the last 12 months:  Yes.   6 months sober, discharged from [facility] for 29 days and stayed sober per pt report  Consequences of Substance  Abuse: NA  Social History:  reports that she has never smoked. She does not have any smokeless tobacco history on file. She reports that she does not drink alcohol or use illicit drugs. Additional Social History: Pain Medications: not abusing Prescriptions: not abusing Over the Counter: not abusing History of alcohol / drug use?: Yes Longest period of sobriety (when/how long): Pt reports 6 months of sobriety Withdrawal Symptoms:  (Denies withdrawal) Name of Substance 1: Patient has a hx of alcohol, THC, and cocaine use                  Current Place of Residence:  [place] of Birth:  [place] Family Members: children, foster family Marital Status:  Single Children: 2 children  Sons:  Daughters: Relationships: single Education:  Working on [employer] per pt report Educational Problems/Performance: difficulty focusing Religious Beliefs/Practices: History of Abuse (Emotional/Phsycial/Sexual) Occupational Experiences; Emergency planning/management officer History: Denies Legal History: Denies Hobbies/Interests: family time  Family History:   Family History  Problem Relation Age of Onset  . Diabetes Mother   . Hypertension Mother     Results for orders placed during the hospital encounter of 12/22/13 (from the past 72 hour(s))  URINE RAPID DRUG  SCREEN (HOSP PERFORMED)     Status: None   Collection Time    12/22/13 11:26 AM      Result Value Ref Range   Opiates NONE DETECTED  NONE DETECTED   Cocaine NONE DETECTED  NONE DETECTED   Benzodiazepines NONE DETECTED  NONE DETECTED   Amphetamines NONE DETECTED  NONE DETECTED   Tetrahydrocannabinol NONE DETECTED  NONE DETECTED   Barbiturates NONE DETECTED  NONE DETECTED   Comment:            DRUG SCREEN FOR MEDICAL PURPOSES     ONLY.  IF CONFIRMATION IS NEEDED     FOR ANY PURPOSE, NOTIFY LAB     WITHIN 5 DAYS.                LOWEST DETECTABLE LIMITS     FOR URINE DRUG SCREEN     Drug Class       Cutoff (ng/mL)     Amphetamine      1000      Barbiturate      200     Benzodiazepine   814     Tricyclics       481     Opiates          300     Cocaine          300     THC              50  CBC WITH DIFFERENTIAL     Status: Abnormal   Collection Time    12/22/13 11:41 AM      Result Value Ref Range   WBC 6.3  4.0 - 10.5 K/uL   RBC 6.29 (*) 3.87 - 5.11 MIL/uL   Hemoglobin 14.2  12.0 - 15.0 g/dL   HCT 42.0  36.0 - 46.0 %   MCV 66.8 (*) 78.0 - 100.0 fL   MCH 22.6 (*) 26.0 - 34.0 pg   MCHC 33.8  30.0 - 36.0 g/dL   RDW 18.2 (*) 11.5 - 15.5 %   Platelets 183  150 - 400 K/uL   Comment: SPECIMEN CHECKED FOR CLOTS     REPEATED TO VERIFY   Neutrophils Relative % 40 (*) 43 - 77 %   Lymphocytes Relative 49 (*) 12 - 46 %   Monocytes Relative 7  3 - 12 %   Eosinophils Relative 3  0 - 5 %   Basophils Relative 1  0 - 1 %   Neutro Abs 2.5  1.7 - 7.7 K/uL   Lymphs Abs 3.1  0.7 - 4.0 K/uL   Monocytes Absolute 0.4  0.1 - 1.0 K/uL   Eosinophils Absolute 0.2  0.0 - 0.7 K/uL   Basophils Absolute 0.1  0.0 - 0.1 K/uL   Smear Review MORPHOLOGY UNREMARKABLE    COMPREHENSIVE METABOLIC PANEL     Status: Abnormal   Collection Time    12/22/13 11:41 AM      Result Value Ref Range   Sodium 134 (*) 137 - 147 mEq/L   Potassium 4.1  3.7 - 5.3 mEq/L   Chloride 98  96 - 112 mEq/L   CO2 20  19 - 32 mEq/L   Glucose, Bld 81  70 - 99 mg/dL   BUN 10  6 - 23 mg/dL   Creatinine, Ser 0.92  0.50 - 1.10 mg/dL   Calcium 9.4  8.4 - 10.5 mg/dL   Total Protein 7.9  6.0 - 8.3 g/dL  Albumin 3.7  3.5 - 5.2 g/dL   AST 28  0 - 37 U/L   ALT 18  0 - 35 U/L   Alkaline Phosphatase 72  39 - 117 U/L   Total Bilirubin 0.4  0.3 - 1.2 mg/dL   GFR calc non Af Amer 76 (*) >90 mL/min   GFR calc Af Amer 88 (*) >90 mL/min   Comment: (NOTE)     The eGFR has been calculated using the CKD EPI equation.     This calculation has not been validated in all clinical situations.     eGFR's persistently <90 mL/min signify possible Chronic Kidney     Disease.   Anion gap 16 (*) 5 -  15  ETHANOL     Status: None   Collection Time    12/22/13 11:41 AM      Result Value Ref Range   Alcohol, Ethyl (B) <11  0 - 11 mg/dL   Comment:            LOWEST DETECTABLE LIMIT FOR     SERUM ALCOHOL IS 11 mg/dL     FOR MEDICAL PURPOSES ONLY  VALPROIC ACID LEVEL     Status: Abnormal   Collection Time    12/22/13 11:41 AM      Result Value Ref Range   Valproic Acid Lvl 14.3 (*) 50.0 - 100.0 ug/mL   Comment: Performed at [facility]   Psychological Evaluations:  Assessment:   DSM5:  Depressive Disorders:  Major Depressive Disorder - Severe (296.23)  AXIS I:  Bipolar, mixed AXIS II:  Deferred AXIS III:   Past Medical History  Diagnosis Date  . Seizures   . Alcohol abuse   . Bipolar disorder   . Depression    AXIS IV:  other psychosocial or environmental problems and problems related to social environment AXIS V:  41-50 serious symptoms  Treatment Plan/Recommendations:   Review of chart, vital signs, medications, and notes.  1-Individual and group therapy  2-Medication management for depression and anxiety: Medications reviewed with the patient and she stated no untoward effects, unchanged. 3-Coping skills for depression, anxiety  4-Continue crisis stabilization and management  5-Address health issues--monitoring vital signs, stable  6-Treatment plan in progress to prevent relapse of depression and anxiety  Treatment Plan Summary: Daily contact with patient to assess and evaluate symptoms and progress in treatment Medication management Current Medications:  Current Facility-Administered Medications  Medication Dose Route Frequency Provider Last Rate Last Dose  . acetaminophen (TYLENOL) tablet 650 mg  650 mg Oral Q6H PRN Malena Peer, NP   650 mg at 12/25/13 0132  . alum & mag hydroxide-simeth (MAALOX/MYLANTA) 200-200-20 MG/5ML suspension 30 mL  30 mL Oral Q4H PRN Evanna Glenda Chroman, NP      . ARIPiprazole (ABILIFY) tablet 5 mg  5 mg Oral Daily  Marquese Burkland   5 mg at 12/25/13 0755  . calcium carbonate (TUMS - dosed in mg elemental calcium) chewable tablet 400 mg of elemental calcium  400 mg of elemental calcium Oral BID PRN Benjamine Mola, FNP      . divalproex (DEPAKOTE) DR tablet 500 mg  500 mg Oral BID PC Neeka Urista   500 mg at 12/24/13 1400  . magnesium hydroxide (MILK OF MAGNESIA) suspension 30 mL  30 mL Oral Daily PRN Evanna Glenda Chroman, NP      . OLANZapine zydis (ZYPREXA) disintegrating tablet 10 mg  10 mg Oral Q8H PRN Naavya Postma  10 mg at 12/25/13 0019  . ondansetron (ZOFRAN) tablet 4 mg  4 mg Oral Q8H PRN Benjamine Mola, FNP   4 mg at 12/24/13 2138  . pantoprazole (PROTONIX) EC tablet 40 mg  40 mg Oral Daily Benjamine Mola, FNP   40 mg at 12/25/13 0755  . traZODone (DESYREL) tablet 100 mg  100 mg Oral QHS PRN,MR X 1 Kaelon Weekes   100 mg at 12/24/13 2307    Observation Level/Precautions:  15 minute checks  Laboratory:  Labs resulted, reviewed, and stable at this time.   Psychotherapy:  Group therapy, individual therapy, psychoeducation  Medications:  See MAR above  Consultations: None    Discharge Concerns: None    Estimated LOS: 5-7 days  Other:  N/A   I certify that inpatient services furnished can reasonably be expected to improve the patient's condition.   Benjamine Mola 12/24/2013  10:10 AM   Patient seen, evaluated and I agree with notes by Nurse Practitioner. Corena Pilgrim, MD

## 2013-12-25 NOTE — Progress Notes (Signed)
Patient has only slept 2.25 hours tonight even with 2 100mg  doses of trazadone as well as zydis 10mg . She has been up down, moving around her room, working on puzzles with the lights on. Numerous somatic complaints and prn medications administered. Encouraged patient to speak with provider today regarding meds at hs. Pt verbalized understanding. Jamie Kato

## 2013-12-25 NOTE — [facility] Group Notes (Signed)
[place] Group Notes:  (Clinical Social Work)  12/25/2013   1:15-2:15PM  Summary of Progress/Problems:  The main focus of today's process group was to   identify the patient's current support system and decide on other supports that can be put in place.  The picture on workbook was used to discuss why additional supports are needed.  An emphasis was placed on using counselor, doctor, therapy groups, 12-step groups, and problem-specific support groups to expand supports.   There was also an extensive discussion about what constitutes a healthy support versus an unhealthy support.  The patient expressed full comprehension of the concepts presented.  Current unhealthy supports are both her biological mother and her foster family, who show her no love or trust, and treat her as though she is a child who cannot make decisions for herself.  She also talked at length about her Actuary which repeatedly neglected to make note of her reactions to Risperdal.  She wants to terminate her services with them and go to another team.  She does believe she needs that level of support.  She was given the telephone number to [facility] to call and terminate services with them and will take to her CSW tomorrow about a new referral.  Type of Therapy:  Process Group  Participation Level:  Active  Participation Quality:  Attentive and Sharing  Affect:  Blunted  Cognitive:  Appropriate and Oriented  Insight:  Developing/Improving  Engagement in Therapy:  Engaged  Modes of Intervention:  Education,  Support and [employer], LCSW 12/25/2013, 4:00pm

## 2013-12-25 NOTE — Progress Notes (Signed)
Patient is up and visible in the milieu again this evening. Thus far patient has not had any outbursts or periods of lability. Her requests are less frequent and she is less somatic. Affect is flat though brightens slightly with engagement. Mood anxious but beginning to level out. Medicated with trazadone and zydis prn as her sleep was poor last night. Pt given support and praise for progress. Patient does admit to napping a considerable amount today and pt was reminded that in doing so, sleep will likely be poor tonight. She confirmed understanding. On reassess, pt is still awake. She denies SI/HI/AVH and remains safe. Jamie Kato

## 2013-12-25 NOTE — Progress Notes (Signed)
Nutrition Brief Note  Patient identified on the Malnutrition Screening Tool (MST) Report. Patient reports a 50 pound weight loss in the last few weeks, however, per chart review, weight relatively stable for the last year. Patient eating meals and snacks  Wt Readings from Last 15 Encounters:  12/23/13 140 lb (63.504 kg)  12/22/13 136 lb (61.689 kg)  12/14/13 144 lb (65.318 kg)  10/11/13 144 lb (65.318 kg)  10/01/12 147 lb (66.679 kg)    Body mass index is 24.81 kg/(m^2). Patient meets criteria for normal weight based on current BMI.   Current diet order is Regular, patient is consuming approximately 75-100% of meals at this time. Labs and medications reviewed.   No nutrition interventions warranted at this time. If nutrition issues arise, please consult RD.   Larey Seat, RD, LDN Pager #: [phone] After-Hours Pager #: [phone]

## 2013-12-25 NOTE — Progress Notes (Signed)
Psychoeducational Group Note  Psychoeducational Group Note  Date: 12/25/2013 Time:  0930  Group Topic/Focus:  Gratefulness:  The focus of this group is to help patients identify what two things they are most grateful for in their lives. What helps ground them and to center them on their work to their recovery.  Participation Level:  Active  Participation Quality:  Appropriate  Affect:  Appropriate  Cognitive:  Oriented  Insight:  Improving  Engagement in Group:  Engaged  Additional Comments:  Pt participated in the group  Bryson Dames A

## 2013-12-25 NOTE — [facility] Group Notes (Signed)
Adult Psychoeducational Group Note  Date:  12/25/2013 Time:  5:04 PM  Group Topic/Focus:  Self Care:   The focus of this group is to help patients understand the importance of self-care in order to improve or restore emotional, physical, spiritual, interpersonal, and financial health.  Participation Level:  Active  Participation Quality:  Appropriate  Affect:  Appropriate  Cognitive:  Alert  Insight: Appropriate  Engagement in Group:  Engaged  Modes of Intervention:  Discussion  Additional Comments:  Pt attended group. Pt stated she enjoyed going shopping. Pt stated her support system included her children and her foster family.  Jill Side, Amina Menchaca G 12/25/2013, 5:04 PM

## 2013-12-25 NOTE — Progress Notes (Signed)
Melissa Dougherty has been more well behaved today than she was yesterday. She has not been intrusive. Her manner and speech in group was more controlled and she has had no outbursts.   A She has taken her medications as scheduled. She completed her morning inventory and on it she wrote she denied SI within the past 24 hrs, she rated her depression and hopelessness and anxiety "5/7/9" and said  Her DC plan is to " take my medicines".   R Safety is in place and poc cont.

## 2013-12-25 NOTE — Plan of Care (Signed)
Problem: Ineffective individual coping Goal: STG: Patient will remain free from self harm Outcome: Progressing Patient is denying SI and has not engaged in any self harm.   Problem: Alteration in mood; excessive anxiety as evidenced by: Goal: STG-Patient can identify triggers for anxiety Outcome: Not Progressing Pt does not display insight into triggers for anxiety however patient's mood is starting to level out which may enable her to focus on stressors.

## 2013-12-25 NOTE — Progress Notes (Signed)
[place] Group Notes:  (Nursing/MHT/Case Management/Adjunct)  Date:  12/25/2013  Time:  8:54 PM  Type of Therapy:  Group Therapy  Participation Level:  Minimal  Participation Quality:  Drowsy  Affect:  Flat  Cognitive:  Alert  Insight:  Limited  Engagement in Group:  Limited  Modes of Intervention:  Discussion  Summary of Progress/Problems: Patient says that she had an awesome day today. States that she had a visit with her mother and it went very well. Patient says that she and her mother decided to start a new relationship by letting go of the past and starting over.  Donney Rankins 12/25/2013, 8:54 PM

## 2013-12-25 NOTE — Progress Notes (Signed)
Psychoeducational Group Note  Date:  12/25/2013 Time:  1015  Group Topic/Focus:  Making Healthy Choices:   The focus of this group is to help patients identify negative/unhealthy choices they were using prior to admission and identify positive/healthier coping strategies to replace them upon discharge.  Participation Level:  Active  Participation Quality:  Appropriate  Affect:  Flat  Cognitive:  Oriented  Insight:  Improving  Engagement in Group:  Engaged  Additional Comments:  Pt participated and was engaged with the discussion  Paulino Rily 12/25/2013

## 2013-12-26 MED ORDER — LORAZEPAM 1 MG PO TABS
1.0000 mg | ORAL_TABLET | Freq: Two times a day (BID) | ORAL | Status: DC
  Administered 2013-12-26 – 2013-12-28 (×4): 1 mg via ORAL
  Filled 2013-12-26 (×4): qty 1

## 2013-12-26 NOTE — Progress Notes (Signed)
Patient signed a 72 hour Release Request at 10:25 am  Tilden Fossa, MSW, [place] Worker [facility] [phone]

## 2013-12-26 NOTE — Progress Notes (Signed)
D: Patient in her room on appoach.  Patient states she had a good day but then changed her mind and states she had a bad day due to her sister stating she was calling her too much.  Patient states she has learned that she always needs to tell the truth no matter what.  Patient states her goal was to work on getting more sleep. Patient has been sleeping during the day also.  Patient denies SI/HI and denies AVH. A: Staff to monitor Q 15 mins for safety.  Encouragement and support offered.  Scheduled medications administered per orders. R: Patient remains safe on the unit.  Patient attended group tonight. Patient visible on the unit.  Patient taking administered medications.

## 2013-12-26 NOTE — [facility] Group Notes (Addendum)
[place] LCSW Group Therapy 12/26/2013  1:15 pm  Type of Therapy: Group Therapy Participation Level: Active  Participation Quality: Attentive, Sharing and Supportive  Affect: Depressed and Flat  Cognitive: Alert and Oriented  Insight: Developing/Improving and Engaged  Engagement in Therapy: Developing/Improving and Engaged  Modes of Intervention: Clarification, Confrontation, Discussion, Education, Exploration,  Limit-setting, Orientation, Problem-solving, Rapport Building, Art therapist, Socialization and Support  Summary of Progress/Problems: Pt identified obstacles faced currently and processed barriers involved in overcoming these obstacles. Pt identified steps necessary for overcoming these obstacles and explored motivation (internal and external) for facing these difficulties head on. Pt further identified one area of concern in their lives and chose a goal to focus on for today. Patient identified her primary obstacles as not sleeping and feeling lack of support from her family. Patient became tearful; CSW offered support and words of encouragement. Patient discussed getting her medications adjusted to where she will sleep better. Patient excused herself from group.   Tilden Fossa, MSW, [place] Worker [facility] [phone]

## 2013-12-26 NOTE — BHH Suicide Risk Assessment (Signed)
[place] INPATIENT:  Family/Significant Other Suicide Prevention Education  Suicide Prevention Education:  Education Completed, Cala Bradford (sister) [phone],  (name of family member/significant other) has been identified by the patient as the family member/significant other with whom the patient will be residing, and identified as the person(s) who will aid the patient in the event of a mental health crisis (suicidal ideations/suicide attempt).  With written consent from the patient, the family member/significant other has been provided the following suicide prevention education, prior to the and/or following the discharge of the patient.  The suicide prevention education provided includes the following:  Suicide risk factors  Suicide prevention and interventions  National Suicide Hotline telephone number  [facility] assessment telephone number  [facility] Emergency Assistance [place] and/or Residential Mobile Crisis Unit telephone number  Request made of family/significant other to:  Remove weapons (e.g., guns, rifles, knives), all items previously/currently identified as safety concern.    Remove drugs/medications (over-the-counter, prescriptions, illicit drugs), all items previously/currently identified as a safety concern.  The family member/significant other verbalizes understanding of the suicide prevention education information provided.  The family member/significant other agrees to remove the items of safety concern listed above.  Melissa Dougherty, Casimiro Needle 12/26/2013, 3:57 PM

## 2013-12-26 NOTE — Progress Notes (Signed)
Adult Psychoeducational Group Note  Date:  12/26/2013 Time:  8:50 PM  Group Topic/Focus:  Wrap-Up Group:   The focus of this group is to help patients review their daily goal of treatment and discuss progress on daily workbooks.  Participation Level:  Active  Participation Quality:  Appropriate  Affect:  Appropriate and Flat  Cognitive:  Appropriate  Insight: Appropriate  Engagement in Group:  Engaged  Modes of Intervention:  Support  Additional Comments:  Patient stated that if she was a kitchen appliance that she would be a toaster so that people could keep using her up. She stated that nothing positive happened today. Pt was provided support from [place], Vona Whiters 12/26/2013, 8:50 PM
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
## 2013-12-26 NOTE — BHH Group Notes (Signed)
[facility] LCSW Aftercare Discharge Planning Group Note   12/26/2013  1:04 PM   Participation Quality: Alert, Appropriate and Oriented  Mood/Affect: Depressed and Flat  Depression Rating: 0  Anxiety Rating: 0  Thoughts of Suicide: Pt denies SI/HI  Will you contract for safety? Yes  Current AVH: Pt denies  Plan for Discharge/Comments: Pt attended discharge planning group and actively participated in group. CSW provided pt with today's workbook. Patient reports that she is not sleeping. She lives in [place]. In her own apartment. Her guardian is her sister Cala Bradford [phone]. Patient has Medicaid and [employer]. She reports that she has recently started receiving services from [facility] but was seeing a provider at [facility] previously.   Transportation Means: Pt reports access to transportation- patient states that she can use the bus system.  Supports: Cala Bradford- sister  Tilden Fossa, MSW, [place] Worker [facility] [phone]

## 2013-12-26 NOTE — Progress Notes (Addendum)
[facility] MD Progress Note  12/26/2013 11:35 AM Melissa Dougherty  MRN:  [id number] Subjective:   Objective: 43 year old woman, who has  A long history of mental illness. States she was diagnosed with Bipolar Disorder as a child. Also has a history of Seizure Disorder, but has not had a seizure since 89.  She reports  She was feeling increasingly agitated, with worsening insomnia, and describes symptoms suggestive of a mixed episode, including agitation, insomnia, but depression and sadness as well. For many years she has been treated with Risperidone and with Depakote, which she stopped recently, because she felt they were not working properly. At this time she is on Depakote and Abilify - the latter is new and replaced Risperidone Denies any alcohol or drug abuse. At this time continues to report a subjective feeling of agitation, described mostly as " anxiety", and a subjective sense of needing to move /restlessness, although at this time no clear Threasa Heads is noted and appears comfortable in chair. On unit has started going to more groups, and has been more engaged, although still anxious and somatic   Diagnosis: Bipolar Disorder Mixed     Total Time spent with patient: 25 minutes     ADL's:  Fair   Sleep: poor   Appetite:  Fair   Suicidal Ideation:  Denies  Homicidal Ideation:  Denies  AEB (as evidenced by):  Psychiatric Specialty Exam: Physical Exam  Review of Systems  Constitutional: Negative for fever and chills.  Respiratory: Negative for shortness of breath.   Cardiovascular: Negative for chest pain and leg swelling.  Gastrointestinal: Positive for nausea. Negative for vomiting and abdominal pain.  Genitourinary: Negative for dysuria and urgency.  Skin: Negative for rash.  Psychiatric/Behavioral: Positive for depression. Negative for substance abuse.    Blood pressure 121/79, pulse 89, temperature 98.1 F (36.7 C), temperature source Oral, resp. rate 16, height 5\' 3"  (1.6  m), weight 63.504 kg (140 lb), last menstrual period 11/23/2013.Body mass index is 24.81 kg/(m^2).  General Appearance: Fairly Groomed  Engineer, water::  Good  Speech:  Normal Rate  Volume:  Decreased  Mood:  Anxious and Depressed  Affect:  Appropriate  Thought Process:  Goal Directed- denies racing thoughts and no looseness of associations or flight of ideations noted   Orientation:  NA- fully alert and attentive   Thought Content:  no hallucinations, no delusions  Suicidal Thoughts:  No- denies any SI or HI and contracts for safety on the unit   Homicidal Thoughts:  No  Memory:  NA  Judgement:  Fair  Insight:  Fair  Psychomotor Activity:  Normal- no evidence of akatisia at this time  Concentration:  Good  Recall:  Good  Fund of Knowledge:Good  Language: Good  Akathisia:  Negative  Handed:  Right  AIMS (if indicated):     Assets:  Communication Skills Desire for Improvement Resilience  Sleep:  Number of Hours: 2.25   Musculoskeletal: Strength & Muscle Tone: within normal limits Gait & Station: normal Patient leans: N/A  Current Medications: Current Facility-Administered Medications  Medication Dose Route Frequency Provider Last Rate Last Dose  . acetaminophen (TYLENOL) tablet 650 mg  650 mg Oral Q6H PRN Malena Peer, NP   650 mg at 12/26/13 0108  . alum & mag hydroxide-simeth (MAALOX/MYLANTA) 200-200-20 MG/5ML suspension 30 mL  30 mL Oral Q4H PRN Evanna Cori Burkett, NP      . ARIPiprazole (ABILIFY) tablet 5 mg  5 mg Oral Daily Mojeed Akintayo  5 mg at 12/26/13 0800  . calcium carbonate (TUMS - dosed in mg elemental calcium) chewable tablet 400 mg of elemental calcium  400 mg of elemental calcium Oral BID PRN Benjamine Mola, FNP      . divalproex (DEPAKOTE) DR tablet 500 mg  500 mg Oral BID PC Mojeed Akintayo   500 mg at 12/26/13 0812  . magnesium hydroxide (MILK OF MAGNESIA) suspension 30 mL  30 mL Oral Daily PRN Evanna Glenda Chroman, NP      . OLANZapine zydis  (ZYPREXA) disintegrating tablet 10 mg  10 mg Oral Q8H PRN Mojeed Akintayo   10 mg at 12/25/13 2115  . ondansetron (ZOFRAN) tablet 4 mg  4 mg Oral Q8H PRN Benjamine Mola, FNP   4 mg at 12/24/13 2138  . pantoprazole (PROTONIX) EC tablet 40 mg  40 mg Oral Daily Benjamine Mola, FNP   40 mg at 12/26/13 3428  . traZODone (DESYREL) tablet 100 mg  100 mg Oral QHS PRN,MR X 1 Mojeed Akintayo   100 mg at 12/25/13 2115    Lab Results: No results found for this or any previous visit (from the past 48 hour(s)).  Physical Findings: AIMS: Facial and Oral Movements Muscles of Facial Expression: None, normal Lips and Perioral Area: None, normal Jaw: None, normal Tongue: None, normal,Extremity Movements Upper (arms, wrists, hands, fingers): None, normal Lower (legs, knees, ankles, toes): None, normal, Trunk Movements Neck, shoulders, hips: None, normal, Overall Severity Severity of abnormal movements (highest score from questions above): None, normal Incapacitation due to abnormal movements: None, normal Patient's awareness of abnormal movements (rate only patient's report): No Awareness, Dental Status Current problems with teeth and/or dentures?: No Does patient usually wear dentures?: No  CIWA:  CIWA-Ar Total: 0 COWS:  COWS Total Score: 2  Assessment-  Patient has a history of Bipolar Disorder and is presenting with symptonms suggestive of a mixed episode. She has been on Depakote for many years and it appears that this medication is not solely for Bipolar but also an effective anti-seizure medication for her, as she has not had any grand mal seizures in many years on the Depakote. Therefore, will continue Depakote at this time. She is now on Abilify which is a new medication. Although anxious and subjectively agitated she does not appear akathetic and is not restless or hyperactive.  Treatment Plan Summary: Daily contact with patient to assess and evaluate symptoms and progress in treatment Medication  management See below   Plan: Continue Depakote  500 mgrs BID  For now continue Abilify 5 mgrs QHS  ( we discussed switching to Zyprexa as an option but prefers Abilify at this time)  Start Ativan 1 mgr BID to address anxiety, subjective agitation, improve insomnia Of note, patient wants to be tested for HIV and Syphillis.   Medical Decision Making Problem Points:  Established problem, stable/improving (1), Review of last therapy session (1) and Review of psycho-social stressors (1) Data Points:  Review of medication regiment & side effects (2) Review of new medications or change in dosage (2)  I certify that inpatient services furnished can reasonably be expected to improve the patient's condition.   COBOS, FERNANDO 12/26/2013, 11:35 AM

## 2013-12-26 NOTE — Progress Notes (Signed)
Patient ID: Melissa Dougherty, female   DOB: [birth year], 43 y.o.   MRN: [id number] D- Patient reports poor sleep and staff confirmed she only slept two hours of short periods of sleep.  Her energy level is low and her ability to concentrate is poor.  She describes feeling restless as well as depressed 5/10, hopeless 5/10 and anxious 6/10.  She denies thoughts of self harm.  She is asking questions about her medication and is planning to talk with MD about this.  A- Supported patient and talked with her about the recovery  and strength based assessments.  R- patient talked about coming into the hospital and how she knew she was sick and needed help and reached out to her foster sister Santiago Glad for help and support.  She describes feeling overwhelmed with job and work and quitting her job.  She also says "I think I have an eating disorder, I only eat one meal a day"  She describes being up most of the night.

## 2013-12-27 LAB — RPR

## 2013-12-27 LAB — HIV ANTIBODY (ROUTINE TESTING W REFLEX): HIV 1&2 Ab, 4th Generation: NONREACTIVE

## 2013-12-27 NOTE — Tx Team (Signed)
Interdisciplinary Treatment Plan Update   Date Reviewed:  12/27/2013  Time Reviewed:  9:27 AM  Progress in Treatment:   Attending groups: Yes Participating in groups: Yes Taking medication as prescribed: Yes  Tolerating medication: Yes Family/Significant other contact made:  Yes, collateral contact made with family. Patient understands diagnosis: Yes  Discussing patient identified problems/goals with staff: Yes Medical problems stabilized or resolved: Yes Denies suicidal/homicidal ideation: Yes Patient has not harmed self or others: Yes  For review of initial/current patient goals, please see plan of care.  Estimated Length of Stay:  2-3 days  Reasons for Continued Hospitalization:  Anxiety Depression Medication stabilization   New Problems/Goals identified:    Discharge Plan or Barriers:   Home with outpatient follow up to be determined  Additional Comments:  Patient signed 72 hour request on 12/26/13  Attendees:  Patient:  12/27/2013 9:27 AM   Signature:  Gabriel Earing, MD 12/27/2013 9:27 AM  Signature:  12/27/2013 9:27 AM  Signature:  12/27/2013 9:27 AM  Signature:Beverly Danelle Earthly, RN 12/27/2013 9:27 AM  Signature:  Thurnell Garbe RN 12/27/2013 9:27 AM  Signature:  Joette Catching, LCSW 12/27/2013 9:27 AM  Signature:   12/27/2013 9:27 AM  Signature:  Lucinda Dell, Care Coordinator [facility] 12/27/2013 9:27 AM  Signature:  12/27/2013 9:27 AM  Signature:  12/27/2013  9:27 AM  Signature:   Lars Pinks, RN [employer] 12/27/2013  9:27 AM  Signature: 12/27/2013  9:27 AM    Scribe for Treatment Team:   Joette Catching,  12/27/2013 9:27 AM

## 2013-12-27 NOTE — Progress Notes (Signed)
Patient ID: Melissa Dougherty, female   DOB: 1970/06/02, 43 y.o.   MRN: [id number] [facility] MD Progress Note  12/27/2013 3:19 PM NAMI STRAWDER  MRN:  [id number] Subjective:  She states that she is feeling better and is hoping to be discharged soon.  Objective:  Patient has been going to most groups, and in general has been calm, appropriate, although notes indicate she has left groups before they are over at times. I asked her about this and she states" it is just that I needed to go to the bathroon", and denies anxiety or paranoia as a reason. States she is happy with her current medication regime, which consists of Abilify and Depakote, and denies side effects. She has had no seizure activity on the unit- states that her last seizure was many years ago. She states she slept better last night, which is a major improvement for her as she has been experiencing insomnia. Labs reviewed- HIV and RPR are negative    Diagnosis: Bipolar Disorder Mixed     Total Time spent with patient: 25 minutes     ADL's:  Fair   Sleep:  Much improved   Appetite:  Fair   Suicidal Ideation:  Denies  Homicidal Ideation:  Denies  AEB (as evidenced by):  Psychiatric Specialty Exam: Physical Exam  Review of Systems  Constitutional: Negative for fever and chills.  Respiratory: Negative for shortness of breath.   Cardiovascular: Negative for chest pain and leg swelling.  Gastrointestinal: Negative for nausea, vomiting, abdominal pain, diarrhea and constipation.  Genitourinary: Negative for dysuria and urgency.  Skin: Negative for rash.  Psychiatric/Behavioral: Positive for depression. Negative for suicidal ideas, hallucinations and substance abuse.    Blood pressure 119/76, pulse 94, temperature 98.6 F (37 C), temperature source Oral, resp. rate 16, height 5\' 3"  (1.6 m), weight 63.504 kg (140 lb), last menstrual period 11/23/2013.Body mass index is 24.81 kg/(m^2).  General Appearance: Fairly Groomed   Engineer, water::  Good  Speech:  Normal Rate  Volume:  Decreased  Mood:  States her mood is improved, and no longer feels as depressed  Affect:   Somewhat restricted, blunted, but does smile appropriately at times during session  Thought Process:  Goal Directed  Orientation:  NA- fully alert and attentive   Thought Content:  no hallucinations, no delusions  Suicidal Thoughts:  No- denies any SI or HI and contracts for safety on the unit   Homicidal Thoughts:  No  Memory:  NA  Judgement:  Fair  Insight:  Fair  Psychomotor Activity:  Normal  Concentration:  Good  Recall:  Good  Fund of Knowledge:Good  Language: Good  Akathisia:  Negative  Handed:  Right  AIMS (if indicated):     Assets:  Communication Skills Desire for Improvement Resilience  Sleep:  Number of Hours: 5.75   Musculoskeletal: Strength & Muscle Tone: within normal limits Gait & Station: normal Patient leans: N/A  Current Medications: Current Facility-Administered Medications  Medication Dose Route Frequency Provider Last Rate Last Dose  . acetaminophen (TYLENOL) tablet 650 mg  650 mg Oral Q6H PRN Malena Peer, NP   650 mg at 12/26/13 0108  . alum & mag hydroxide-simeth (MAALOX/MYLANTA) 200-200-20 MG/5ML suspension 30 mL  30 mL Oral Q4H PRN Evanna Glenda Chroman, NP      . ARIPiprazole (ABILIFY) tablet 5 mg  5 mg Oral Daily Mojeed Akintayo   5 mg at 12/27/13 0806  . calcium carbonate (TUMS - dosed in mg elemental  calcium) chewable tablet 400 mg of elemental calcium  400 mg of elemental calcium Oral BID PRN Benjamine Mola, FNP      . divalproex (DEPAKOTE) DR tablet 500 mg  500 mg Oral BID PC Mojeed Akintayo   500 mg at 12/27/13 0807  . LORazepam (ATIVAN) tablet 1 mg  1 mg Oral BID Neita Garnet, MD   1 mg at 12/27/13 0807  . magnesium hydroxide (MILK OF MAGNESIA) suspension 30 mL  30 mL Oral Daily PRN Evanna Glenda Chroman, NP      . OLANZapine zydis (ZYPREXA) disintegrating tablet 10 mg  10 mg Oral Q8H PRN  Mojeed Akintayo   10 mg at 12/25/13 2115  . ondansetron (ZOFRAN) tablet 4 mg  4 mg Oral Q8H PRN Benjamine Mola, FNP   4 mg at 12/24/13 2138  . pantoprazole (PROTONIX) EC tablet 40 mg  40 mg Oral Daily Benjamine Mola, FNP   40 mg at 12/27/13 6160  . traZODone (DESYREL) tablet 100 mg  100 mg Oral QHS PRN,MR X 1 Mojeed Akintayo   100 mg at 12/26/13 2220    Lab Results:  Results for orders placed during the hospital encounter of 12/23/13 (from the past 48 hour(s))  HIV ANTIBODY (ROUTINE TESTING)     Status: None   Collection Time    12/27/13  6:25 AM      Result Value Ref Range   HIV 1&2 Ab, 4th Generation NONREACTIVE  NONREACTIVE   Comment: (NOTE)     A NONREACTIVE HIV Ag/Ab result does not exclude HIV infection since     the time frame for seroconversion is variable. If acute HIV infection     is suspected, a HIV-1 RNA Qualitative TMA test is recommended.     HIV-1/2 Antibody Diff         Not indicated.     HIV-1 RNA, Qual TMA           Not indicated.     PLEASE NOTE: This information has been disclosed to you from records     whose confidentiality may be protected by state law. If your state     requires such protection, then the state law prohibits you from making     any further disclosure of the information without the specific written     consent of the person to whom it pertains, or as otherwise permitted     by law. A general authorization for the release of medical or other     information is NOT sufficient for this purpose.     The performance of this assay has not been clinically validated in     patients less than 104 years old.     Performed at [facility]  RPR     Status: None   Collection Time    12/27/13  6:25 AM      Result Value Ref Range   RPR NON REAC  NON REAC   Comment: Performed at [facility]    Physical Findings: AIMS: Facial and Oral Movements Muscles of Facial Expression: None, normal Lips and Perioral Area: None, normal Jaw: None,  normal Tongue: None, normal,Extremity Movements Upper (arms, wrists, hands, fingers): None, normal Lower (legs, knees, ankles, toes): None, normal, Trunk Movements Neck, shoulders, hips: None, normal, Overall Severity Severity of abnormal movements (highest score from questions above): None, normal Incapacitation due to abnormal movements: None, normal Patient's awareness of abnormal movements (rate only patient's report): No Awareness,  Dental Status Current problems with teeth and/or dentures?: No Does patient usually wear dentures?: No  CIWA:  CIWA-Ar Total: 0 COWS:  COWS Total Score: 2  Assessment-  Reports ongoing improvement, a desire to go home soon, and denies side effects from her medications. Affect remains subdued, somewhat blunted.  Treatment Plan Summary: Daily contact with patient to assess and evaluate symptoms and progress in treatment Medication management See below   Plan: Continue Depakote  500 mgrs BID  Continue Abilify 5 mgrs QHS Continue Ativan 1 mgr BID  Patient plans to follow up at [facility] after discharge    Medical Decision Making Problem Points:  Established problem, stable/improving (1) Data Points:  Review or order clinical lab tests (1) Review of medication regiment & side effects (2)  I certify that inpatient services furnished can reasonably be expected to improve the patient's condition.   COBOS, FERNANDO 12/27/2013, 3:19 PM

## 2013-12-27 NOTE — Progress Notes (Signed)
Patient ID: Melissa Dougherty, female   DOB: [birth year], 43 y.o.   MRN: [id number] D- Patient was very happy to reports that she slept well last night, "I slept all night long".  She says her energy level is normal and her ability to concentrate is normal.  She is rating depression, anxiety and hopelessness at 0/10 today.  She has been attending groups, but sometimes leaves before group is over.  She decided not to go downstairs for lunch at the last minute saying she suddenly felt very tired. "I need a pick me up".  She tried to rest but was up again shortly.  A- Talked with patient about recovery and what she is thankful for and what her strengths are. She says she is going to try to be patient, listen to staff and try to get what she can from being here.  Patient seems to be getting more restless as day progresses.

## 2013-12-27 NOTE — Progress Notes (Signed)
D: Patient in the hallway on approach.  Patient states she had a better day.  Patient states she is anxious about being discharged tomorrow.  Patient states she ahs a lot of family issues to work out.  Patient denies SI/HI and denies AVH. A: Staff to monitor Q 15 mins for safety.  Encouragement and support offered.  Scheduled medications administered per orders. R: Patient remains safe on the unit.  Patient did not attend group tonight.  Patient taking administered medications.  Patient visible on the unit tonight.

## 2013-12-27 NOTE — Progress Notes (Signed)
Adult Psychoeducational Group Note  Date:  12/27/2013 Time:  9:12 PM  Group Topic/Focus:  Wrap-Up Group:   The focus of this group is to help patients review their daily goal of treatment and discuss progress on daily workbooks.  Participation Level:  Did Not Attend  Participation Quality:  did not attend  Affect:  did not attend  Cognitive:  did not attend  Insight: None  Engagement in Group:  did not attend  Modes of Intervention:  did not attend  Additional Comments:  Did not attend  Mady Gemma Latrice 12/27/2013, 9:12 PM

## 2013-12-27 NOTE — Progress Notes (Signed)
The focus of this group is to educate the patient on the purpose and policies of crisis stabilization and provide a format to answer questions about their admission.  The group details unit policies and expectations of patients while admitted. Patient did group exercises and shared her goal with group.  Her goal is to work on being patient and tryto learn everything she can while here

## 2013-12-27 NOTE — BHH Group Notes (Addendum)
[facility] LCSW Group Therapy      Feelings About Diagnosis 1:15 - 2:30 PM         12/27/2013    Type of Therapy:  Group Therapy  Participation Level:  Active  Participation Quality:  Appropriate  Affect:  Appropriate  Cognitive:  Alert and Appropriate  Insight:  Developing/Improving and Engaged  Engagement in Therapy:  Developing/Improving and Engaged  Modes of Intervention:  Discussion, Education, Exploration, Problem-Solving, Rapport Building, Support  Summary of Progress/Problems:  Patient actively participated in group. Patient discussed past and present diagnosis and the effects it has had on  life.  Patient talked about family and society being judgmental and the stigma associated with having a mental health diagnosis.  She shared she feels different from others and that her illness limits what she can accomplish.  Concha Pyo 12/27/2013

## 2013-12-27 NOTE — Progress Notes (Signed)
Recreation Therapy Notes  Animal-Assisted Activity/Therapy (AAA/T) Program Checklist/Progress Notes Patient Eligibility Criteria Checklist & Daily Group note for Rec Tx Intervention  Date: 08.04.2015 Time: 3:15pm Location: [address]   AAA/T Program Assumption of Risk Form signed by Patient/ or Parent Legal Guardian yes  Patient is free of allergies or sever asthma yes  Patient reports no fear of animals yes  Patient reports no history of cruelty to animals yes   Patient understands his/her participation is voluntary yes  Behavioral Response: Did not attend  [employer], LRT/CTRS  Valeria, Dusty Wagoner L 12/27/2013 4:16 PM

## 2013-12-28 LAB — VALPROIC ACID LEVEL: Valproic Acid Lvl: 80.6 ug/mL (ref 50.0–100.0)

## 2013-12-28 MED ORDER — ARIPIPRAZOLE 5 MG PO TABS: 5.0000 mg | ORAL_TABLET | Freq: Every day | ORAL | Status: DC

## 2013-12-28 MED ORDER — VITAMIN D 1000 UNITS PO TABS: 1000.0000 [IU] | ORAL_TABLET | Freq: Every day | ORAL | Status: DC

## 2013-12-28 MED ORDER — TRAZODONE HCL 100 MG PO TABS: 100.0000 mg | ORAL_TABLET | Freq: Every evening | ORAL | Status: DC | PRN

## 2013-12-28 MED ORDER — LORAZEPAM 1 MG PO TABS: 1.0000 mg | ORAL_TABLET | Freq: Two times a day (BID) | ORAL | Status: DC

## 2013-12-28 MED ORDER — DIVALPROEX SODIUM 500 MG PO DR TAB: 500.0000 mg | DELAYED_RELEASE_TABLET | Freq: Two times a day (BID) | ORAL | Status: DC

## 2013-12-28 MED ORDER — PANTOPRAZOLE SODIUM 40 MG PO TBEC: 40.0000 mg | DELAYED_RELEASE_TABLET | Freq: Every day | ORAL | Status: DC

## 2013-12-28 NOTE — Progress Notes (Signed)
[facility] Adult Case Management Discharge Plan :  Will you be returning to the same living situation after discharge: Yes,  Patient is returning to her home. At discharge, do you have transportation home?:Yes,  Patient assisted with bus pass. Do you have the ability to pay for your medications:Yes,  Patient has Medicaid.  Release of information consent forms completed and in the chart;  Patient's signature needed at discharge.  Patient to Follow up at: Follow-up Information   Follow up with [facility]. Call on 12/29/2013. (Please go to [facility] walk in clinic on Thursday, December 29, 2013 or any weekday  between [place] for medication managemen/counseling)    Specialty:  [place] information:   [place] [phone]       Patient denies SI/HI:  Patient no longer endorsing SI/HI or other thoughts of self harm.   Safety Planning and Suicide Prevention discussed: .Reviewed with all patients during discharge planning group   Nakaiya Beddow, Eulas Post 12/28/2013, 10:28 AM
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
## 2013-12-28 NOTE — Progress Notes (Signed)
Pt was discharged home today. She denied any S/I H/I or A/V hallucinations.  She was given f/u appointment, rx, sample medications, hotline info booklet, and bus pass. She voiced understanding to all instructions provided. She declined the need for smoking cessation materials.
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
## 2013-12-30 NOTE — Progress Notes (Signed)
Patient Discharge Instructions:  After Visit Summary (AVS):   Faxed to:  12/30/13 Psychiatric Admission Assessment Note:   Faxed to:  12/30/13 Suicide Risk Assessment - Discharge Assessment:   Faxed to:  12/30/13 Faxed/Sent to the Next Level Care provider:  12/30/13 Faxed to [facility] [place], 12/30/2013, 3:57 PM

## 2014-01-04 ENCOUNTER — Encounter (HOSPITAL_COMMUNITY): Payer: Self-pay | Admitting: Emergency Medicine

## 2014-01-04 ENCOUNTER — Emergency Department (HOSPITAL_COMMUNITY)
Admission: EM | Admit: 2014-01-04 | Discharge: 2014-01-05 | Disposition: A | Discharge status: Home / Self Care | Payer: Medicare HMO | Attending: Emergency Medicine | Admitting: Emergency Medicine

## 2014-01-04 DIAGNOSIS — Z79899 Other long term (current) drug therapy: Secondary | ICD-10-CM | POA: Insufficient documentation

## 2014-01-04 DIAGNOSIS — M79605 Pain in left leg: Secondary | ICD-10-CM

## 2014-01-04 DIAGNOSIS — F3289 Other specified depressive episodes: Secondary | ICD-10-CM | POA: Diagnosis not present

## 2014-01-04 DIAGNOSIS — M79609 Pain in unspecified limb: Secondary | ICD-10-CM | POA: Diagnosis present

## 2014-01-04 DIAGNOSIS — Z8669 Personal history of other diseases of the nervous system and sense organs: Secondary | ICD-10-CM | POA: Insufficient documentation

## 2014-01-04 DIAGNOSIS — M79604 Pain in right leg: Secondary | ICD-10-CM

## 2014-01-04 DIAGNOSIS — F329 Major depressive disorder, single episode, unspecified: Secondary | ICD-10-CM | POA: Diagnosis not present

## 2014-01-04 DIAGNOSIS — Z3202 Encounter for pregnancy test, result negative: Secondary | ICD-10-CM | POA: Insufficient documentation

## 2014-01-04 MED ORDER — OXYCODONE-ACETAMINOPHEN 5-325 MG PO TABS
1.0000 | ORAL_TABLET | Freq: Once | ORAL | Status: AC
  Administered 2014-01-04: 1 via ORAL
  Filled 2014-01-04: qty 1

## 2014-01-04 NOTE — ED Notes (Addendum)
Pt presents from home with c/o burning sensation to BLE from feet upper thighs.  Pt reports she was on a walk yesterday when the symptoms began and the burning has gotten progressively worse to where she is unable to ambulate due to the pain. Pt saw her PCP today for the same and was referred to a foot specialist. PT was hospitalized last week (12/29/13-12/31/13) and was taken off of Respirdol and was placed on Trazodone at that time - concerned for side effect of medication.

## 2014-01-05 DIAGNOSIS — M79609 Pain in unspecified limb: Secondary | ICD-10-CM | POA: Diagnosis not present

## 2014-01-05 LAB — CBC WITH DIFFERENTIAL/PLATELET
Basophils Absolute: 0 10*3/uL (ref 0.0–0.1)
Basophils Relative: 1 % (ref 0–1)
Eosinophils Absolute: 0.1 10*3/uL (ref 0.0–0.7)
Eosinophils Relative: 2 % (ref 0–5)
HCT: 36.4 % (ref 36.0–46.0)
Hemoglobin: 12.1 g/dL (ref 12.0–15.0)
Lymphocytes Relative: 41 % (ref 12–46)
Lymphs Abs: 1.8 10*3/uL (ref 0.7–4.0)
MCH: 22.7 pg — ABNORMAL LOW (ref 26.0–34.0)
MCHC: 33.2 g/dL (ref 30.0–36.0)
MCV: 68.4 fL — ABNORMAL LOW (ref 78.0–100.0)
Monocytes Absolute: 0.4 10*3/uL (ref 0.1–1.0)
Monocytes Relative: 8 % (ref 3–12)
Neutro Abs: 2.1 10*3/uL (ref 1.7–7.7)
Neutrophils Relative %: 48 % (ref 43–77)
Platelets: 152 10*3/uL (ref 150–400)
RBC: 5.32 MIL/uL — ABNORMAL HIGH (ref 3.87–5.11)
RDW: 18.5 % — ABNORMAL HIGH (ref 11.5–15.5)
WBC: 4.4 10*3/uL (ref 4.0–10.5)

## 2014-01-05 LAB — COMPREHENSIVE METABOLIC PANEL
ALT: 13 U/L (ref 0–35)
AST: 26 U/L (ref 0–37)
Albumin: 3 g/dL — ABNORMAL LOW (ref 3.5–5.2)
Alkaline Phosphatase: 62 U/L (ref 39–117)
Anion gap: 11 (ref 5–15)
BUN: 8 mg/dL (ref 6–23)
CO2: 25 mEq/L (ref 19–32)
Calcium: 8.3 mg/dL — ABNORMAL LOW (ref 8.4–10.5)
Chloride: 99 mEq/L (ref 96–112)
Creatinine, Ser: 0.74 mg/dL (ref 0.50–1.10)
GFR calc Af Amer: >90 mL/min (ref >90)
GFR calc non Af Amer: >90 mL/min (ref >90)
Glucose, Bld: 117 mg/dL — ABNORMAL HIGH (ref 70–99)
Potassium: 4.1 mEq/L (ref 3.7–5.3)
Sodium: 135 mEq/L — ABNORMAL LOW (ref 137–147)
Total Bilirubin: <0.2 mg/dL — ABNORMAL LOW (ref 0.3–1.2)
Total Protein: 7 g/dL (ref 6.0–8.3)

## 2014-01-05 LAB — URINALYSIS, ROUTINE W REFLEX MICROSCOPIC
Bilirubin Urine: NEGATIVE
Glucose, UA: NEGATIVE mg/dL
Hgb urine dipstick: NEGATIVE
Ketones, ur: 15 mg/dL — AB
Leukocytes, UA: NEGATIVE
Nitrite: NEGATIVE
Protein, ur: NEGATIVE mg/dL
Specific Gravity, Urine: 1.026 (ref 1.005–1.030)
Urobilinogen, UA: 1 mg/dL (ref 0.0–1.0)
pH: 6 (ref 5.0–8.0)

## 2014-01-05 LAB — ETHANOL: Alcohol, Ethyl (B): <11 mg/dL (ref 0–11)

## 2014-01-05 LAB — RAPID URINE DRUG SCREEN, HOSP PERFORMED
Amphetamines: NOT DETECTED
Barbiturates: NOT DETECTED
Benzodiazepines: NOT DETECTED
Cocaine: NOT DETECTED
Opiates: NOT DETECTED
Tetrahydrocannabinol: NOT DETECTED

## 2014-01-05 LAB — VALPROIC ACID LEVEL: Valproic Acid Lvl: 24.8 ug/mL — ABNORMAL LOW (ref 50.0–100.0)

## 2014-01-05 LAB — PREGNANCY, URINE: Preg Test, Ur: NEGATIVE

## 2014-01-05 MED ORDER — HYDROCODONE-ACETAMINOPHEN 5-325 MG PO TABS: 1.0000 | ORAL_TABLET | ORAL | Status: DC | PRN

## 2014-01-05 NOTE — Discharge Instructions (Signed)
Hydrocodone as prescribed as needed for pain.  Followup with your psychiatrist to discuss your medications.   Musculoskeletal Pain Musculoskeletal pain is muscle and boney aches and pains. These pains can occur in any part of the body. Your caregiver may treat you without knowing the cause of the pain. They may treat you if blood or urine tests, X-rays, and other tests were normal.  CAUSES There is often not a definite cause or reason for these pains. These pains may be caused by a type of germ (virus). The discomfort may also come from overuse. Overuse includes working out too hard when your body is not fit. Boney aches also come from weather changes. Bone is sensitive to atmospheric pressure changes. HOME CARE INSTRUCTIONS   Ask when your test results will be ready. Make sure you get your test results.  Only take over-the-counter or prescription medicines for pain, discomfort, or fever as directed by your caregiver. If you were given medications for your condition, do not drive, operate machinery or power tools, or sign legal documents for 24 hours. Do not drink alcohol. Do not take sleeping pills or other medications that may interfere with treatment.  Continue all activities unless the activities cause more pain. When the pain lessens, slowly resume normal activities. Gradually increase the intensity and duration of the activities or exercise.  During periods of severe pain, bed rest may be helpful. Lay or sit in any position that is comfortable.  Putting ice on the injured area.  Put ice in a bag.  Place a towel between your skin and the bag.  Leave the ice on for 15 to 20 minutes, 3 to 4 times a day.  Follow up with your caregiver for continued problems and no reason can be found for the pain. If the pain becomes worse or does not go away, it may be necessary to repeat tests or do additional testing. Your caregiver may need to look further for a possible cause. SEEK IMMEDIATE  MEDICAL CARE IF:  You have pain that is getting worse and is not relieved by medications.  You develop chest pain that is associated with shortness or breath, sweating, feeling sick to your stomach (nauseous), or throw up (vomit).  Your pain becomes localized to the abdomen.  You develop any new symptoms that seem different or that concern you. MAKE SURE YOU:   Understand these instructions.  Will watch your condition.  Will get help right away if you are not doing well or get worse. Document Released: 05/12/2005 Document Revised: 08/04/2011 Document Reviewed: 01/14/2013 [facility] Patient Information 2015 [place], [place]. This information is not intended to replace advice given to you by your health care provider. Make sure you discuss any questions you have with your health care provider.  Neuropathic Pain We often think that pain has a physical cause. If we get rid of the cause, the pain should go away. Nerves themselves can also cause pain. It is called neuropathic pain, which means nerve abnormality. It may be difficult for the patients who have it and for the treating caregivers. Pain is usually described as acute (short-lived) or chronic (long-lasting). Acute pain is related to the physical sensations caused by an injury. It can last from a few seconds to many weeks, but it usually goes away when normal healing occurs. Chronic pain lasts beyond the typical healing time. With neuropathic pain, the nerve fibers themselves may be damaged or injured. They then send incorrect signals to other pain centers. The  pain you feel is real, but the cause is not easy to find.  CAUSES  Chronic pain can result from diseases, such as diabetes and shingles (an infection related to chickenpox), or from trauma, surgery, or amputation. It can also happen without any known injury or disease. The nerves are sending pain messages, even though there is no identifiable cause for such messages.   Other common  causes of neuropathy include diabetes, phantom limb pain, or Regional Pain Syndrome (RPS).  As with all forms of chronic back pain, if neuropathy is not correctly treated, there can be a number of associated problems that lead to a downward cycle for the patient. These include depression, sleeplessness, feelings of fear and anxiety, limited social interaction and inability to do normal daily activities or work.  The most dramatic and mysterious example of neuropathic pain is called "phantom limb syndrome." This occurs when an arm or a leg has been removed because of illness or injury. The brain still gets pain messages from the nerves that originally carried impulses from the missing limb. These nerves now seem to misfire and cause troubling pain.  Neuropathic pain often seems to have no cause. It responds poorly to standard pain treatment. Neuropathic pain can occur after:  Shingles (herpes zoster virus infection).  A lasting burning sensation of the skin, caused usually by injury to a peripheral nerve.  Peripheral neuropathy which is widespread nerve damage, often caused by diabetes or alcoholism.  Phantom limb pain following an amputation.  Facial nerve problems (trigeminal neuralgia).  Multiple sclerosis.  Reflex sympathetic dystrophy.  Pain which comes with cancer and cancer chemotherapy.  Entrapment neuropathy such as when pressure is put on a nerve such as in carpal tunnel syndrome.  Back, leg, and hip problems (sciatica).  Spine or back surgery.  HIV Infection or AIDS where nerves are infected by viruses. Your caregiver can explain items in the above list which may apply to you. SYMPTOMS  Characteristics of neuropathic pain are:  Severe, sharp, electric shock-like, shooting, lightening-like, knife-like.  Pins and needles sensation.  Deep burning, deep cold, or deep ache.  Persistent numbness, tingling, or weakness.  Pain resulting from light touch or other stimulus  that would not usually cause pain.  Increased sensitivity to something that would normally cause pain, such as a pinprick. Pain may persist for months or years following the healing of damaged tissues. When this happens, pain signals no longer sound an alarm about current injuries or injuries about to happen. Instead, the alarm system itself is not working correctly.  Neuropathic pain may get worse instead of better over time. For some people, it can lead to serious disability. It is important to be aware that severe injury in a limb can occur without a proper, protective pain response.Burns, cuts, and other injuries may go unnoticed. Without proper treatment, these injuries can become infected or lead to further disability. Take any injury seriously, and consult your caregiver for treatment. DIAGNOSIS  When you have a pain with no known cause, your caregiver will probably ask some specific questions:   Do you have any other conditions, such as diabetes, shingles, multiple sclerosis, or HIV infection?  How would you describe your pain? (Neuropathic pain is often described as shooting, stabbing, burning, or searing.)  Is your pain worse at any time of the day? (Neuropathic pain is usually worse at night.)  Does the pain seem to follow a certain physical pathway?  Does the pain come from an area that  has missing or injured nerves? (An example would be phantom limb pain.)  Is the pain triggered by minor things such as rubbing against the sheets at night? These questions often help define the type of pain involved. Once your caregiver knows what is happening, treatment can begin. Anticonvulsant, antidepressant drugs, and various pain relievers seem to work in some cases. If another condition, such as diabetes is involved, better management of that disorder may relieve the neuropathic pain.  TREATMENT  Neuropathic pain is frequently long-lasting and tends not to respond to treatment with narcotic  type pain medication. It may respond well to other drugs such as antiseizure and antidepressant medications. Usually, neuropathic problems do not completely go away, but partial improvement is often possible with proper treatment. Your caregivers have large numbers of medications available to treat you. Do not be discouraged if you do not get immediate relief. Sometimes different medications or a combination of medications will be tried before you receive the results you are hoping for. See your caregiver if you have pain that seems to be coming from nowhere and does not go away. Help is available.  SEEK IMMEDIATE MEDICAL CARE IF:   There is a sudden change in the quality of your pain, especially if the change is on only one side of the body.  You notice changes of the skin, such as redness, black or purple discoloration, swelling, or an ulcer.  You cannot move the affected limbs. Document Released: 02/07/2004 Document Revised: 08/04/2011 Document Reviewed: 02/07/2004 [facility] Patient Information 2015 [place], [place]. This information is not intended to replace advice given to you by your health care provider. Make sure you discuss any questions you have with your health care provider.

## 2014-01-05 NOTE — Discharge Summary (Signed)
Physician Discharge Summary Note  Patient:  Melissa Dougherty is an 43 y.o., female MRN:  [id number] DOB:  03-25-1971 Patient phone:  [phone] (home)  Patient address:   [place] [address],  Total Time spent with patient: 30 minutes  Date of Admission:  12/23/2013 Date of Discharge: 12/28/2013  Reason for Admission:  MDD with SI  Discharge Diagnoses: Principal Problem:   Bipolar I disorder, most recent episode (or current) mixed, moderate Active Problems:   SEIZURE DISORDER, HX OF   MDD (major depressive disorder)   Psychiatric Specialty Exam: Physical Exam  ROS  Blood pressure 112/78, pulse 95, temperature 97.7 F (36.5 C), temperature source Oral, resp. rate 16, height 5\' 3"  (1.6 m), weight 63.504 kg (140 lb), last menstrual period 11/23/2013.Body mass index is 24.81 kg/(m^2).   General Appearance: Fairly Groomed   Engineer, water:: Good   Speech: Normal Rate   Volume: Normal   Mood: Euthymic   Affect: more reactive. Patient seems better related and more relaxed than upon admission   Thought Process: Goal Directed and Linear   Orientation: NA- fully alert and attentive   Thought Content: denies hallucinations, no delusions expressed, does not appear internally preoccupied   Suicidal Thoughts: No- denies any suicidal or homicidal ideations.   Homicidal Thoughts: No   Memory: NA   Judgement: Fair   Insight: Fair   Psychomotor Activity: Normal   Concentration: Good   Recall: Good   Fund of Knowledge:Good   Language: Good   Akathisia: Negative   Handed: Right   AIMS (if indicated):   Assets: Communication Skills  Desire for Improvement  Resilience   Sleep: Number of Hours: 5.75    Musculoskeletal:  Strength & Muscle Tone: within normal limits  Gait & Station: normal  Patient leans: N/A     DSM5:  AXIS I: Bipolar, mixed  AXIS II: Deferred  AXIS III:  Past Medical History   Diagnosis  Date   .  Seizures    .  Alcohol abuse    .   Bipolar disorder    .  Depression     AXIS IV: economic problems, occupational problems and problems related to social environment  AXIS V: 51-60 moderate symptoms ( 55-60 upon discharge)     Level of Care:  OP  Hospital Course:  Melissa Dougherty is an 43 y.o. female who presents to [facility] accompanied by her sister, who did not participate in assessment. Pt was discharged from Observation Unit at [facility] seven hours ago (see notes from earlier today for additional clinical details). Per Lavell Luster, AC at [facility], Melissa Chroman, NP has been briefed on Pt's situation and wants Pt admitted to [facility]. Pt reports that she was discharged with prescriptions for Depakote and Risperdal but her pharmacy would not fill the prescription because she had had them refilled too recently and her insurance would not pay for another refill. Pt states she discovered today that a man in her neighborhood with whom she has been having a relationship for the past ten years has also been having a relationship with a married woman in the neighborhood for the past twenty years. Pt states the man "didn't really love me" and "was just using me." Pt is tearful and distraught. Pt says there was "a big drama" today. She states she feels unstable and unsafe with no medications to help her. She denies current suicidal ideation but admits to recent suicidal thoughts. She  denies homicidal ideation or history of violence. Pt denies any auditory or visual hallucinations.  Pt is casually dressed, poorly groomed, alert, oriented x4 with normal speech and normal motor behavior. Eye contact is good and Pt tearful during assessment. Pt's mood is depressed, anxious and irritable and affect is labile. Thought process is coherent and relevant. There is no indication Pt is currently responding to internal stimuli or experiencing delusional thought content. Pt's insight and judgment appears limited. She was generally cooperative during  assessment and is requesting inpatient treatment.  During Hospitalization: Medications managed, psychoeducation, group and individual therapy. Pt currently denies SI, HI, and Psychosis. At discharge, pt rates anxiety and depression as minimal. Pt states that he does have a good supportive home environment and will followup with outpatient treatment. Affirms agreement with medication regimen and discharge plan. Denies other physical and psychological concerns at time of discharge.    Consults:  None  Significant Diagnostic Studies:  None  Discharge Vitals:   Blood pressure 112/78, pulse 95, temperature 97.7 F (36.5 C), temperature source Oral, resp. rate 16, height 5\' 3"  (1.6 m), weight 63.504 kg (140 lb), last menstrual period 11/23/2013. Body mass index is 24.81 kg/(m^2). Lab Results:   No results found for this or any previous visit (from the past 72 hour(s)).  Physical Findings: AIMS: Facial and Oral Movements Muscles of Facial Expression: None, normal Lips and Perioral Area: None, normal Jaw: None, normal Tongue: None, normal,Extremity Movements Upper (arms, wrists, hands, fingers): None, normal Lower (legs, knees, ankles, toes): None, normal, Trunk Movements Neck, shoulders, hips: None, normal, Overall Severity Severity of abnormal movements (highest score from questions above): None, normal Incapacitation due to abnormal movements: None, normal Patient's awareness of abnormal movements (rate only patient's report): No Awareness, Dental Status Current problems with teeth and/or dentures?: No Does patient usually wear dentures?: No  CIWA:  CIWA-Ar Total: 0 COWS:  COWS Total Score: 2  Psychiatric Specialty Exam: See Psychiatric Specialty Exam and Suicide Risk Assessment completed by Attending Physician prior to discharge.  Discharge destination:  Home  Is patient on multiple antipsychotic therapies at discharge:  No   Has Patient had three or more failed trials of  antipsychotic monotherapy by history:  No  Recommended Plan for Multiple Antipsychotic Therapies: NA     Medication List    STOP taking these medications       divalproex 250 MG 24 hr tablet  Commonly known as:  DEPAKOTE ER  Replaced by:  divalproex 500 MG DR tablet     risperiDONE 1 MG tablet  Commonly known as:  RISPERDAL     traZODone 50 MG tablet  Commonly known as:  DESYREL      TAKE these medications     Indication   cholecalciferol 1000 UNITS tablet  Commonly known as:  VITAMIN D  Take 1 tablet (1,000 Units total) by mouth daily.   Indication:  Vitamin deficiency     divalproex 500 MG DR tablet  Commonly known as:  DEPAKOTE  Take 1 tablet (500 mg total) by mouth 2 (two) times daily after a meal.   Indication:  Complex Partial Epilepsy, mood stabilization     pantoprazole 40 MG tablet  Commonly known as:  PROTONIX  Take 1 tablet (40 mg total) by mouth daily.   Indication:  Gastroesophageal Reflux Disease           Follow-up Information   Follow up with [facility]. Call on 12/29/2013. (Please go to [facility] walk in clinic  on Thursday, December 29, 2013 or any weekday  between [facility] for medication managemen/counseling)    Specialty:  [facility] information:   [place] [phone]       Follow-up recommendations:  Activity:  As tolerated Diet:  heart healthy with low sodium  Comments:  Take all medications as prescribed. Keep all follow-up appointments as scheduled.  Do not consume alcohol or use illegal drugs while on prescription medications. Report any adverse effects from your medications to your primary care provider promptly.  In the event of recurrent symptoms or worsening symptoms, call 911, a crisis hotline, or go to the nearest emergency department for evaluation.   Total Discharge Time:  Greater than 30 minutes.  Signed: Benjamine Mola, FNP-BC 12/28/2013, 12:03 PM  Patient seen, Suicide  Assessment Completed.  Disposition Plan Reviewed

## 2014-01-05 NOTE — ED Provider Notes (Signed)
CSN: [id number]     Arrival date & time 01/04/14  2021 History   First MD Initiated Contact with Patient 01/05/14 0010     Chief Complaint  Patient presents with  . Leg Pain     (Consider location/radiation/quality/duration/timing/severity/associated sxs/prior Treatment) HPI Comments: Patient is a 43 year old female with history of seizures, bipolar, and depression. She presents with complaints of pain in her legs in the absence of any injury or trauma. She was recently admitted to behavioral health and had several medication changes. She states since that time she has been unable to sleep or get any rest. She denies any fevers or chills. She denies any suicidal or homicidal ideation. She denies any auditory or visual hallucinations  Patient is a 43 y.o. female presenting with leg pain. The history is provided by the patient.  Leg Pain Time since incident:  2 weeks Pain details:    Quality:  Burning   Radiates to:  Does not radiate   Severity:  Moderate   Onset quality:  Gradual   Timing:  Constant   Past Medical History  Diagnosis Date  . Seizures   . Alcohol abuse   . Bipolar disorder   . Depression    Past Surgical History  Procedure Laterality Date  . Back surgery    . Tubal ligation     Family History  Problem Relation Age of Onset  . Diabetes Mother   . Hypertension Mother    History  Substance Use Topics  . Smoking status: Never Smoker   . Smokeless tobacco: Not on file  . Alcohol Use: No   OB History   Grav Para Term Preterm Abortions TAB SAB Ect Mult Living   2 2 2       2      Review of Systems  All other systems reviewed and are negative.     Allergies  Risperdal  Home Medications   Prior to Admission medications   Medication Sig Start Date End Date Taking? Authorizing Provider  cholecalciferol (VITAMIN D) 1000 UNITS tablet Take 1 tablet (1,000 Units total) by mouth daily. 12/28/13  Yes Benjamine Mola, FNP  divalproex (DEPAKOTE) 250 MG DR  tablet Take 250 mg by mouth daily.   Yes Historical Provider, MD  divalproex (DEPAKOTE) 500 MG DR tablet Take 1 tablet (500 mg total) by mouth 2 (two) times daily after a meal. 12/28/13  Yes Benjamine Mola, FNP  pantoprazole (PROTONIX) 40 MG tablet Take 1 tablet (40 mg total) by mouth daily. 12/28/13  Yes Benjamine Mola, FNP  traZODone (DESYREL) 100 MG tablet Take 100 mg by mouth at bedtime as needed and may repeat dose one time if needed for sleep. 12/28/13  Yes Benjamine Mola, FNP   BP 91/61  Pulse 74  Temp(Src) 97.7 F (36.5 C) (Oral)  Resp 17  Ht 5\' 1"  (1.549 m)  Wt 140 lb (63.504 kg)  BMI 26.47 kg/m2  SpO2 99%  LMP 11/23/2013 Physical Exam  Nursing note and vitals reviewed. Constitutional: She is oriented to person, place, and time. She appears well-developed and well-nourished. No distress.  HENT:  Head: Normocephalic and atraumatic.  Neck: Normal range of motion. Neck supple.  Cardiovascular: Normal rate and regular rhythm.  Exam reveals no gallop and no friction rub.   No murmur heard. Pulmonary/Chest: Effort normal and breath sounds normal. No respiratory distress. She has no wheezes.  Abdominal: Soft. Bowel sounds are normal. She exhibits no distension. There is no tenderness.  Musculoskeletal: Normal range of motion.  Neurological: She is alert and oriented to person, place, and time.  Skin: Skin is warm and dry. She is not diaphoretic.    ED Course  Procedures (including critical care time) Labs Review Labs Reviewed  CBC WITH DIFFERENTIAL - Abnormal; Notable for the following:    RBC 5.32 (*)    MCV 68.4 (*)    MCH 22.7 (*)    RDW 18.5 (*)    All other components within normal limits  COMPREHENSIVE METABOLIC PANEL - Abnormal; Notable for the following:    Sodium 135 (*)    Glucose, Bld 117 (*)    Calcium 8.3 (*)    Albumin 3.0 (*)    Total Bilirubin <0.2 (*)    All other components within normal limits  VALPROIC ACID LEVEL - Abnormal; Notable for the following:     Valproic Acid Lvl 24.8 (*)    All other components within normal limits  URINALYSIS, ROUTINE W REFLEX MICROSCOPIC - Abnormal; Notable for the following:    APPearance CLOUDY (*)    Ketones, ur 15 (*)    All other components within normal limits  ETHANOL  URINE RAPID DRUG SCREEN (HOSP PERFORMED)  PREGNANCY, URINE    Imaging Review No results found.   EKG Interpretation None      MDM   Final diagnoses:  None    I am uncertain as to the etiology of her leg discomfort, however nothing appears emergent. Her laboratory studies are unremarkable and physical exam is nonfocal. I will treat her pain and advised her to followup with her psychiatrist to discuss her medications as I am uncomfortable making recommendations on this subject. She understands to return if her symptoms worsen or change.  She is not suicidal or homicidal and does not request any psychiatric inpatient care.    Veryl Speak, MD 01/05/14 [phone]

## 2014-01-08 ENCOUNTER — Emergency Department (HOSPITAL_COMMUNITY)
Admission: EM | Admit: 2014-01-08 | Discharge: 2014-01-09 | Disposition: A | Discharge status: Other Acute Inpatient Hospital | Payer: Medicare HMO | Attending: Emergency Medicine | Admitting: Emergency Medicine

## 2014-01-08 ENCOUNTER — Ambulatory Visit (HOSPITAL_COMMUNITY)
Admission: AD | Admit: 2014-01-08 | Discharge: 2014-01-08 | Disposition: A | Discharge status: Home / Self Care | Payer: Medicare HMO | Source: Ambulatory Visit | Attending: Psychiatry | Admitting: Psychiatry

## 2014-01-08 ENCOUNTER — Encounter (HOSPITAL_COMMUNITY): Payer: Self-pay | Admitting: Emergency Medicine

## 2014-01-08 ENCOUNTER — Ambulatory Visit (HOSPITAL_COMMUNITY): Admission: AD | Admit: 2014-01-08 | Payer: Medicare HMO | Source: Home / Self Care | Admitting: Psychiatry

## 2014-01-08 DIAGNOSIS — F3189 Other bipolar disorder: Secondary | ICD-10-CM | POA: Insufficient documentation

## 2014-01-08 DIAGNOSIS — F3162 Bipolar disorder, current episode mixed, moderate: Secondary | ICD-10-CM

## 2014-01-08 DIAGNOSIS — Z3202 Encounter for pregnancy test, result negative: Secondary | ICD-10-CM | POA: Diagnosis not present

## 2014-01-08 DIAGNOSIS — Z79899 Other long term (current) drug therapy: Secondary | ICD-10-CM | POA: Diagnosis not present

## 2014-01-08 DIAGNOSIS — F3112 Bipolar disorder, current episode manic without psychotic features, moderate: Secondary | ICD-10-CM | POA: Diagnosis present

## 2014-01-08 DIAGNOSIS — F319 Bipolar disorder, unspecified: Secondary | ICD-10-CM | POA: Insufficient documentation

## 2014-01-08 DIAGNOSIS — F3010 Manic episode without psychotic symptoms, unspecified: Secondary | ICD-10-CM

## 2014-01-08 DIAGNOSIS — G40909 Epilepsy, unspecified, not intractable, without status epilepticus: Secondary | ICD-10-CM | POA: Diagnosis not present

## 2014-01-08 DIAGNOSIS — G479 Sleep disorder, unspecified: Secondary | ICD-10-CM | POA: Insufficient documentation

## 2014-01-08 DIAGNOSIS — R630 Anorexia: Secondary | ICD-10-CM | POA: Diagnosis not present

## 2014-01-08 LAB — CBC
HCT: 38.9 % (ref 36.0–46.0)
Hemoglobin: 12.7 g/dL (ref 12.0–15.0)
MCH: 22.4 pg — ABNORMAL LOW (ref 26.0–34.0)
MCHC: 32.6 g/dL (ref 30.0–36.0)
MCV: 68.6 fL — ABNORMAL LOW (ref 78.0–100.0)
Platelets: 174 10*3/uL (ref 150–400)
RBC: 5.67 MIL/uL — ABNORMAL HIGH (ref 3.87–5.11)
RDW: 18.4 % — ABNORMAL HIGH (ref 11.5–15.5)
WBC: 5.5 10*3/uL (ref 4.0–10.5)

## 2014-01-08 LAB — COMPREHENSIVE METABOLIC PANEL
ALT: 12 U/L (ref 0–35)
AST: 23 U/L (ref 0–37)
Albumin: 3.3 g/dL — ABNORMAL LOW (ref 3.5–5.2)
Alkaline Phosphatase: 67 U/L (ref 39–117)
Anion gap: 13 (ref 5–15)
BUN: 6 mg/dL (ref 6–23)
CO2: 22 mEq/L (ref 19–32)
Calcium: 9.1 mg/dL (ref 8.4–10.5)
Chloride: 101 mEq/L (ref 96–112)
Creatinine, Ser: 0.79 mg/dL (ref 0.50–1.10)
GFR calc Af Amer: >90 mL/min (ref >90)
GFR calc non Af Amer: >90 mL/min (ref >90)
Glucose, Bld: 90 mg/dL (ref 70–99)
Potassium: 3.7 mEq/L (ref 3.7–5.3)
Sodium: 136 mEq/L — ABNORMAL LOW (ref 137–147)
Total Bilirubin: 0.5 mg/dL (ref 0.3–1.2)
Total Protein: 7.7 g/dL (ref 6.0–8.3)

## 2014-01-08 LAB — RAPID URINE DRUG SCREEN, HOSP PERFORMED
Amphetamines: NOT DETECTED
Barbiturates: NOT DETECTED
Benzodiazepines: NOT DETECTED
Cocaine: NOT DETECTED
Opiates: NOT DETECTED
Tetrahydrocannabinol: NOT DETECTED

## 2014-01-08 LAB — POC URINE PREG, ED: Preg Test, Ur: NEGATIVE

## 2014-01-08 LAB — ETHANOL: Alcohol, Ethyl (B): <11 mg/dL (ref 0–11)

## 2014-01-08 LAB — ACETAMINOPHEN LEVEL: Acetaminophen (Tylenol), Serum: <15 ug/mL (ref 10–30)

## 2014-01-08 LAB — SALICYLATE LEVEL: Salicylate Lvl: <2 mg/dL — ABNORMAL LOW (ref 2.8–20.0)

## 2014-01-08 MED ORDER — ONDANSETRON HCL 4 MG PO TABS: 4.0000 mg | ORAL_TABLET | Freq: Three times a day (TID) | ORAL | Status: DC | PRN

## 2014-01-08 MED ORDER — IBUPROFEN 200 MG PO TABS
600.0000 mg | ORAL_TABLET | Freq: Three times a day (TID) | ORAL | Status: DC | PRN
  Administered 2014-01-08: 600 mg via ORAL
  Filled 2014-01-08: qty 3

## 2014-01-08 MED ORDER — TRAZODONE HCL 100 MG PO TABS
100.0000 mg | ORAL_TABLET | Freq: Every evening | ORAL | Status: DC | PRN
  Administered 2014-01-08: 100 mg via ORAL
  Filled 2014-01-08: qty 1

## 2014-01-08 MED ORDER — MIRTAZAPINE 30 MG PO TABS
30.0000 mg | ORAL_TABLET | Freq: Every day | ORAL | Status: DC
  Administered 2014-01-08: 30 mg via ORAL
  Filled 2014-01-08: qty 1

## 2014-01-08 MED ORDER — DIVALPROEX SODIUM 250 MG PO DR TAB
250.0000 mg | DELAYED_RELEASE_TABLET | Freq: Every day | ORAL | Status: DC
  Administered 2014-01-08: 250 mg via ORAL
  Filled 2014-01-08: qty 1

## 2014-01-08 MED ORDER — ACETAMINOPHEN 325 MG PO TABS: 650.0000 mg | ORAL_TABLET | ORAL | Status: DC | PRN

## 2014-01-08 MED ORDER — DIVALPROEX SODIUM 500 MG PO DR TAB
500.0000 mg | DELAYED_RELEASE_TABLET | Freq: Two times a day (BID) | ORAL | Status: DC
  Administered 2014-01-08 – 2014-01-09 (×2): 500 mg via ORAL
  Filled 2014-01-08 (×2): qty 1

## 2014-01-08 MED ORDER — PANTOPRAZOLE SODIUM 40 MG PO TBEC
40.0000 mg | DELAYED_RELEASE_TABLET | Freq: Every day | ORAL | Status: DC
  Administered 2014-01-09: 40 mg via ORAL
  Filled 2014-01-08 (×2): qty 1

## 2014-01-08 NOTE — ED Notes (Signed)
Pt was discharged from [facility] last week. Pt has a history of Bipolar and experiencing a manic episode. Pt went to [facility], which changed her medications, however it hasn't helped. Pt then went to [facility], pt sent to [facility] ED due to no beds available, however once a bed is available she has been accepted to [facility]. Pt reports racing thoughts, cussing her loved ones out, and insomnia. Pt denies SI/HI. Pt denies auditory and visual hallucinations. Pt denies using recreational drugs, pt states that she has been clean from alcohol and drugs for six months. Pt reports that she may be pregnant. Pt also feels paranoid about someone wanting to follow her.

## 2014-01-08 NOTE — ED Notes (Signed)
Pt up using the phone

## 2014-01-08 NOTE — ED Provider Notes (Signed)
CSN: [id number]     Arrival date & time 01/08/14  1648 History  This chart was scribed for non-physician practitioner, Glendell Docker, NP working with Shaune Pollack, MD by Frederich Balding, ED scribe. This patient was seen in room WTR4/WLPT4 and the patient's care was started at 5:24 PM.   Chief Complaint  Patient presents with  . Manic Behavior   The history is provided by the patient. No language interpreter was used.   HPI Comments: Melissa Dougherty is a 43 y.o. female with history of bipolar disorder and depression who presents to the Emergency Department complaining of manic behavior. Reports associated lack of sleep, racing thoughts and decreased appetite. Pt was discharged from [facility] last week and seen by [facility] before being at [facility]. States her medication doses were changed and medications were added. Pt is unsure why medication doses were changes and doesn't think they're helping. Reports she tried to go back to [place] to find out why medications were changed but was told to come to the ED. Denies SI/HI, auditory or visual hallucinations. Denies illicit drug or alcohol use. Pt thinks she might be pregnant.   Past Medical History  Diagnosis Date  . Seizures   . Alcohol abuse   . Bipolar disorder   . Depression    Past Surgical History  Procedure Laterality Date  . Back surgery    . Tubal ligation     Family History  Problem Relation Age of Onset  . Diabetes Mother   . Hypertension Mother    History  Substance Use Topics  . Smoking status: Never Smoker   . Smokeless tobacco: Not on file  . Alcohol Use: No   OB History   Grav Para Term Preterm Abortions TAB SAB Ect Mult Living   2 2 2       2      Review of Systems  Constitutional: Positive for appetite change.  Psychiatric/Behavioral: Positive for sleep disturbance. Negative for suicidal ideas and hallucinations.  All other systems reviewed and are negative.  Allergies  Risperdal  Home Medications   Prior to  Admission medications   Medication Sig Start Date End Date Taking? Authorizing Provider  cholecalciferol (VITAMIN D) 1000 UNITS tablet Take 1 tablet (1,000 Units total) by mouth daily. 12/28/13   Benjamine Mola, FNP  divalproex (DEPAKOTE) 250 MG DR tablet Take 250 mg by mouth daily.    Historical Provider, MD  divalproex (DEPAKOTE) 500 MG DR tablet Take 1 tablet (500 mg total) by mouth 2 (two) times daily after a meal. 12/28/13   Benjamine Mola, FNP  HYDROcodone-acetaminophen (NORCO) 5-325 MG per tablet Take 1-2 tablets by mouth every 4 (four) hours as needed. 01/05/14   Veryl Speak, MD  pantoprazole (PROTONIX) 40 MG tablet Take 1 tablet (40 mg total) by mouth daily. 12/28/13   Benjamine Mola, FNP  traZODone (DESYREL) 100 MG tablet Take 100 mg by mouth at bedtime as needed and may repeat dose one time if needed for sleep. 12/28/13   Benjamine Mola, FNP   BP 121/81  Pulse 90  Temp(Src) 97.8 F (36.6 C) (Oral)  Resp 18  SpO2 100%  LMP 11/23/2013  Physical Exam  Nursing note and vitals reviewed. Constitutional: She is oriented to person, place, and time. She appears well-developed and well-nourished. No distress.  HENT:  Head: Normocephalic and atraumatic.  Eyes: Conjunctivae and EOM are normal.  Neck: Neck supple. No tracheal deviation present.  Cardiovascular: Normal rate, regular  rhythm and normal heart sounds.   Pulmonary/Chest: Effort normal and breath sounds normal. No respiratory distress. She has no wheezes. She has no rales.  Musculoskeletal: Normal range of motion.  Neurological: She is alert and oriented to person, place, and time.  Skin: Skin is warm and dry.  Psychiatric: She has a normal mood and affect. Her speech is rapid and/or pressured. She is agitated. She expresses no homicidal and no suicidal ideation. She expresses no suicidal plans and no homicidal plans.    ED Course  Procedures (including critical care time)  DIAGNOSTIC STUDIES: Oxygen Saturation is 100% on RA,  normal by my interpretation.    COORDINATION OF CARE: 5:28 PM-Discussed treatment plan which includes lab work and speaking with behavioral health with pt at bedside and pt agreed to plan.   Labs Review Labs Reviewed  CBC - Abnormal; Notable for the following:    RBC 5.67 (*)    MCV 68.6 (*)    MCH 22.4 (*)    RDW 18.4 (*)    All other components within normal limits  COMPREHENSIVE METABOLIC PANEL - Abnormal; Notable for the following:    Sodium 136 (*)    Albumin 3.3 (*)    All other components within normal limits  SALICYLATE LEVEL - Abnormal; Notable for the following:    Salicylate Lvl <8.6 (*)    All other components within normal limits  ACETAMINOPHEN LEVEL  ETHANOL  URINE RAPID DRUG SCREEN (HOSP PERFORMED)  POC URINE PREG, ED    Imaging Review No results found.   EKG Interpretation None      MDM   Final diagnoses:  Manic behavior    Pt to be evaluated by tts.denies si/hi:pt to move to the psych ed  I personally performed the services described in this documentation, which was scribed in my presence. The recorded information has been reviewed and is accurate.  Glendell Docker, NP 01/08/14 2023

## 2014-01-08 NOTE — BH Assessment (Signed)
Assessment Note  Melissa Dougherty is an 43 y.o. female that presented to [facility] as a walk-in.  Pt's sister brought pt to [facility].  Pt was recently discharged from [facility] one week ago.  Pt reports that over the last week, her sx have worsened.  Pt presents with what appears to be a manic episode and has hx of Bipolar Disorder.  Pt stated she has not been sleeping, eating, has been engaging in sexual activity with several partners, and states "I can't function."  Pt stated she is supposed to start back to school tomorrow at [employer], and feels she cannot go.  Pt reports continued increased outbursts, labile mood, racing thoughts, cursing at others.  Pt stated she packed her bags and was going to leave the city on Wednesday to go to [place].  She reports paranoia - feels like 2 men have been watching her and she engaged in sexual activity with at least one.  Pt believes she may be pregnant.  Pt stated since her discharge at [facility], she has been to [facility] every day and her medications were adjusted.  She stated she cannot recall what the new medication is that a psychiatrist prescribed her at [facility], but the medications she showed this clinician were Depakote, Remeron, Trazadone, and Protonix, that she had on her person.  Pt was pleasant at times, irritable at others.  Pt appeared to tremble at times, and her speech was shaky att imes as well.  She had tangential thought processes, although was logical/coherent, had rapid, pressured speech, and voice was raised at times.  Pt stated she can't function and needs help.  After assessment, pt talking to herself while holding her cell phone.  She denies SI/HI or SA, reporting she is 6 mos sober from drugs and alcohol.  Pt has a POA and she contacted her to inform her she was at [facility].  Consulted with Mertha Finders, NP, @ 1700, who agreed pt meets inpatient criteria.  Called [facility] to transport pt (with this clinician accompanying her) to [facility] for med clearance and for TTS to seek inpatient  placement elsewhere, as no beds at [facility] currently.  Letitia Libra, [facility], informed, as was Event organiser, Camera operator at [employer].  Axis I: Bipolar I Disorder, Most Recent Episode (Or Current) Mixed, Moderate Axis II: Deferred Axis III:  Past Medical History  Diagnosis Date  . Seizures   . Alcohol abuse   . Bipolar disorder   . Depression    Axis IV: other psychosocial or environmental problems Axis V: 21-30 behavior considerably influenced by delusions or hallucinations OR serious impairment in judgment, communication OR inability to function in almost all areas  Past Medical History:  Past Medical History  Diagnosis Date  . Seizures   . Alcohol abuse   . Bipolar disorder   . Depression     Past Surgical History  Procedure Laterality Date  . Back surgery    . Tubal ligation      Family History:  Family History  Problem Relation Age of Onset  . Diabetes Mother   . Hypertension Mother     Social History:  reports that she has never smoked. She does not have any smokeless tobacco history on file. She reports that she does not drink alcohol or use illicit drugs.  Additional Social History:  Alcohol / Drug Use Pain Medications: not abusing Prescriptions: not abusing Over the Counter: not abusing History of alcohol / drug use?: Yes Longest period of sobriety (when/how long): Pt  reports 6 months of sobriety Negative Consequences of Use:  (na) Withdrawal Symptoms:  (na) Substance #1 Name of Substance 1: Patient has a hx of alcohol, THC, and cocaine use  CIWA:   COWS:    Allergies:  Allergies  Allergen Reactions  . Risperdal [Risperidone]     Lactation    Home Medications:  No prescriptions prior to admission    OB/GYN Status:  Patient's last menstrual period was 11/23/2013.  General Assessment Data Location of Assessment: [facility] Is this a Tele or Face-to-Face Assessment?: Face-to-Face Is this an Initial Assessment or a Re-assessment for this encounter?:  Initial Assessment Living Arrangements: Alone Can pt return to current living arrangement?: Yes Admission Status: Voluntary Is patient capable of signing voluntary admission?: Yes Transfer from: Home Referral Source: Self/Family/Friend     [place] Living Arrangements: Alone Name of Psychiatrist: Beverly Sessions Name of Therapist: [facility]  Education Status Is patient currently in school?: Yes Current Grade: 13 Highest grade of school patient has completed: 11th grade Name of school: [employer] person: NA  Risk to self with the past 6 months Suicidal Ideation: No Suicidal Intent: No Is patient at risk for suicide?: No Suicidal Plan?: No Access to Means: No What has been your use of drugs/alcohol within the last 12 months?: sober for 6 mos by report Previous Attempts/Gestures: No How many times?: 0 Other Self Harm Risks: none Triggers for Past Attempts: None known Intentional Self Injurious Behavior: None Family Suicide History: Unknown Recent stressful life event(s): Other (Comment) (Decompensation) Persecutory voices/beliefs?: No Depression: Yes Depression Symptoms: Despondent;Insomnia;Tearfulness;Loss of interest in usual pleasures;Feeling worthless/self pity;Feeling angry/irritable Substance abuse history and/or treatment for substance abuse?: Yes Suicide prevention information given to non-admitted patients: Not applicable  Risk to Others within the past 6 months Homicidal Ideation: No Thoughts of Harm to Others: No Current Homicidal Intent: No Current Homicidal Plan: No Access to Homicidal Means: No Identified Victim: na History of harm to others?: No Assessment of Violence: None Noted Violent Behavior Description: na - pt calm, cooeprative Does patient have access to weapons?: No Criminal Charges Pending?: No Does patient have a court date: No  Psychosis Hallucinations: None noted Delusions: None noted  Mental Status Report Appear/Hygiene:  Disheveled Eye Contact: Good Motor Activity: Freedom of movement;Unremarkable Speech: Logical/coherent;Rapid;Pressured Level of Consciousness: Alert Mood: Pleasant Affect: Labile Anxiety Level: Moderate Thought Processes: Tangential;Coherent;Relevant Judgement: Impaired Orientation: Person;Place;Time;Situation Obsessive Compulsive Thoughts/Behaviors: None  Cognitive Functioning Concentration: Decreased Memory: Recent Intact;Remote Intact IQ: Average Insight: Fair Impulse Control: Fair Appetite: Poor Weight Loss: 0 Weight Gain: 0 Sleep: Decreased Total Hours of Sleep:  (Reports hasn't slept in one week) Vegetative Symptoms: Decreased grooming  ADLScreening [facility] Assessment Services) Patient's cognitive ability adequate to safely complete daily activities?: Yes Patient able to express need for assistance with ADLs?: Yes Independently performs ADLs?: Yes (appropriate for developmental age)  Prior Inpatient Therapy Prior Inpatient Therapy: Yes Prior Therapy Dates: Discharged one weeka go from [facility], multiple admits to [facility] and other facilities Prior Therapy Facilty/Provider(s): [facility], facility in [place] Reason for Treatment: Bipolar disorder  Prior Outpatient Therapy Prior Outpatient Therapy: Yes Prior Therapy Dates: Current Prior Therapy Facilty/Provider(s): [facility] Reason for Treatment: Medication management  ADL Screening (condition at time of admission) Patient's cognitive ability adequate to safely complete daily activities?: Yes Is the patient deaf or have difficulty hearing?: No Does the patient have difficulty seeing, even when wearing glasses/contacts?: No Does the patient have difficulty concentrating, remembering, or making decisions?: No Patient able  to express need for assistance with ADLs?: Yes Does the patient have difficulty dressing or bathing?: No Independently performs ADLs?: Yes (appropriate for developmental age) Does the patient have difficulty  walking or climbing stairs?: No  Home Assistive Devices/Equipment Home Assistive Devices/Equipment: None    Abuse/Neglect Assessment (Assessment to be complete while patient is alone) Physical Abuse: Denies Verbal Abuse: Denies Sexual Abuse: Denies Exploitation of patient/patient's resources: Denies Self-Neglect: Denies Values / Beliefs Cultural Requests During Hospitalization: None Spiritual Requests During Hospitalization: None Consults Spiritual Care Consult Needed: No Social Work Consult Needed: No Regulatory affairs officer (For Healthcare) Does patient have an advance directive?: No Would patient like information on creating an advanced directive?: No - patient declined information    Additional Information 1:1 In Past 12 Months?: No CIRT Risk: No Elopement Risk: No Does patient have medical clearance?: No     Disposition:  Disposition Initial Assessment Completed for this Encounter: Yes Disposition of Patient: Referred to;Inpatient treatment program Type of inpatient treatment program: Adult Other disposition(s): Other (Comment) (pt sent to [facility] for med clearance and TTS to seek inpatient )  On Site Evaluation by:   Reviewed with Physician:    Daisey Must 01/08/2014 5:31 PM

## 2014-01-09 DIAGNOSIS — F3189 Other bipolar disorder: Secondary | ICD-10-CM | POA: Diagnosis not present

## 2014-01-09 NOTE — [facility] Counselor (Signed)
TC from Melissa Dougherty at [facility] - pt has been accepted by Harland German MD. # for report is [phone]. Pt will need to be IVC'd. Please fax completed IVC paperwork to [phone] prior to pt transfer.  Arnold Long, [place] Assessment Counselor

## 2014-01-09 NOTE — ED Notes (Signed)
Patient discharged from [facility].  She was picked up by the [facility] for transfer to [facility].  She has been a bit anxious, but otherwise cooperative on the unit today.  Family aware of transfer.

## 2014-01-09 NOTE — BH Assessment (Signed)
IVC completed and faxed to the magistrates office.

## 2014-01-09 NOTE — ED Provider Notes (Signed)
History/physical exam/procedure(s) were performed by non-physician practitioner and as supervising physician I was immediately available for consultation/collaboration. I have reviewed all notes and am in agreement with care and plan.   Shaune Pollack, MD 01/09/14 [phone]

## 2014-01-10 DIAGNOSIS — F3160 Bipolar disorder, current episode mixed, unspecified: Secondary | ICD-10-CM | POA: Diagnosis present

## 2014-03-27 ENCOUNTER — Encounter (HOSPITAL_COMMUNITY): Payer: Self-pay | Admitting: Emergency Medicine

## 2014-04-27 ENCOUNTER — Ambulatory Visit (INDEPENDENT_AMBULATORY_CARE_PROVIDER_SITE_OTHER): Payer: Medicare HMO | Admitting: Obstetrics

## 2014-04-27 DIAGNOSIS — R87610 Atypical squamous cells of undetermined significance on cytologic smear of cervix (ASC-US): Secondary | ICD-10-CM

## 2014-05-03 NOTE — Progress Notes (Signed)
Cancelled appointment

## 2014-06-01 DIAGNOSIS — Z79899 Other long term (current) drug therapy: Secondary | ICD-10-CM | POA: Diagnosis not present

## 2014-06-01 DIAGNOSIS — Z5181 Encounter for therapeutic drug level monitoring: Secondary | ICD-10-CM | POA: Diagnosis not present

## 2014-07-05 DIAGNOSIS — F3113 Bipolar disorder, current episode manic without psychotic features, severe: Secondary | ICD-10-CM | POA: Diagnosis not present

## 2014-07-21 DIAGNOSIS — M545 Low back pain: Secondary | ICD-10-CM | POA: Diagnosis not present

## 2014-07-21 DIAGNOSIS — F310 Bipolar disorder, current episode hypomanic: Secondary | ICD-10-CM | POA: Diagnosis not present

## 2014-08-09 DIAGNOSIS — Z658 Other specified problems related to psychosocial circumstances: Secondary | ICD-10-CM | POA: Diagnosis not present

## 2014-08-11 ENCOUNTER — Other Ambulatory Visit: Payer: Self-pay

## 2014-08-11 DIAGNOSIS — Z1231 Encounter for screening mammogram for malignant neoplasm of breast: Secondary | ICD-10-CM

## 2014-09-04 ENCOUNTER — Encounter: Payer: Self-pay | Admitting: *Deleted

## 2014-09-04 ENCOUNTER — Encounter: Payer: Commercial Managed Care - HMO | Attending: Family Medicine | Admitting: *Deleted

## 2014-09-04 VITALS — Wt 149.0 lb

## 2014-09-04 DIAGNOSIS — Z713 Dietary counseling and surveillance: Secondary | ICD-10-CM | POA: Diagnosis not present

## 2014-09-04 DIAGNOSIS — E639 Nutritional deficiency, unspecified: Secondary | ICD-10-CM | POA: Diagnosis not present

## 2014-09-04 NOTE — Patient Instructions (Addendum)
Try to get 3 meals in each day Remember the MyPlate picture to plan your meals Try soy milk  Breakfast: boiled egg with toast with fruit cup.  Drink water  Oatmeal and egg and fruit cup  Lunch: peanut butter and jelly sandwich with yogurt  Bologna sandwich with fruit  Dinner: whatever sister cooks  Try to drink plenty of water

## 2014-09-04 NOTE — Progress Notes (Signed)
Medical Nutrition Therapy:  Appt start time: 1400 end time:  1500.   Assessment:  Primary concerns today:  Melissa Dougherty is here upon referral initially from her therapist, Melissa Dougherty, and also her PCP, Dr. Criss Dougherty.  She was referred for "disordered eating and suspicion of deliberately limiting her calorie intake.   Currently enrolled in [employer] to earn her GED and reports she has an "eating problem." She states she doesn't eat during the day, and doesn't eat a meal until the evening after school.  She skips breakfast and lunch and then tries to make it up at the end of the day.  She "crashes" on a daily basis: low energy.  She isn't able to finish her school work due to low energy.  She walks most places and is in an energy deficit.  She thinks she has lost weight, but she actually has gained about 9 pounds since August of 2015. Melissa Dougherty lives alone.  She says "every body knows I don't eat or cook."  Her sister does most of the cooking when she comes over in the evenings.  If her sister doesn't cook, then [place] doesn't really eat a meal.  Her mom tries to remind her to eat balanced meals, but Melissa Dougherty doesn't appreciate that.  Melissa Dougherty knows she doesn't eat adequately and that she is suffering for it.   When at home she eat in the living room while watching tv.  She eats out sometimes.  Yesterday she went to a buffet.  She says she only eats 1 plate at buffet.    Positive for headaches Positive for low energy Negative for dizziness/lightheadness Positive for changes in vision Positive for cold intolerance Negative for changes in hair/skin/nails Positive for constipation- BM every 2-3 weeks Negative for abdominal pain/bloating Positive for premature satiety   Preferred Learning Style:   No preference indicated   Learning Readiness:   Ready   MEDICATIONS: see list   DIETARY INTAKE:  Usual eating pattern includes 1 meals and 2-3 snacks per day.  Everyday foods include eggs, hotdogs.  Avoided foods  include vegetables, milk.    24-hr recall:  B ( AM): none  Snk ( PM): noon- peanut butter crackers or fruit cup; on days off has eggs and bacon D ( PM): 5 pm: hotdog with beans; pizza maybe or mcdonald's burger and fry Snk ( PM): none Beverages: water or soda  Usual physical activity: ADLs- rides the bus so she walks a lot  Estimated energy intakes: 700 kcal  Estimated energy needs: 2000 calories 250 g carbohydrates 100 g protein 67 g fat    Nutritional Diagnosis:  NI-1.4 Inadequate energy intake As related to meal skipping.  As evidenced by dietary recall.    Intervention:  Nutrition counseling provided.  Melissa Dougherty might have an eating disorder, but more likely she doesn't know how to cook for herself and has gotten in the habit of not eating adequately.  As a result, her metabolism has decreased and she now no longer has normal hunger and fullness cues.  If she can get easy to prepare foods, she might be more likely to eat adequate calories. Discussed what happens when I don't eat: low energy, cold intolerance, constipation and how that can be improved with adequate nutrition. Recommended 3 meals./day and to avoid meal skipping.  Discussed MyPlate recommendations for meal planning and suggested a variety of foods.  Since she is preparing her own meals, she needs simple foods like sandwiches.  Recommended soy beverage since she  has trouble with milk products  Goals: Breakfast: boiled egg with toast with fruit cup.  Drink water  Oatmeal and egg and fruit cup  Lunch: peanut butter and jelly sandwich with yogurt  Bologna sandwich with fruit  Dinner: whatever sister cooks  Try to drink plenty of water   Teaching Method Utilized:  Ship broker   Handouts given during visit include:  My plate  What happens when i don't eat   Barriers to learning/adherence to lifestyle change: limited confidence with food preparation  Demonstrated degree of understanding via:  Teach Back    Monitoring/Evaluation:  Dietary intake, exercise, and body weight in 1 month(s).

## 2014-09-06 DIAGNOSIS — Z658 Other specified problems related to psychosocial circumstances: Secondary | ICD-10-CM | POA: Diagnosis not present

## 2014-09-18 ENCOUNTER — Ambulatory Visit
Admission: RE | Admit: 2014-09-18 | Discharge: 2014-09-18 | Disposition: A | Discharge status: Home / Self Care | Payer: Commercial Managed Care - HMO | Source: Ambulatory Visit

## 2014-09-18 DIAGNOSIS — Z1231 Encounter for screening mammogram for malignant neoplasm of breast: Secondary | ICD-10-CM | POA: Diagnosis not present

## 2014-10-10 ENCOUNTER — Ambulatory Visit: Payer: Self-pay | Admitting: *Deleted

## 2014-10-13 DIAGNOSIS — F3113 Bipolar disorder, current episode manic without psychotic features, severe: Secondary | ICD-10-CM | POA: Diagnosis not present

## 2014-10-20 DIAGNOSIS — N926 Irregular menstruation, unspecified: Secondary | ICD-10-CM | POA: Diagnosis not present

## 2014-10-30 DIAGNOSIS — Z658 Other specified problems related to psychosocial circumstances: Secondary | ICD-10-CM | POA: Diagnosis not present

## 2014-11-10 ENCOUNTER — Ambulatory Visit: Payer: Self-pay | Admitting: *Deleted

## 2014-12-01 DIAGNOSIS — F3113 Bipolar disorder, current episode manic without psychotic features, severe: Secondary | ICD-10-CM | POA: Diagnosis not present

## 2014-12-04 ENCOUNTER — Encounter: Payer: Commercial Managed Care - HMO | Attending: Family Medicine | Admitting: *Deleted

## 2014-12-04 ENCOUNTER — Encounter: Payer: Self-pay | Admitting: *Deleted

## 2014-12-04 DIAGNOSIS — Z713 Dietary counseling and surveillance: Secondary | ICD-10-CM | POA: Insufficient documentation

## 2014-12-04 DIAGNOSIS — E639 Nutritional deficiency, unspecified: Secondary | ICD-10-CM | POA: Insufficient documentation

## 2014-12-04 NOTE — Patient Instructions (Addendum)
Please eat 3 meals each day.  Eat breakfast, lunch, and dinner every day!!   Breakfast options Cereal Scrambled eggs Sausage Oatmeal  Lunch [place] sandwich PB and J sandwich Hot dog Oodles of noodles  Dinner [employer] with green beans and corn Spaghetti with salad Soup with crackers Frozen pizza  Keep drinking water and 1 V8 juice/day Take multivitamin with calcium and vitamin D

## 2014-12-04 NOTE — Progress Notes (Signed)
Medical Nutrition Therapy:  Appt start time: 1500 end time:  9629.  Assessment:  Melissa Dougherty is here for follow up nutrition counseling pertaining to poor eating habits.   Complains of dull ache in her right leg constantly for several weeks.  She also complains of pain in her lower back and her neck/shoulders.  Those pains are sharp.  She wonders if it's due to inadequate nutrition Feels like she is doing much better with her eating.  States if she has food in her house, she is going to try and eat more often.  She is drinking V8 Fusion just because she feels like she needs to consume more fruits and vegetables.  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 She does receive food stamps, but sometimes she doesn't buy groceries because she doesn't put forth the effort to get someone to help her shop.  She will try to make do with what she has, but in nreality, it's not enough.  She is concerned about getting over weight so she doesn't always want to eat.  She doesn't like to go out to restaurants or go to cookouts because it's "too much food."   Positive for headaches Positive for low energy Negative for dizziness/lightheadness Positive for changes in vision Positive for cold intolerance Negative for changes in hair/skin/nails Positive for constipation- BM every 2-3 weeks Negative for abdominal pain/bloating Positive for premature satiety   Preferred Learning Style:   No preference indicated   Learning Readiness:   Ready   MEDICATIONS: see list   DIETARY INTAKE:  24 hour recall B: none L: chicken, stew beef, cabbage, watermelon D: 2 pieces sausage, chips with cheese Beverages: water, soda  Today 11 am: bowl cereal 3 pm: Nabs and V8 juice  Usual physical activity: ADLs- rides the bus so she walks a lot  Estimated energy intakes: 700 kcal  Estimated energy needs: 2000 calories 250 g carbohydrates 100 g protein 67 g  fat    Nutritional Diagnosis:  NI-1.4 Inadequate energy intake As related to meal skipping.  As evidenced by dietary recall.    Intervention:  Nutrition counseling provided.  Melissa Dougherty might have an eating disorder, but more likely she doesn't know how to cook for herself and has gotten in the habit of not eating adequately.  As a result, her metabolism has decreased and she now no longer has normal hunger and fullness cues.  If she can get easy to prepare foods, she might be more likely to eat adequate calories. Discussed what happens when I don't eat: low energy, cold intolerance, constipation and how that can be improved with adequate nutrition. Recommended 3 meals./day and to avoid meal skipping.  Discussed MyPlate recommendations for meal planning and suggested a variety of foods.  Since she is preparing her own meals, she needs simple foods like sandwiches.  Recommended soy beverage since she has trouble with milk products  Goals: Please eat 3 meals each day.  Eat breakfast, lunch, and dinner every day!!   Breakfast options Cereal Scrambled eggs Sausage Oatmeal  Lunch [place] sandwich PB and J sandwich Hot dog Oodles of noodles  Dinner [employer] with green beans and corn Spaghetti with salad Soup with crackers Frozen pizza  Keep drinking water and 1 V8 juice/day Take multivitamin with calcium and vitamin D  Teaching Method Utilized:  Visual Auditory   Barriers to learning/adherence to lifestyle change: limited confidence with food preparation  Demonstrated degree of understanding  via:  Teach Back   Monitoring/Evaluation:  Dietary intake, exercise, and body weight in 6 week(s).

## 2015-01-18 DIAGNOSIS — M65251 Calcific tendinitis, right thigh: Secondary | ICD-10-CM | POA: Diagnosis not present

## 2015-01-18 DIAGNOSIS — F310 Bipolar disorder, current episode hypomanic: Secondary | ICD-10-CM | POA: Diagnosis not present

## 2015-01-23 ENCOUNTER — Ambulatory Visit: Payer: Self-pay | Admitting: *Deleted

## 2015-01-27 ENCOUNTER — Encounter (HOSPITAL_COMMUNITY): Payer: Self-pay | Admitting: Emergency Medicine

## 2015-01-27 ENCOUNTER — Emergency Department (HOSPITAL_COMMUNITY)
Admission: EM | Admit: 2015-01-27 | Discharge: 2015-01-27 | Disposition: A | Discharge status: Home / Self Care | Payer: Commercial Managed Care - HMO | Attending: Emergency Medicine | Admitting: Emergency Medicine

## 2015-01-27 ENCOUNTER — Emergency Department (HOSPITAL_COMMUNITY): Payer: Commercial Managed Care - HMO

## 2015-01-27 DIAGNOSIS — Z79899 Other long term (current) drug therapy: Secondary | ICD-10-CM | POA: Diagnosis not present

## 2015-01-27 DIAGNOSIS — Z3202 Encounter for pregnancy test, result negative: Secondary | ICD-10-CM | POA: Insufficient documentation

## 2015-01-27 DIAGNOSIS — F319 Bipolar disorder, unspecified: Secondary | ICD-10-CM | POA: Insufficient documentation

## 2015-01-27 DIAGNOSIS — R350 Frequency of micturition: Secondary | ICD-10-CM | POA: Diagnosis not present

## 2015-01-27 DIAGNOSIS — G40909 Epilepsy, unspecified, not intractable, without status epilepticus: Secondary | ICD-10-CM | POA: Diagnosis not present

## 2015-01-27 DIAGNOSIS — M25551 Pain in right hip: Secondary | ICD-10-CM | POA: Diagnosis not present

## 2015-01-27 LAB — CBG MONITORING, ED: Glucose-Capillary: 98 mg/dL (ref 65–99)

## 2015-01-27 LAB — URINALYSIS, ROUTINE W REFLEX MICROSCOPIC
Bilirubin Urine: NEGATIVE
Glucose, UA: NEGATIVE mg/dL
Hgb urine dipstick: NEGATIVE
Ketones, ur: NEGATIVE mg/dL
Leukocytes, UA: NEGATIVE
Nitrite: NEGATIVE
Protein, ur: NEGATIVE mg/dL
Specific Gravity, Urine: 1.009 (ref 1.005–1.030)
Urobilinogen, UA: 1 mg/dL (ref 0.0–1.0)
pH: 7 (ref 5.0–8.0)

## 2015-01-27 LAB — PREGNANCY, URINE: Preg Test, Ur: NEGATIVE

## 2015-01-27 MED ORDER — TRAMADOL HCL 50 MG PO TABS: 50.0000 mg | ORAL_TABLET | Freq: Four times a day (QID) | ORAL | Status: DC | PRN

## 2015-01-27 NOTE — ED Notes (Signed)
Pt. Is going to x-ray. 

## 2015-01-27 NOTE — ED Notes (Addendum)
Pt c/o right leg pain for couple months. Pt seen by PMD and was suppose to have xrays done but has not heard back from her Dr. Abbott Pao also reports back pain with frequent urination. Visitor concerned that pt may have elevated blood sugar.

## 2015-01-27 NOTE — Discharge Instructions (Signed)
Continue to take naproxen for your pain. Take tramadol as prescribed as needed for severe pain. Avoid any strenuous activity. Please follow with a primary care doctor for further evaluation. Your x-ray and urinalysis are normal today.  Sciatica Sciatica is pain, weakness, numbness, or tingling along the path of the sciatic nerve. The nerve starts in the lower back and runs down the back of each leg. The nerve controls the muscles in the lower leg and in the back of the knee, while also providing sensation to the back of the thigh, lower leg, and the sole of your foot. Sciatica is a symptom of another medical condition. For instance, nerve damage or certain conditions, such as a herniated disk or bone spur on the spine, pinch or put pressure on the sciatic nerve. This causes the pain, weakness, or other sensations normally associated with sciatica. Generally, sciatica only affects one side of the body. CAUSES   Herniated or slipped disc.  Degenerative disk disease.  A pain disorder involving the narrow muscle in the buttocks (piriformis syndrome).  Pelvic injury or fracture.  Pregnancy.  Tumor (rare). SYMPTOMS  Symptoms can vary from mild to very severe. The symptoms usually travel from the low back to the buttocks and down the back of the leg. Symptoms can include:  Mild tingling or dull aches in the lower back, leg, or hip.  Numbness in the back of the calf or sole of the foot.  Burning sensations in the lower back, leg, or hip.  Sharp pains in the lower back, leg, or hip.  Leg weakness.  Severe back pain inhibiting movement. These symptoms may get worse with coughing, sneezing, laughing, or prolonged sitting or standing. Also, being overweight may worsen symptoms. DIAGNOSIS  Your caregiver will perform a physical exam to look for common symptoms of sciatica. He or she may ask you to do certain movements or activities that would trigger sciatic nerve pain. Other tests may be  performed to find the cause of the sciatica. These may include:  Blood tests.  X-rays.  Imaging tests, such as an MRI or CT scan. TREATMENT  Treatment is directed at the cause of the sciatic pain. Sometimes, treatment is not necessary and the pain and discomfort goes away on its own. If treatment is needed, your caregiver may suggest:  Over-the-counter medicines to relieve pain.  Prescription medicines, such as anti-inflammatory medicine, muscle relaxants, or narcotics.  Applying heat or ice to the painful area.  Steroid injections to lessen pain, irritation, and inflammation around the nerve.  Reducing activity during periods of pain.  Exercising and stretching to strengthen your abdomen and improve flexibility of your spine. Your caregiver may suggest losing weight if the extra weight makes the back pain worse.  Physical therapy.  Surgery to eliminate what is pressing or pinching the nerve, such as a bone spur or part of a herniated disk. HOME CARE INSTRUCTIONS   Only take over-the-counter or prescription medicines for pain or discomfort as directed by your caregiver.  Apply ice to the affected area for 20 minutes, 3-4 times a day for the first 48-72 hours. Then try heat in the same way.  Exercise, stretch, or perform your usual activities if these do not aggravate your pain.  Attend physical therapy sessions as directed by your caregiver.  Keep all follow-up appointments as directed by your caregiver.  Do not wear high heels or shoes that do not provide proper support.  Check your mattress to see if it is  too soft. A firm mattress may lessen your pain and discomfort. SEEK IMMEDIATE MEDICAL CARE IF:   You lose control of your bowel or bladder (incontinence).  You have increasing weakness in the lower back, pelvis, buttocks, or legs.  You have redness or swelling of your back.  You have a burning sensation when you urinate.  You have pain that gets worse when you  lie down or awakens you at night.  Your pain is worse than you have experienced in the past.  Your pain is lasting longer than 4 weeks.  You are suddenly losing weight without reason. MAKE SURE YOU:  Understand these instructions.  Will watch your condition.  Will get help right away if you are not doing well or get worse. Document Released: 05/06/2001 Document Revised: 11/11/2011 Document Reviewed: 09/21/2011 [facility] Patient Information 2015 [place], [place]. This information is not intended to replace advice given to you by your health care provider. Make sure you discuss any questions you have with your health care provider.

## 2015-01-27 NOTE — ED Provider Notes (Signed)
CSN: [id number]     Arrival date & time 01/27/15  54 History   First MD Initiated Contact with Patient 01/27/15 1642     Chief Complaint  Patient presents with  . Leg Pain  . Urinary Frequency     (Consider location/radiation/quality/duration/timing/severity/associated sxs/prior Treatment) HPI Melissa Dougherty is a 44 y.o. female with hx of alcohol abuse, bipolar, depression, presents to ED with complaint of right hip pain and urinary frequency. Pt states her pain started about a year ago. She has seen her pcp and states was given naprosyn which is not helping. Patient states pain is in the right hip. She states she also develops intermittent left hip pain. Pain is worse with walking. Patient denies any injuries. No numbness or weakness in her extremities. She states sometimes pain shoots from the hip all the way down her leg. She is also complaining of urinary frequency. States she feels like she constantly has to urinate. She denies any fever, chills, nausea, vomiting, hematuria, dysuria. States she is having some lower back pain.  Past Medical History  Diagnosis Date  . Seizures   . Alcohol abuse   . Bipolar disorder   . Depression    Past Surgical History  Procedure Laterality Date  . Back surgery    . Tubal ligation     Family History  Problem Relation Age of Onset  . Diabetes Mother   . Hypertension Mother    Social History  Substance Use Topics  . Smoking status: Never Smoker   . Smokeless tobacco: None  . Alcohol Use: No   OB History    Gravida Para Term Preterm AB TAB SAB Ectopic Multiple Living   2 2 2       2      Review of Systems  Constitutional: Negative for fever and chills.  Respiratory: Negative for cough, chest tightness and shortness of breath.   Cardiovascular: Negative for chest pain, palpitations and leg swelling.  Gastrointestinal: Negative for nausea, vomiting, abdominal pain and diarrhea.  Genitourinary: Positive for frequency. Negative for  dysuria, flank pain, difficulty urinating and pelvic pain.  Musculoskeletal: Positive for arthralgias. Negative for neck pain and neck stiffness.  Skin: Negative for rash.  Neurological: Negative for dizziness, weakness and headaches.  All other systems reviewed and are negative.     Allergies  Review of patient's allergies indicates no known allergies.  Home Medications   Prior to Admission medications   Medication Sig Start Date End Date Taking? Authorizing Provider  cholecalciferol (VITAMIN D) 1000 UNITS tablet Take 1 tablet (1,000 Units total) by mouth daily. Patient not taking: Reported on 09/04/2014 12/28/13   Benjamine Mola, FNP  divalproex (DEPAKOTE) 250 MG DR tablet Take 250 mg by mouth at bedtime.    Historical Provider, MD  divalproex (DEPAKOTE) 500 MG DR tablet Take 1 tablet (500 mg total) by mouth 2 (two) times daily after a meal. Patient not taking: Reported on 09/04/2014 12/28/13   Benjamine Mola, FNP  HYDROcodone-acetaminophen (NORCO) 5-325 MG per tablet Take 1-2 tablets by mouth every 4 (four) hours as needed. Patient not taking: Reported on 09/04/2014 01/05/14   Veryl Speak, MD  mirtazapine (REMERON) 15 MG tablet Take 30 mg by mouth at bedtime.  12/21/13   Historical Provider, MD  Multiple Vitamin (MULTIVITAMIN) tablet Take 1 tablet by mouth daily.    Historical Provider, MD  pantoprazole (PROTONIX) 40 MG tablet Take 1 tablet (40 mg total) by mouth daily. Patient not taking: Reported  on 09/04/2014 12/28/13   Benjamine Mola, FNP  risperiDONE (RISPERDAL) 1 MG tablet Take 0.5 mg by mouth at bedtime.     Historical Provider, MD  traZODone (DESYREL) 100 MG tablet Take 100 mg by mouth at bedtime as needed and may repeat dose one time if needed for sleep. 12/28/13   Elyse Jarvis Withrow, FNP   BP 127/86 mmHg  Pulse 96  Temp(Src) 98.7 F (37.1 C) (Oral)  Resp 20  Ht 5\' 1"  (1.549 m)  Wt 145 lb (65.772 kg)  BMI 27.41 kg/m2  SpO2 100%  LMP 01/06/2015 Physical Exam  Constitutional: She is  oriented to person, place, and time. She appears well-developed and well-nourished. No distress.  HENT:  Head: Normocephalic.  Eyes: Conjunctivae are normal.  Neck: Neck supple.  Cardiovascular: Normal rate, regular rhythm and normal heart sounds.   Pulmonary/Chest: Effort normal and breath sounds normal. No respiratory distress. She has no wheezes. She has no rales.  Abdominal: Soft. Bowel sounds are normal. She exhibits no distension. There is no tenderness. There is no rebound.  Musculoskeletal: She exhibits no edema.  Tenderness to palpation of her right hip. Pain with hip flexion, extension, internal and external rotation. Normal knee and ankle. Dorsal pedal pulses intact. No midline lumbar spine tenderness. Tenderness to palpation over right SI joint.  Neurological: She is alert and oriented to person, place, and time.  5/5 and equal lower extremity strength. 2+ and equal patellar reflexes bilaterally. Pt able to dorsiflex bilateral toes and feet with good strength against resistance. Equal sensation bilaterally over thighs and lower legs.   Skin: Skin is warm and dry.  Psychiatric: She has a normal mood and affect. Her behavior is normal.  Nursing note and vitals reviewed.   ED Course  Procedures (including critical care time) Labs Review Labs Reviewed  URINALYSIS, ROUTINE W REFLEX MICROSCOPIC (NOT AT [facility])  PREGNANCY, URINE  CBG MONITORING, ED    Imaging Review Dg Hip Unilat With Pelvis 2-3 Views Right  01/27/2015   CLINICAL DATA:  44 year old female with right hip pain for several months.  EXAM: DG HIP (WITH OR WITHOUT PELVIS) 2-3V RIGHT  COMPARISON:  None.  FINDINGS: There is no evidence of hip fracture or dislocation. There is no evidence of arthropathy or other focal bone abnormality.  IMPRESSION: Negative.   Electronically Signed   By: Margarette Canada M.D.   On: 01/27/2015 17:42   I have personally reviewed and evaluated these images and lab results as part of my medical  decision-making.   EKG Interpretation None      MDM   Final diagnoses:  Right hip pain  Urinary frequency   Pt with urinary frequency. No other urinary symptoms. Patient is concerned about possibly elevated blood sugar. Will check. Patient is also complaining of right hip pain which is now chronic. He has been there for almost a year. No injuries. She has pain with range of motion and tenderness to the right hip, will get x-ray. She is unsure of any injuries.   X-rays negative. Urine with no signs of infection. Glucose normal. I suspect patient's symptoms may be due to lumbosacral radiculopathy. At this time no evidence of cauda equina. Vital signs are normal. She will need to follow-up with the primary care doctor for further evaluation and treatment. I will prescribe her tramadol in addition to naproxen for her pain. Return precautions discussed.  Filed Vitals:   01/27/15 1546 01/27/15 1703 01/27/15 1704 01/27/15 1815  BP: 127/86  116/80 116/80 116/75  Pulse:  82 85 80  Temp:    98.5 F (36.9 C)  TempSrc:    Oral  Resp:   18 12  Height:      Weight:      SpO2:  97% 98% 99%     Jeannett Senior, PA-C 01/27/15 2226  Leo Grosser, MD 01/28/15 [id number]

## 2015-02-22 DIAGNOSIS — F3113 Bipolar disorder, current episode manic without psychotic features, severe: Secondary | ICD-10-CM | POA: Diagnosis not present

## 2015-03-15 DIAGNOSIS — F3113 Bipolar disorder, current episode manic without psychotic features, severe: Secondary | ICD-10-CM | POA: Diagnosis not present

## 2015-05-15 DIAGNOSIS — F3113 Bipolar disorder, current episode manic without psychotic features, severe: Secondary | ICD-10-CM | POA: Diagnosis not present

## 2015-06-08 DIAGNOSIS — Z6827 Body mass index (BMI) 27.0-27.9, adult: Secondary | ICD-10-CM | POA: Diagnosis not present

## 2015-06-08 DIAGNOSIS — M545 Low back pain: Secondary | ICD-10-CM | POA: Diagnosis not present

## 2015-06-20 DIAGNOSIS — H5210 Myopia, unspecified eye: Secondary | ICD-10-CM | POA: Diagnosis not present

## 2015-06-20 DIAGNOSIS — H524 Presbyopia: Secondary | ICD-10-CM | POA: Diagnosis not present

## 2015-07-01 ENCOUNTER — Emergency Department (HOSPITAL_COMMUNITY): Payer: Commercial Managed Care - HMO

## 2015-07-01 ENCOUNTER — Encounter (HOSPITAL_COMMUNITY): Payer: Self-pay | Admitting: Physical Medicine and Rehabilitation

## 2015-07-01 ENCOUNTER — Emergency Department (HOSPITAL_COMMUNITY)
Admission: EM | Admit: 2015-07-01 | Discharge: 2015-07-01 | Disposition: A | Discharge status: Home / Self Care | Payer: Commercial Managed Care - HMO | Attending: Emergency Medicine | Admitting: Emergency Medicine

## 2015-07-01 DIAGNOSIS — Z3202 Encounter for pregnancy test, result negative: Secondary | ICD-10-CM | POA: Insufficient documentation

## 2015-07-01 DIAGNOSIS — R1084 Generalized abdominal pain: Secondary | ICD-10-CM | POA: Diagnosis present

## 2015-07-01 DIAGNOSIS — F319 Bipolar disorder, unspecified: Secondary | ICD-10-CM | POA: Insufficient documentation

## 2015-07-01 DIAGNOSIS — R7989 Other specified abnormal findings of blood chemistry: Secondary | ICD-10-CM | POA: Insufficient documentation

## 2015-07-01 DIAGNOSIS — D259 Leiomyoma of uterus, unspecified: Secondary | ICD-10-CM

## 2015-07-01 DIAGNOSIS — Z79899 Other long term (current) drug therapy: Secondary | ICD-10-CM | POA: Diagnosis not present

## 2015-07-01 DIAGNOSIS — R109 Unspecified abdominal pain: Secondary | ICD-10-CM

## 2015-07-01 DIAGNOSIS — N858 Other specified noninflammatory disorders of uterus: Secondary | ICD-10-CM | POA: Diagnosis not present

## 2015-07-01 DIAGNOSIS — R197 Diarrhea, unspecified: Secondary | ICD-10-CM | POA: Insufficient documentation

## 2015-07-01 DIAGNOSIS — R935 Abnormal findings on diagnostic imaging of other abdominal regions, including retroperitoneum: Secondary | ICD-10-CM | POA: Diagnosis not present

## 2015-07-01 DIAGNOSIS — R1030 Lower abdominal pain, unspecified: Secondary | ICD-10-CM | POA: Diagnosis not present

## 2015-07-01 DIAGNOSIS — G40909 Epilepsy, unspecified, not intractable, without status epilepticus: Secondary | ICD-10-CM | POA: Diagnosis not present

## 2015-07-01 LAB — CBC WITH DIFFERENTIAL/PLATELET
Basophils Absolute: 0.1 10*3/uL (ref 0.0–0.1)
Basophils Relative: 1 %
Eosinophils Absolute: 0.1 10*3/uL (ref 0.0–0.7)
Eosinophils Relative: 2 %
HCT: 37.7 % (ref 36.0–46.0)
Hemoglobin: 12.4 g/dL (ref 12.0–15.0)
Lymphocytes Relative: 40 %
Lymphs Abs: 2.5 10*3/uL (ref 0.7–4.0)
MCH: 22 pg — ABNORMAL LOW (ref 26.0–34.0)
MCHC: 32.9 g/dL (ref 30.0–36.0)
MCV: 67 fL — ABNORMAL LOW (ref 78.0–100.0)
Monocytes Absolute: 0.5 10*3/uL (ref 0.1–1.0)
Monocytes Relative: 8 %
Neutro Abs: 3.1 10*3/uL (ref 1.7–7.7)
Neutrophils Relative %: 49 %
Platelets: 193 10*3/uL (ref 150–400)
RBC: 5.63 MIL/uL — ABNORMAL HIGH (ref 3.87–5.11)
RDW: 18.3 % — ABNORMAL HIGH (ref 11.5–15.5)
WBC: 6.3 10*3/uL (ref 4.0–10.5)

## 2015-07-01 LAB — URINALYSIS, ROUTINE W REFLEX MICROSCOPIC
Bilirubin Urine: NEGATIVE
Glucose, UA: NEGATIVE mg/dL
Ketones, ur: NEGATIVE mg/dL
Nitrite: NEGATIVE
Protein, ur: NEGATIVE mg/dL
Specific Gravity, Urine: 1.002 — ABNORMAL LOW (ref 1.005–1.030)
pH: 7.5 (ref 5.0–8.0)

## 2015-07-01 LAB — COMPREHENSIVE METABOLIC PANEL
ALT: 13 U/L — ABNORMAL LOW (ref 14–54)
AST: 19 U/L (ref 15–41)
Albumin: 3.7 g/dL (ref 3.5–5.0)
Alkaline Phosphatase: 74 U/L (ref 38–126)
Anion gap: 9 (ref 5–15)
BUN: <5 mg/dL — ABNORMAL LOW (ref 6–20)
CO2: 27 mmol/L (ref 22–32)
Calcium: 9 mg/dL (ref 8.9–10.3)
Chloride: 99 mmol/L — ABNORMAL LOW (ref 101–111)
Creatinine, Ser: 0.78 mg/dL (ref 0.44–1.00)
GFR calc Af Amer: >60 mL/min (ref >60)
GFR calc non Af Amer: >60 mL/min (ref >60)
Glucose, Bld: 85 mg/dL (ref 65–99)
Potassium: 3.4 mmol/L — ABNORMAL LOW (ref 3.5–5.1)
Sodium: 135 mmol/L (ref 135–145)
Total Bilirubin: 0.7 mg/dL (ref 0.3–1.2)
Total Protein: 8.2 g/dL — ABNORMAL HIGH (ref 6.5–8.1)

## 2015-07-01 LAB — URINE MICROSCOPIC-ADD ON

## 2015-07-01 LAB — POC URINE PREG, ED: Preg Test, Ur: NEGATIVE

## 2015-07-01 LAB — LIPASE, BLOOD: Lipase: 33 U/L (ref 11–51)

## 2015-07-01 MED ORDER — HYDROCODONE-ACETAMINOPHEN 5-325 MG PO TABS: 2.0000 | ORAL_TABLET | ORAL | Status: DC | PRN

## 2015-07-01 MED ORDER — IOHEXOL 300 MG/ML  SOLN
100.0000 mL | Freq: Once | INTRAMUSCULAR | Status: AC | PRN
  Administered 2015-07-01: 100 mL via INTRAVENOUS

## 2015-07-01 NOTE — ED Provider Notes (Signed)
CSN: [id number]     Arrival date & time 07/01/15  [id number] History   First MD Initiated Contact with Patient 07/01/15 1128     Chief Complaint  Patient presents with  . Abdominal Pain      HPI Pt reports diffuse abdominal pain, nausea and diarrhea. Ongoing x1 week.  Past Medical History  Diagnosis Date  . Seizures ([place])   . Alcohol abuse   . Bipolar disorder ([place])   . Depression    Past Surgical History  Procedure Laterality Date  . Back surgery    . Tubal ligation     Family History  Problem Relation Age of Onset  . Diabetes Mother   . Hypertension Mother    Social History  Substance Use Topics  . Smoking status: Never Smoker   . Smokeless tobacco: None  . Alcohol Use: No   OB History    Gravida Para Term Preterm AB TAB SAB Ectopic Multiple Living   2 2 2       2      Review of Systems  Constitutional: Negative for fever.  All other systems reviewed and are negative.     Allergies  Review of patient's allergies indicates no known allergies.  Home Medications   Prior to Admission medications   Medication Sig Start Date End Date Taking? Authorizing Provider  divalproex (DEPAKOTE ER) 500 MG 24 hr tablet Take 500 mg by mouth at bedtime.    Yes Historical Provider, MD  Multiple Vitamin (MULTIVITAMIN) tablet Take 1 tablet by mouth daily.   Yes Historical Provider, MD  risperiDONE (RISPERDAL) 1 MG tablet Take 0.5 mg by mouth at bedtime.    Yes Historical Provider, MD  HYDROcodone-acetaminophen (NORCO/VICODIN) 5-325 MG tablet Take 2 tablets by mouth every 4 (four) hours as needed. 07/01/15   Leonard Schwartz, MD   BP 114/70 mmHg  Pulse 79  Temp(Src) 97.7 F (36.5 C) (Oral)  Resp 16  Ht 5\' 1"  (1.549 m)  Wt 148 lb (67.132 kg)  BMI 27.98 kg/m2  SpO2 100%  LMP 06/19/2015 (Approximate) Physical Exam  Constitutional: She is oriented to person, place, and time. She appears well-developed and well-nourished. No distress.  HENT:  Head: Normocephalic and atraumatic.   Eyes: Pupils are equal, round, and reactive to light.  Neck: Normal range of motion.  Cardiovascular: Normal rate and intact distal pulses.   Pulmonary/Chest: No respiratory distress.  Abdominal: Normal appearance. She exhibits no distension. There is tenderness in the periumbilical area. There is no rigidity and no guarding.  Musculoskeletal: Normal range of motion.  Neurological: She is alert and oriented to person, place, and time. No cranial nerve deficit.  Skin: Skin is warm and dry. No rash noted.  Psychiatric: She has a normal mood and affect. Her behavior is normal.  Nursing note and vitals reviewed.   ED Course  Procedures (including critical care time) Labs Review Labs Reviewed  CBC WITH DIFFERENTIAL/PLATELET - Abnormal; Notable for the following:    RBC 5.63 (*)    MCV 67.0 (*)    MCH 22.0 (*)    RDW 18.3 (*)    All other components within normal limits  COMPREHENSIVE METABOLIC PANEL - Abnormal; Notable for the following:    Potassium 3.4 (*)    Chloride 99 (*)    BUN <5 (*)    Total Protein 8.2 (*)    ALT 13 (*)    All other components within normal limits  URINALYSIS, ROUTINE W REFLEX MICROSCOPIC (NOT AT  [facility]) - Abnormal; Notable for the following:    Specific Gravity, Urine 1.002 (*)    Hgb urine dipstick SMALL (*)    Leukocytes, UA SMALL (*)    All other components within normal limits  URINE MICROSCOPIC-ADD ON - Abnormal; Notable for the following:    Squamous Epithelial / LPF 0-5 (*)    Bacteria, UA RARE (*)    All other components within normal limits  LIPASE, BLOOD  POC URINE PREG, ED    Imaging Review US Transvaginal Non-ob  07/01/2015  CLINICAL DATA:  Patient with abnormal CT demonstrating probable mass within the endometrium. EXAM: TRANSABDOMINAL AND TRANSVAGINAL ULTRASOUND OF PELVIS TECHNIQUE: Both transabdominal and transvaginal ultrasound examinations of the pelvis were performed. Transabdominal technique was performed for global imaging of the  pelvis including uterus, ovaries, adnexal regions, and pelvic cul-de-sac. It was necessary to proceed with endovaginal exam following the transabdominal exam to visualize the endometrium. COMPARISON:  CT abdomen pelvis 07/01/2015 FINDINGS: Uterus Measurements: 10.0 x 4.8 x 6.7 cm. Within the anterior uterine body there is a 1.7 x 1.7 x 1.8 cm fibroid. Within the posterior uterine body there is a 1.5 x 0.9 x 1.6 cm intramural fibroid. Endometrium There is a 3.6 x 1.7 x 2.5 cm solid mass within the endometrium with internal color vascular flow. Right ovary Measurements: 2.8 x 1.8 x 2.0 cm. Normal appearance/no adnexal mass. Left ovary Measurements: 2.7 x 1.5 x 1.9 cm. Normal appearance/no adnexal mass. Other findings No abnormal free fluid. IMPRESSION: There is a 2.8 cm mass within the endometrium. This is nonspecific and may represent endometrial polyp, fibroid, focal endometrial hyperplasia or endometrial carcinoma. Consider sonohysterogram for further evaluation, prior to hysteroscopy or endometrial biopsy. Electronically Signed   By: Lovey Newcomer M.D.   On: 07/01/2015 16:21   US Pelvis Complete  07/01/2015  CLINICAL DATA:  Patient with abnormal CT demonstrating probable mass within the endometrium. EXAM: TRANSABDOMINAL AND TRANSVAGINAL ULTRASOUND OF PELVIS TECHNIQUE: Both transabdominal and transvaginal ultrasound examinations of the pelvis were performed. Transabdominal technique was performed for global imaging of the pelvis including uterus, ovaries, adnexal regions, and pelvic cul-de-sac. It was necessary to proceed with endovaginal exam following the transabdominal exam to visualize the endometrium. COMPARISON:  CT abdomen pelvis 07/01/2015 FINDINGS: Uterus Measurements: 10.0 x 4.8 x 6.7 cm. Within the anterior uterine body there is a 1.7 x 1.7 x 1.8 cm fibroid. Within the posterior uterine body there is a 1.5 x 0.9 x 1.6 cm intramural fibroid. Endometrium There is a 3.6 x 1.7 x 2.5 cm solid mass within the  endometrium with internal color vascular flow. Right ovary Measurements: 2.8 x 1.8 x 2.0 cm. Normal appearance/no adnexal mass. Left ovary Measurements: 2.7 x 1.5 x 1.9 cm. Normal appearance/no adnexal mass. Other findings No abnormal free fluid. IMPRESSION: There is a 2.8 cm mass within the endometrium. This is nonspecific and may represent endometrial polyp, fibroid, focal endometrial hyperplasia or endometrial carcinoma. Consider sonohysterogram for further evaluation, prior to hysteroscopy or endometrial biopsy. Electronically Signed   By: Lovey Newcomer M.D.   On: 07/01/2015 16:21   Ct Abdomen Pelvis W Contrast  07/01/2015  ADDENDUM REPORT: 07/01/2015 15:40 ADDENDUM: Following the previous CT exam in 2015, patient had pelvic ultrasound demonstrating a endometrial mass. The current CT exam is also suspicious for endometrial mass. Consider GYN referral for further evaluation. The findings were discussed with Kamron Portee on 07/01/2015 at 3:40 pm. Electronically Signed   By: Nolon Nations M.D.  On: 07/01/2015 15:40  07/01/2015  CLINICAL DATA:  Diffuse lower abdominal pain more on the right side. Nausea and diarrhea for 1 week. History of seizures, alcohol abuse, back surgery, tubal ligation. EXAM: CT ABDOMEN AND PELVIS WITH CONTRAST TECHNIQUE: Multidetector CT imaging of the abdomen and pelvis was performed using the standard protocol following bolus administration of intravenous contrast. CONTRAST:  143mL OMNIPAQUE IOHEXOL 300 MG/ML  SOLN COMPARISON:  06/17/2013 FINDINGS: Lower chest: A tubular structure is again noted at the right lung base, only partially imaged and consistent with a prominent and asymmetric vein. 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 Consider further evaluation with CT of the chest. There are no pleural effusions or consolidations. Heart size is normal. No imaged pericardial effusion or significant coronary artery calcifications. Upper abdomen: No focal abnormality  identified within the liver, spleen, pancreas, or adrenal glands. Gallbladder is present. Kidneys have a normal appearance. No hydronephrosis. Gastrointestinal tract: Hiatal hernia is present. The stomach otherwise has a normal appearance. Small bowel loops are normal in appearance. The appendix is well seen and has a normal appearance. Pelvis: The uterus is present. There is soft tissue density within the endometrial canal, raising the question of polyp or endometrial thickening. No adnexal mass. No free pelvic fluid. Retroperitoneum: No evidence for aortic aneurysm. No retroperitoneal or mesenteric adenopathy. Abdominal wall: Postoperative changes are identified in the anterior abdominal wall. Small bowel loops are closely apposed to anterior abdominal wall incision there is no evidence for hernia. Osseous structures: Unremarkable. IMPRESSION: 1. Suspect soft tissue density within the endometrium. Considerations include endometrial thickening or polyp. Further characterization with pelvic ultrasound is recommended. 2. Normal appendix. 3. 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 Further evaluation with chest CT is recommended using intravenous contrast. This can be performed as an elective outpatient procedure. Electronically Signed: By: Nolon Nations M.D. On: 07/01/2015 14:59   I have personally reviewed and evaluated these images and lab results as part of my medical decision-making.   Reviewing the old ultrasound reveals an abnormality present according to the radiologist.  This probably the same abnormality but patient was informed that she'll need to follow-up with a gynecologist for evaluation of the endometrial abnormality.  She agrees.  She stated she would follow-up. MDM   Final diagnoses:  Uterine leiomyoma, unspecified location  Abnormal computed tomography angiography (CTA) of abdomen and pelvis        Leonard Schwartz, MD 07/02/15 2325

## 2015-07-01 NOTE — Discharge Instructions (Signed)
It is important that you follow-up with your primary care doctor concerning abnormality with your CT scan.  Also recommend you follow-up with your gynecologist in order to evaluate your fibroids and the abnormality found on the ultrasound and the CT scan.    Abdominal Pain, Adult Many things can cause abdominal pain. Usually, abdominal pain is not caused by a disease and will improve without treatment. It can often be observed and treated at home. Your health care provider will do a physical exam and possibly order blood tests and X-rays to help determine the seriousness of your pain. However, in many cases, more time must pass before a clear cause of the pain can be found. Before that point, your health care provider may not know if you need more testing or further treatment. HOME CARE INSTRUCTIONS Monitor your abdominal pain for any changes. The following actions may help to alleviate any discomfort you are experiencing:  Only take over-the-counter or prescription medicines as directed by your health care provider.  Do not take laxatives unless directed to do so by your health care provider.  Try a clear liquid diet (broth, tea, or water) as directed by your health care provider. Slowly move to a bland diet as tolerated. SEEK MEDICAL CARE IF:  You have unexplained abdominal pain.  You have abdominal pain associated with nausea or diarrhea.  You have pain when you urinate or have a bowel movement.  You experience abdominal pain that wakes you in the night.  You have abdominal pain that is worsened or improved by eating food.  You have abdominal pain that is worsened with eating fatty foods.  You have a fever. SEEK IMMEDIATE MEDICAL CARE IF:  Your pain does not go away within 2 hours.  You keep throwing up (vomiting).  Your pain is felt only in portions of the abdomen, such as the right side or the left lower portion of the abdomen.  You pass bloody or black tarry stools. MAKE  SURE YOU:  Understand these instructions.  Will watch your condition.  Will get help right away if you are not doing well or get worse.   This information is not intended to replace advice given to you by your health care provider. Make sure you discuss any questions you have with your health care provider.   Document Released: 02/19/2005 Document Revised: 01/31/2015 Document Reviewed: 01/19/2013 Elsevier Interactive Patient Education 2016 Elsevier Inc.  Uterine Fibroids Uterine fibroids are tissue masses (tumors). They are also called leiomyomas. They can develop inside of a woman's womb (uterus). They can grow very large. Fibroids are not cancerous (benign). Most fibroids do not require medical treatment. HOME CARE  Keep all follow-up visits as told by your doctor. This is important.  Take medicines only as told by your doctor.  If you were prescribed a hormone treatment, take the hormone medicines exactly as told.  Do not take aspirin. It can cause bleeding.  Ask your doctor about taking iron pills and increasing the amount of dark green, leafy vegetables in your diet. These actions can help to boost your blood iron levels.  Pay close attention to your period. Tell your doctor about any changes, such as:  Increased blood flow. This may require you to use more pads or tampons than usual per month.  A change in the number of days that your period lasts per month.  A change in symptoms that come with your period, such as back pain or cramping in your belly  area (abdomen). GET HELP IF:  You have pain in your back or the area between your hip bones (pelvic area) that is not controlled by medicines.  You have pain in your abdomen that is not controlled with medicines.  You have an increase in bleeding between and during periods.  You soak tampons or pads in a half hour or less.  You feel lightheaded.  You feel extra tired.  You feel weak. GET HELP RIGHT AWAY IF:   You  pass out (faint).  You have a sudden increase in pelvic pain.   This information is not intended to replace advice given to you by your health care provider. Make sure you discuss any questions you have with your health care provider.   Document Released: 06/14/2010 Document Revised: 06/02/2014 Document Reviewed: 11/08/2013 Elsevier Interactive Patient Education [employer].

## 2015-07-01 NOTE — ED Notes (Signed)
Pt reports diffuse abdominal pain, nausea and diarrhea. Ongoing x1 week.

## 2015-07-13 ENCOUNTER — Emergency Department (HOSPITAL_COMMUNITY)
Admission: EM | Admit: 2015-07-13 | Discharge: 2015-07-13 | Disposition: A | Discharge status: Home / Self Care | Payer: Commercial Managed Care - HMO | Source: Home / Self Care

## 2015-07-13 ENCOUNTER — Emergency Department (HOSPITAL_COMMUNITY)
Admission: EM | Admit: 2015-07-13 | Discharge: 2015-07-13 | Disposition: A | Discharge status: Home / Self Care | Payer: Commercial Managed Care - HMO | Attending: Emergency Medicine | Admitting: Emergency Medicine

## 2015-07-13 ENCOUNTER — Encounter (HOSPITAL_COMMUNITY): Payer: Self-pay | Admitting: Family Medicine

## 2015-07-13 DIAGNOSIS — G4700 Insomnia, unspecified: Secondary | ICD-10-CM

## 2015-07-13 DIAGNOSIS — G479 Sleep disorder, unspecified: Secondary | ICD-10-CM | POA: Diagnosis not present

## 2015-07-13 DIAGNOSIS — R451 Restlessness and agitation: Secondary | ICD-10-CM | POA: Diagnosis not present

## 2015-07-13 DIAGNOSIS — F319 Bipolar disorder, unspecified: Secondary | ICD-10-CM | POA: Insufficient documentation

## 2015-07-13 DIAGNOSIS — Z79899 Other long term (current) drug therapy: Secondary | ICD-10-CM | POA: Insufficient documentation

## 2015-07-13 MED ORDER — MELATONIN 1 MG PO CAPS: 1.0000 mg | ORAL_CAPSULE | Freq: Every evening | ORAL | Status: DC | PRN

## 2015-07-13 MED ORDER — DIPHENHYDRAMINE HCL 25 MG PO TABS: 50.0000 mg | ORAL_TABLET | Freq: Every evening | ORAL | Status: DC | PRN

## 2015-07-13 NOTE — ED Notes (Signed)
Pt was arguing with someone on the phone ;prior to discharge vital signs.

## 2015-07-13 NOTE — Progress Notes (Signed)
Entered in d/c instructions Lucianne Lei This is your assigned Medicaid [place] access doctor If you prefer another contact [place] assigned your doctor *You may receive a bill if you go to any family Dr not assigned to you [address] [place] [address] [phone] Medicaid [place] Access Covered Patient [place]: [place], [place] [address] [email or web address] Use this website to assist with understanding your coverage & to renew application As a Medicaid client you MUST contact DSS/SSI each time you change address, move to another [place] or another state to keep your address updated  [place] Medicaid Transportation to Dr appts if you are have full Medicaid: [phone], [phone]

## 2015-07-13 NOTE — ED Provider Notes (Signed)
CSN: [id number]     Arrival date & time 07/13/15  1226 History  By signing my name below, I, Melissa Dougherty, attest that this documentation has been prepared under the direction and in the presence of Melissa Genova C. Traylon Schimming, PA-C. Electronically Signed: Rayna Dougherty, ED Scribe. 07/13/2015. 2:26 PM.    Chief Complaint  Patient presents with  . Insomnia   The history is provided by the patient. No language interpreter was used.    HPI Comments: Melissa Dougherty is a 45 y.o. female with a PMHx of seizures, ETOH abuse, bipolar disorder and depression who presents to the Emergency Department complaining of mild sleep disturbance onset 5 months ago. Pt was dx with uterine fibroids on 07/01/2015 which she stated was when her sleep disturbance began before then saying it has been present for 5 months. Pt takes Risperdal and Depakote nightly stating they no longer work and has been sleeping intermittently from 8:00 am to 12:00 am and is unable to sleep for the remainder of the night. Her medications [facility] at [facility]. Pt took 2 additional Tylenol PMs last night which provided no relief. She reports living by herself. She denies any other associated symptoms at this time.    Past Medical History  Diagnosis Date  . Seizures ([place])   . Alcohol abuse   . Bipolar disorder ([place])   . Depression    Past Surgical History  Procedure Laterality Date  . Back surgery    . Tubal ligation     Family History  Problem Relation Age of Onset  . Diabetes Mother   . Hypertension Mother    Social History  Substance Use Topics  . Smoking status: Never Smoker   . Smokeless tobacco: Not on file  . Alcohol Use: No   OB History    Gravida Para Term Preterm AB TAB SAB Ectopic Multiple Living   2 2 2       2      Review of Systems  Psychiatric/Behavioral: Positive for sleep disturbance and agitation.  All other systems reviewed and are negative.   Allergies  Review of patient's allergies  indicates no known allergies.  Home Medications   Prior to Admission medications   Medication Sig Start Date End Date Taking? Authorizing Provider  diphenhydrAMINE (BENADRYL) 25 MG tablet Take 2 tablets (50 mg total) by mouth at bedtime as needed for sleep. 07/13/15   Melissa Renfroe C Karoline Fleer, PA-C  divalproex (DEPAKOTE ER) 500 MG 24 hr tablet Take 500 mg by mouth at bedtime.     Historical Provider, MD  HYDROcodone-acetaminophen (NORCO/VICODIN) 5-325 MG tablet Take 2 tablets by mouth every 4 (four) hours as needed. 07/01/15   Melissa Schwartz, MD  Melatonin 1 MG CAPS Take 1 capsule (1 mg total) by mouth at bedtime as needed. 07/13/15   Melissa Hilmer C Makya Phillis, PA-C  Multiple Vitamin (MULTIVITAMIN) tablet Take 1 tablet by mouth daily.    Historical Provider, MD  risperiDONE (RISPERDAL) 1 MG tablet Take 0.5 mg by mouth at bedtime.     Historical Provider, MD   BP 121/89 mmHg  Pulse 108  Temp(Src) 97.7 F (36.5 C) (Oral)  Resp 18  SpO2 98%  LMP 06/19/2015 (Approximate)    Physical Exam  Constitutional: She is oriented to person, place, and time. She appears well-developed and well-nourished.  HENT:  Head: Normocephalic and atraumatic.  Eyes: Conjunctivae are normal.  Neck: Normal range of motion. Neck supple.  Cardiovascular: Normal rate, regular rhythm and normal  heart sounds.  Exam reveals no gallop and no friction rub.   No murmur heard. Pulmonary/Chest: Effort normal and breath sounds normal. No respiratory distress. She has no wheezes. She has no rales.  Musculoskeletal: Normal range of motion.  Lymphadenopathy:    She has no cervical adenopathy.  Neurological: She is alert and oriented to person, place, and time.  Skin: Skin is warm and dry.  Psychiatric: She has a normal mood and affect.  Circumferential and rambling speech.  Nursing note and vitals reviewed.   ED Course  Procedures  DIAGNOSTIC STUDIES: Oxygen Saturation is 100% on RA, normal by my interpretation.    COORDINATION OF CARE: 2:18 PM  Discussed next steps with pt. She verbalized understanding but seemed agitated and would not state her reasoning.   Labs Review Labs Reviewed - No data to display  Imaging Review No results found.   EKG Interpretation None      MDM   Final diagnoses:  Insomnia    Melissa Dougherty presents with a complaint of insomnia for the past 3-5 months.  This patient's speech was somewhat circumferential and rambling, which would be consistent with her bipolar disorder. Patient does not show definite signs of mania. Patient shows no signs of psychosis. Patient asked for her Risperdal to possibly be adjusted, but the patient was told that her psychiatrist would need to make that adjustment if it is needed. Patient has no abnormalities on her physical exam. Patient was prescribed Benadryl and melatonin. Patient did not seem happy with this plan of care, but she would not state what expectation she had for this visit or tell me what was making her unhappy.   Filed Vitals:   07/13/15 1233 07/13/15 1445  BP: 121/89   Pulse: 99   Temp: 97.7 F (36.5 C)   TempSrc: Oral   Resp: 18   SpO2: 100% 98%     I personally performed the services described in this documentation, which was scribed in my presence. The recorded information has been reviewed and is accurate.   Melissa Bender, PA-C 07/13/15 1528  Melissa Muskrat, MD 07/13/15 [phone]

## 2015-07-13 NOTE — ED Notes (Addendum)
Pt states she has not slept in a long time, finally states she sleeps from about 8 pm to 12 MN, pt thinks it is her medications. Just left [facility] with same complaint

## 2015-07-13 NOTE — Discharge Instructions (Signed)
You have been seen today for insomnia. Follow up with your psychiatrist as soon as possible for possible medication adjustment. Return to ED should symptoms worsen. Take the Benadryl or the melatonin for sleep.   Emergency Department Resource Guide 1) Find a Doctor and Pay Out of Pocket Although you won't have to find out who is covered by your insurance plan, it is a good idea to ask around and get recommendations. You will then need to call the office and see if the doctor you have chosen will accept you as a new patient and what types of options they offer for patients who are self-pay. Some doctors offer discounts or will set up payment plans for their patients who do not have insurance, but you will need to ask so you aren't surprised when you get to your appointment.  2) Contact Your Local Health Department Not all health departments have doctors that can see patients for sick visits, but many do, so it is worth a call to see if yours does. If you don't know where your local health department is, you can check in your phone book. The CDC also has a tool to help you locate your state's health department, and many state websites also have listings of all of their local health departments.  3) Find a [facility] If your illness is not likely to be very severe or complicated, you may want to try a walk in clinic. These are popping up all over the country in pharmacies, drugstores, and shopping centers. They're usually staffed by nurse practitioners or physician assistants that have been trained to treat common illnesses and complaints. They're usually fairly quick and inexpensive. However, if you have serious medical issues or chronic medical problems, these are probably not your best option.  No Primary Care Doctor: - Call Health Connect at  [phone] - they can help you locate a primary care doctor that  accepts your insurance, provides certain services, etc. - Physician Referral Service-  [phone]  Chronic Pain Problems: Organization         Address  Phone   Notes  [facility]  [phone] Patients need to be referred by their primary care doctor.   Medication Assistance: Organization         Address  Phone   Notes  [facility] Medication [facility] [address] [place], [place] [address] [phone] --Must be a resident of [facility] -- Must have NO insurance coverage whatsoever (no Medicaid/ Medicare, etc.) -- The pt. MUST have a primary care doctor that directs their care regularly and follows them in the community   MedAssist  [phone]   [employer]  [phone]    Agencies that provide inexpensive medical care: Organization         Address  Phone   Notes  [place]  [phone]   [facility]    [phone]   [facility] [place], [place] [address] [phone]   [place] [address] [place] [phone]   Planned Parenthood    [phone]   [facility]    [phone]   [place] and [place] [address], [place] Phone:  [phone], Fax:  [phone] Hours of Operation:  9 am - 6 pm, M-F.  Also accepts Medicaid/Medicare and self-pay.  [facility]  [address], [place] Phone: [phone], Fax: [phone]. Hours of Operation:  8:30 am - 5:30 pm, M-F.  Also accepts Medicaid and self-pay.  [facility] [place] [address] [place] Phone: [phone]   [place], [place], [place] [phone], Ext. [phone] Mondays & Thursdays: 7-9 AM.  First 15 patients are seen on a first come, first serve basis.    [place] Providers:  Organization         Address  Phone   Notes  [facility] [address],  [place] [phone] Also accepts self-pay patients.  [facility] [id number] [address], [place]  [phone]   [place], [address], [place] [phone]   [facility] [address] [place] [phone]   Lucianne Lei [address], [place]   [phone] Only accepts [place] Access [place] patients after they have their name applied to their card.   Self-Pay (no insurance) in [facility]:  Organization         Address  Phone   Notes  Sickle Cell Patients, [facility] Internal Medicine [place] [phone]   [facility] Urgent Care [place] [phone]   [facility] [place]  [place], [place], [place] [phone]   [facility]/Dr. Osei-Bonsu  [address] [place] or [address], [place] [phone] Phone number for both [place] and [place] locations is the same.  Urgent Medical and [facility] [address] [place] [phone]   [facility] [address] [place] or [address] [place] [address] [phone]   [facility] [address] [place] [phone], phone; [phone], fax Sees patients 1st and 3rd Saturday of every month.  Must not qualify for public or private insurance (i.e. Medicaid, Medicare, [place] Health Choice, Veterans' Benefits)  Household income should be no more than 200% of the poverty level The clinic cannot treat you if you are pregnant or think you are pregnant  Sexually transmitted diseases are not treated at the clinic.    Dental Care: Organization         Address  Phone  Notes  [facility] Department of [facility] [phone] Accepts children up to age 64 who are enrolled in  [place] or [place]; pregnant women with a Medicaid card; and children who have applied for Medicaid or [place] Health Choice, but were declined, whose parents can pay a reduced fee at time of service.  [facility] Department of [facility]  [address], [place] [phone] Accepts children up to age 59 who are enrolled in [place] or [place]; pregnant women with a Medicaid card; and children who have applied for Medicaid or [place] Health Choice, but were declined, whose parents can pay a reduced fee at time of service.  [place] Adult Dental Access PROGRAM  [place] [phone] Patients are seen by appointment only. Walk-ins are not accepted. [place] will see patients 70 years of age and older. Monday - Tuesday (8am-5pm) Most Wednesdays (8:30-5pm) $30 per visit, cash only  [place] Adult Dental Access PROGRAM  [address], [facility] [phone] Patients are seen by appointment only. Walk-ins are not accepted. [place] will see patients 55 years of age and older. One Wednesday Evening (Monthly: Volunteer Based).  $30 per visit, cash only  [place]  [phone] for adults; Children under age 16, call Graduate Pediatric Dentistry at [phone]. Children aged 15-14, please call [phone] to request a pediatric application.  Dental services are provided in all areas of dental care including fillings, crowns and bridges, complete and partial dentures, implants, gum treatment, root canals, and extractions. Preventive care is also provided. Treatment is provided to both adults and children. Patients are selected via a lottery and there is often a waiting list.   [facility] [address] [place]  [phone] [email or web address]   [facility] [address] [place], [place] [phone], Ext. [phone] Second and Fourth Thursday of each month, opens at 6:30  AM; Clinic ends at 9 AM.  Patients are seen on a first-come first-served basis, and a limited number are seen during each clinic.   [facility]  [address] [place] Danker [place], [place] [phone]   Eligibility Requirements You must have lived in [place], [place], or [place] counties for at least the last three months.   You cannot be eligible for state or federal sponsored [employer], including [employer], [place], or [employer].   You generally cannot be eligible for healthcare insurance through your employer.    How to apply: Eligibility screenings are held every Tuesday and Wednesday afternoon from 1:00 pm until 4:00 pm. You do not need an appointment for the interview!  [facility] [address] [place], [place]   [employer]  [place] Department  [place]  [phone]    Behavioral Health Resources in the Community: Intensive Outpatient Programs Organization         Address  Phone  Notes  [place]. [address], [place], [place] [phone]   [facility] [address], [place], [employer]   ADS: Alcohol & Drug Svcs [address], [place], [place]   [place] [address]  [place], [place] or [phone]   Substance Abuse Resources Organization         Address  Phone  Notes  Alcohol and Drug Services  [phone]   [place]  [phone]   [facility] [place]   Chinita Pester  [phone]   Residential & Outpatient Substance Abuse Program  [phone]   Psychological Services Organization         Address  Phone  Notes  [facility] [place]  [place]  [phone]   [place] [address] [place] [phone] or  [phone]    Mobile Crisis Teams Organization         Address  Phone  Notes  [facility], Mobile Crisis Care Unit  [phone]   [facility] Psychotherapeutic Services  [address] [place], [place]   [place] [address] [place] [phone]    Self-Help/Support Groups Organization         Address  Phone             Notes  Mental Health Assoc. of [place] - variety of support groups  [place] Call for more information  Narcotics Anonymous (NA), [facility] [address], [employer] [place]  2 meetings at this location   Special educational needs teacher         Address  Phone  Notes  [facility] [place],    [place]  [phone]   [facility]  [address], [place] [address], [place], [place]   [place], [place] [phone] Admissions: 8am-3pm M-F  Incentives Substance [place] [address],    [place], [place] [phone]   [facility] [address] [place], [place], [place]   [facility] [address],  [place], [place]   [facility] Programs - Intensive Outpatient [address], [place], [place]   [facility] ([place].) [address].,  [place], [place] [phone] or [phone]   [facility] Services (RTS) [address] [place], [place] Accepts Medicaid  [facility] [place] [address]  [place] [phone] Substance Abuse/Addiction Treatment   [facility] Organization         Address  Phone  Notes  [facility]  [phone]   Domenic Schwab, PhD [address] Arlis [place], [place]   [phone] or [phone]   [place], [place] [phone]   [facility] [address],  [place], [place] [phone] Insurance/Medicaid/sponsorship through [facility] and Families [address], [address]                                    [place], [place] [phone] [place] [address], [place] [phone]    Dr. Adele Schilder  [phone]   [facility] Dept. 1) [address] [place] 2) [place] 3)  [place] [address], [place] [phone]  [phone]   [place] [phone] or [phone] (After Hours)

## 2015-07-13 NOTE — ED Notes (Signed)
Pt sts she hasn't slept in 4 weeks. sts that she has been taking her meds are not working. Depakote and Risperdal. sts also not eating.

## 2015-07-17 ENCOUNTER — Emergency Department (INDEPENDENT_AMBULATORY_CARE_PROVIDER_SITE_OTHER)
Admission: EM | Admit: 2015-07-17 | Discharge: 2015-07-17 | Disposition: A | Discharge status: Home / Self Care | Payer: Commercial Managed Care - HMO | Source: Home / Self Care | Attending: Family Medicine | Admitting: Family Medicine

## 2015-07-17 ENCOUNTER — Encounter (HOSPITAL_COMMUNITY): Payer: Self-pay | Admitting: Emergency Medicine

## 2015-07-17 DIAGNOSIS — R202 Paresthesia of skin: Secondary | ICD-10-CM | POA: Diagnosis not present

## 2015-07-17 LAB — GLUCOSE, CAPILLARY: Glucose-Capillary: 85 mg/dL (ref 65–99)

## 2015-07-17 MED ORDER — NAPROXEN 500 MG PO TABS: 500.0000 mg | ORAL_TABLET | Freq: Two times a day (BID) | ORAL | Status: DC

## 2015-07-17 NOTE — ED Provider Notes (Signed)
CSN: [id number]     Arrival date & time 07/17/15  1438 History   First MD Initiated Contact with Patient 07/17/15 1703     Chief Complaint  Patient presents with  . Rash   (Consider location/radiation/quality/duration/timing/severity/associated sxs/prior Treatment) HPI History obtained from patient:   LOCATION:both feet SEVERITY:hurts bad DURATION:today CONTEXT:sudden onset this morning of burning pain both plantar aspects of feet QUALITY:never happened before MODIFYING FACTORS:came to [facility] no home treatment ASSOCIATED SYMPTOMS:none TIMING:constant OCCUPATION:  Past Medical History  Diagnosis Date  . Seizures ([place])   . Alcohol abuse   . Bipolar disorder ([place])   . Depression    Past Surgical History  Procedure Laterality Date  . Back surgery    . Tubal ligation     Family History  Problem Relation Age of Onset  . Diabetes Mother   . Hypertension Mother    Social History  Substance Use Topics  . Smoking status: Never Smoker   . Smokeless tobacco: None  . Alcohol Use: No   OB History    Gravida Para Term Preterm AB TAB SAB Ectopic Multiple Living   2 2 2       2      Review of Systems ROS +'ve bilateral foot pain  Denies: HEADACHE, NAUSEA, ABDOMINAL PAIN, CHEST PAIN, CONGESTION, DYSURIA, SHORTNESS OF BREATH  Allergies  Review of patient's allergies indicates no known allergies.  Home Medications   Prior to Admission medications   Medication Sig Start Date End Date Taking? Authorizing Provider  diphenhydrAMINE (BENADRYL) 25 MG tablet Take 2 tablets (50 mg total) by mouth at bedtime as needed for sleep. 07/13/15   Shawn C Joy, PA-C  divalproex (DEPAKOTE ER) 500 MG 24 hr tablet Take 500 mg by mouth at bedtime.     Historical Provider, MD  HYDROcodone-acetaminophen (NORCO/VICODIN) 5-325 MG tablet Take 2 tablets by mouth every 4 (four) hours as needed. 07/01/15   Leonard Schwartz, MD  Melatonin 1 MG CAPS Take 1 capsule (1 mg total) by mouth at bedtime as needed. 07/13/15    Shawn C Joy, PA-C  Multiple Vitamin (MULTIVITAMIN) tablet Take 1 tablet by mouth daily.    Historical Provider, MD  risperiDONE (RISPERDAL) 1 MG tablet Take 0.5 mg by mouth at bedtime.     Historical Provider, MD   Meds Ordered and Administered this Visit  Medications - No data to display  BP 125/79 mmHg  Pulse 89  Temp(Src) 97.8 F (36.6 C) (Oral)  Resp 16  SpO2 100%  LMP 07/17/2015 No data found.   Physical Exam  Constitutional: She is oriented to person, place, and time. She appears well-developed and well-nourished.  HENT:  Head: Normocephalic and atraumatic.  Eyes: Conjunctivae are normal.  Pulmonary/Chest: Effort normal and breath sounds normal. She exhibits tenderness.  Musculoskeletal: Normal range of motion.  Neurological: She is alert and oriented to person, place, and time.  Skin: Skin is warm and dry.  Psychiatric: She has a normal mood and affect. Her behavior is normal.    ED Course  Procedures (including critical care time)  Labs Review Labs Reviewed  GLUCOSE, CAPILLARY    Imaging Review No results found.   Visual Acuity Review  Right Eye Distance:   Left Eye Distance:   Bilateral Distance:    Right Eye Near:   Left Eye Near:    Bilateral Near:        Blood glucose is 85 MDM   1. Paresthesia of both feet   Patient is advised  to continue home symptomatic treatment. Prescription for Naprosyn  sent pharmacy patient has indicated. Patient is advised that if there are new or worsening symptoms or attend the emergency department, or contact primary care provider. Instructions of care provided discharged home in stable condition. Return to work/school note provided.  THIS NOTE WAS GENERATED USING A VOICE RECOGNITION SOFTWARE PROGRAM. ALL REASONABLE EFFORTS  WERE MADE TO PROOFREAD THIS DOCUMENT FOR ACCURACY.     Konrad Felix, [place] 07/17/15 [phone]

## 2015-07-17 NOTE — Discharge Instructions (Signed)
Paresthesia  Paresthesia is a burning or prickling feeling. This feeling can happen in any part of the body. It often happens in the hands, arms, legs, or feet. Usually, it is not painful. In most cases, the feeling goes away in a short time and is not a sign of a serious problem.  HOME CARE  · Avoid drinking alcohol.  · Try massage or needle therapy (acupuncture) to help with your problems.  · Keep all follow-up visits as told by your doctor. This is important.  GET HELP IF:  · You keep on having episodes of paresthesia.  · Your burning or prickling feeling gets worse when you walk.  · You have pain or cramps.  · You feel dizzy.  · You have a rash.  GET HELP RIGHT AWAY IF:  · You feel weak.  · You have trouble walking or moving.  · You have problems speaking, understanding, or seeing.  · You feel confused.  · You cannot control when you pee (urinate) or poop (bowel movement).  · You lose feeling (numbness) after an injury.  · You pass out (faint).     This information is not intended to replace advice given to you by your health care provider. Make sure you discuss any questions you have with your health care provider.     Document Released: 04/24/2008 Document Revised: 09/26/2014 Document Reviewed: 05/08/2014  Elsevier Interactive Patient Education ©2016 Elsevier Inc.

## 2015-07-17 NOTE — ED Notes (Signed)
Patient complains of bilateral feet burning on the soles of feet.  Burning sensation for one week.  Patient denies any history of the same.  No change in medicine, no change in lotions.  Patient unable to wear shoes due to sensation.  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

## 2015-07-22 ENCOUNTER — Emergency Department (HOSPITAL_COMMUNITY)
Admission: EM | Admit: 2015-07-22 | Discharge: 2015-07-23 | Disposition: A | Discharge status: Other Acute Inpatient Hospital | Payer: Commercial Managed Care - HMO | Attending: Emergency Medicine | Admitting: Emergency Medicine

## 2015-07-22 ENCOUNTER — Encounter (HOSPITAL_COMMUNITY): Payer: Self-pay

## 2015-07-22 DIAGNOSIS — F419 Anxiety disorder, unspecified: Secondary | ICD-10-CM | POA: Insufficient documentation

## 2015-07-22 DIAGNOSIS — Z791 Long term (current) use of non-steroidal anti-inflammatories (NSAID): Secondary | ICD-10-CM | POA: Diagnosis not present

## 2015-07-22 DIAGNOSIS — Z3202 Encounter for pregnancy test, result negative: Secondary | ICD-10-CM | POA: Insufficient documentation

## 2015-07-22 DIAGNOSIS — F3160 Bipolar disorder, current episode mixed, unspecified: Secondary | ICD-10-CM | POA: Diagnosis present

## 2015-07-22 DIAGNOSIS — F3110 Bipolar disorder, current episode manic without psychotic features, unspecified: Secondary | ICD-10-CM

## 2015-07-22 DIAGNOSIS — G478 Other sleep disorders: Secondary | ICD-10-CM | POA: Diagnosis present

## 2015-07-22 DIAGNOSIS — G4700 Insomnia, unspecified: Secondary | ICD-10-CM | POA: Diagnosis not present

## 2015-07-22 DIAGNOSIS — F319 Bipolar disorder, unspecified: Secondary | ICD-10-CM | POA: Diagnosis not present

## 2015-07-22 DIAGNOSIS — Z79899 Other long term (current) drug therapy: Secondary | ICD-10-CM | POA: Diagnosis not present

## 2015-07-22 LAB — BASIC METABOLIC PANEL
Anion gap: 13 (ref 5–15)
BUN: 7 mg/dL (ref 6–20)
CO2: 20 mmol/L — ABNORMAL LOW (ref 22–32)
Calcium: 9.3 mg/dL (ref 8.9–10.3)
Chloride: 106 mmol/L (ref 101–111)
Creatinine, Ser: 0.87 mg/dL (ref 0.44–1.00)
GFR calc Af Amer: >60 mL/min (ref >60)
GFR calc non Af Amer: >60 mL/min (ref >60)
Glucose, Bld: 79 mg/dL (ref 65–99)
Potassium: 3.9 mmol/L (ref 3.5–5.1)
Sodium: 139 mmol/L (ref 135–145)

## 2015-07-22 LAB — RAPID URINE DRUG SCREEN, HOSP PERFORMED
Amphetamines: NOT DETECTED
Barbiturates: NOT DETECTED
Benzodiazepines: NOT DETECTED
Cocaine: NOT DETECTED
Opiates: NOT DETECTED
Tetrahydrocannabinol: NOT DETECTED

## 2015-07-22 LAB — CBC WITH DIFFERENTIAL/PLATELET
Basophils Absolute: 0 10*3/uL (ref 0.0–0.1)
Basophils Relative: 1 %
Eosinophils Absolute: 0 10*3/uL (ref 0.0–0.7)
Eosinophils Relative: 1 %
HCT: 39.1 % (ref 36.0–46.0)
Hemoglobin: 12.7 g/dL (ref 12.0–15.0)
Lymphocytes Relative: 45 %
Lymphs Abs: 1.9 10*3/uL (ref 0.7–4.0)
MCH: 22.7 pg — ABNORMAL LOW (ref 26.0–34.0)
MCHC: 32.5 g/dL (ref 30.0–36.0)
MCV: 69.8 fL — ABNORMAL LOW (ref 78.0–100.0)
Monocytes Absolute: 0.3 10*3/uL (ref 0.1–1.0)
Monocytes Relative: 6 %
Neutro Abs: 2 10*3/uL (ref 1.7–7.7)
Neutrophils Relative %: 47 %
Platelets: DECREASED 10*3/uL (ref 150–400)
RBC: 5.6 MIL/uL — ABNORMAL HIGH (ref 3.87–5.11)
RDW: 19.5 % — ABNORMAL HIGH (ref 11.5–15.5)
WBC: 4.2 10*3/uL (ref 4.0–10.5)

## 2015-07-22 LAB — POC URINE PREG, ED: Preg Test, Ur: NEGATIVE

## 2015-07-22 LAB — ETHANOL: Alcohol, Ethyl (B): <5 mg/dL (ref <5)

## 2015-07-22 MED ORDER — DIPHENHYDRAMINE HCL 25 MG PO CAPS: 50.0000 mg | ORAL_CAPSULE | Freq: Every evening | ORAL | Status: DC | PRN

## 2015-07-22 MED ORDER — ZOLPIDEM TARTRATE 5 MG PO TABS
5.0000 mg | ORAL_TABLET | Freq: Every evening | ORAL | Status: DC | PRN
  Administered 2015-07-22: 5 mg via ORAL
  Filled 2015-07-22: qty 1

## 2015-07-22 MED ORDER — RISPERIDONE 0.5 MG PO TABS
0.2500 mg | ORAL_TABLET | Freq: Every day | ORAL | Status: DC
  Administered 2015-07-22: 0.25 mg via ORAL
  Filled 2015-07-22: qty 1

## 2015-07-22 MED ORDER — ACETAMINOPHEN 325 MG PO TABS: 650.0000 mg | ORAL_TABLET | ORAL | Status: DC | PRN

## 2015-07-22 MED ORDER — ONDANSETRON HCL 4 MG PO TABS
4.0000 mg | ORAL_TABLET | Freq: Three times a day (TID) | ORAL | Status: DC | PRN
  Administered 2015-07-23: 4 mg via ORAL
  Filled 2015-07-22: qty 1

## 2015-07-22 MED ORDER — DIVALPROEX SODIUM ER 250 MG PO TB24
250.0000 mg | ORAL_TABLET | Freq: Every day | ORAL | Status: DC
  Administered 2015-07-22 – 2015-07-23 (×2): 250 mg via ORAL
  Filled 2015-07-22 (×2): qty 1

## 2015-07-22 MED ORDER — MELATONIN 1 MG PO CAPS
1.0000 mg | ORAL_CAPSULE | Freq: Every evening | ORAL | Status: DC | PRN
Start: 2015-07-22 — End: 2015-07-22

## 2015-07-22 MED ORDER — NAPROXEN 500 MG PO TABS
500.0000 mg | ORAL_TABLET | Freq: Two times a day (BID) | ORAL | Status: DC
  Administered 2015-07-22 – 2015-07-23 (×2): 500 mg via ORAL
  Filled 2015-07-22 (×2): qty 1

## 2015-07-22 NOTE — BH Assessment (Signed)
Assessment completed. Consulted with Waylan Boga, DNP who recommends patient be observed overnight and evaluated in the morning by psychiatry.   Rosalin Hawking, LCSW Therapeutic Triage Specialist [place] 07/22/2015 5:46 PM

## 2015-07-22 NOTE — ED Notes (Signed)
Patient and belongings have been wanded by security. One patient belongings bag and a black messenger bag.

## 2015-07-22 NOTE — ED Notes (Signed)
Pt is here and wanting changes to her bipolar meds.  Pt states she went to [facility] 3 days in a row to see "fred".  Told she did not have appointment and was not able to see him.  He is the one who adjusts her meds.  Pt is only getting 4 hours sleep at a time.  Nauseated and irritated easily.  She is taking her meds religiously.  Lives on her own and going to school.  Pt also having social issues between her friend and her foster family.

## 2015-07-22 NOTE — Progress Notes (Signed)
PHARMACIST - PHYSICIAN ORDER COMMUNICATION  CONCERNING: P&T Medication Policy on Herbal Medications  DESCRIPTION:  This patient's order for:  melatonin  has been noted.  This product(s) is classified as an "herbal" or natural product. Due to a lack of definitive safety studies or FDA approval, nonstandard manufacturing practices, plus the potential risk of unknown drug-drug interactions while on inpatient medications, the Pharmacy and Therapeutics Committee does not permit the use of "herbal" or natural products of this type within [facility].   ACTION TAKEN: The pharmacy department is unable to verify this order at this time and your patient has been informed of this safety policy. Please reevaluate patient's clinical condition at discharge and address if the herbal or natural product(s) should be resumed at that time.  Dolly Rias RPh 07/22/2015, 6:53 PM Pager [phone]

## 2015-07-22 NOTE — ED Notes (Signed)
Attempted to get labs, pt is hard stick and unable to obtain labs

## 2015-07-22 NOTE — BH Assessment (Addendum)
Assessment Note  Melissa Dougherty is an 45 y.o. female presenting to [facility] voluntarily to "get some help because I haven't been sleep in three or four months now, I'm trying to get some help." . "because I need to go to sleep and I need people to leave me alone, I need some sleep, I haven't been sleeping in a while, so can y'all just let me get some sleep." Patient denies SI/HI and AVH and does not appear to be responding to internal stimuli.  Patient states that she went to [facility] Thursday, Friday, and Monday and was not admitted and decided to come into the ED for help. Patient denies SI and denies history of attempts. Patient denies previous self-injurious behaviors. Patient denies HI and history of being violent. Patient denies having access to weapons. Patient denies pending charges and upcoming court dates.  Patient denies AVH and does not appear to be responding to internal stimuli. Patient states that she has a history of abusing Alcohol, THC, and Cocaine. Patient states that she has not used cocaine or THC since 2013. Patient states that she drinks "a forty ounce every chance I get" which is usually about once per week and she last drank a forty ounce Friday morning. Patient urine drug screen not collected at time of assessment. Patient blood alcohol level not collected at time of assessment.    Patient is alert and oriented x4. Patient assessed in triage in a chair sitting upright dressed in her clothing. Patient was under a blanket during the assessment. Patient made good eye contact. Patient states that she sleeps about four hours per night and has not had an appetite lately. Patient states that she goes to [facility] but is not sure how long she has had services there. Patient states that she has had one previous inpatient hospitalization, records indicate that patient was admitted to [facility] Observation unit and discharged on 12/23/2013 and admitted to [facility] inpatient that day.  Patient states that her most  recent stressor is obtaining her GED at [employer] and problems with her foster parents and best friend. Patient endorses symptoms of depression as; insomnia, tearfulness, fatigue, and isolating at times. Patient is requesting to be admitted to [facility] "for three weeks."  Consulted with Waylan Boga, DNP who recommends patient be observed overnight and evaluated by psychiatry in the morning.    Diagnosis: Bipolar Disprder  Past Medical History:  Past Medical History  Diagnosis Date  . Seizures ([place])   . Alcohol abuse   . Bipolar disorder ([place])   . Depression     Past Surgical History  Procedure Laterality Date  . Back surgery    . Tubal ligation      Family History:  Family History  Problem Relation Age of Onset  . Diabetes Mother   . Hypertension Mother     Social History:  reports that she has never smoked. She does not have any smokeless tobacco history on file. She reports that she does not drink alcohol or use illicit drugs.  Additional Social History:  Alcohol / Drug Use Pain Medications: See PTA Prescriptions: See PTA Over the Counter: See PTA History of alcohol / drug use?: Yes Longest period of sobriety (when/how long): 3 years Substance #1 Name of Substance 1: Cocaine 1 - Age of First Use: 21 1 - Amount (size/oz): one ounce 1 - Frequency: "when I got my hands on it" 1 - Duration: ongoing 1 - Last Use / Amount: 2013 Substance #2 Name of  Substance 2: Alcohol 2 - Age of First Use: 22 2 - Amount (size/oz): "a 40 ounce" 2 - Frequency: "on a regular" 2 - Duration: ongoing 2 - Last Use / Amount: Friday morning, one 40 ounce Substance #3 Name of Substance 3: THC 3 - Age of First Use: 18 3 - Amount (size/oz): "an ounce" 3 - Frequency: "like twice a day" 3 - Duration: ongoing 3 - Last Use / Amount: 2013  CIWA: CIWA-Ar BP: 109/77 mmHg Pulse Rate: 103 COWS:    Allergies: No Known Allergies  Home Medications:  (Not in a hospital admission)  OB/GYN Status:   Patient's last menstrual period was 07/17/2015.  General Assessment Data [facility] of [facility]: [facility] TTS Assessment: In system Is this a Tele or Face-to-Face Assessment?: Face-to-Face Is this an Initial Assessment or a Re-assessment for this encounter?: Initial Assessment Marital status: Single Is patient pregnant?: Unknown ("I think I may be") Pregnancy Status: Unknown Living Arrangements: Alone Can pt return to current living arrangement?: Yes Admission Status: Voluntary Is patient capable of signing voluntary admission?: Yes Referral Source: Self/Family/Friend Insurance type: [employer] Medicare     Crisis Care Plan Living Arrangements: Alone Name of Psychiatrist: Warden/ranger Name of Therapist: Beverly Sessions  Josph Macho)  Education Status Is patient currently in school?: Yes Current Grade:  (getting GED) Name of school: [employer] to self with the past 6 months Suicidal Ideation: No Has patient been a risk to self within the past 6 months prior to admission? : No Suicidal Intent: No Has patient had any suicidal intent within the past 6 months prior to admission? : No Is patient at risk for suicide?: No Suicidal Plan?: No Has patient had any suicidal plan within the past 6 months prior to admission? : No Access to Means: No What has been your use of drugs/alcohol within the last 12 months?: Drinks 40 ounce beers Previous Attempts/Gestures: No How many times?: 0 Other Self Harm Risks: denies Triggers for Past Attempts: None known Intentional Self Injurious Behavior: None Family Suicide History: No Recent stressful life event(s): Conflict (Comment) (with best friend and foster parents "the [employer]") Persecutory voices/beliefs?: No Depression: Yes Depression Symptoms: Insomnia, Tearfulness, Isolating, Fatigue Substance abuse history and/or treatment for substance abuse?: Yes (2013) Suicide prevention information given to non-admitted patients: Not applicable  Risk to Others within  the past 6 months Homicidal Ideation: No Does patient have any lifetime risk of violence toward others beyond the six months prior to admission? : No Thoughts of Harm to Others: No Current Homicidal Intent: No Current Homicidal Plan: No Access to Homicidal Means: No Identified Victim: Denies History of harm to others?: No Assessment of Violence: None Noted Violent Behavior Description: Denies Does patient have access to weapons?: No Criminal Charges Pending?: No Does patient have a court date: No Is patient on probation?: No  Psychosis Hallucinations: None noted Delusions: None noted  Mental Status Report Appearance/Hygiene: Unable to Assess Eye Contact: Good Motor Activity: Unable to assess Speech: Slow Level of Consciousness: Alert Mood: Pleasant Affect: Appropriate to circumstance Anxiety Level: None Thought Processes: Coherent, Relevant Judgement: Unimpaired Orientation: Person, Place, Time, Situation, Appropriate for developmental age Obsessive Compulsive Thoughts/Behaviors: None  Cognitive Functioning Concentration: Decreased Memory: Recent Intact, Remote Intact IQ: Average Insight: Fair Impulse Control: Good Appetite: Poor Sleep: Decreased Total Hours of Sleep: 4 Vegetative Symptoms: None  ADLScreening [facility] Assessment Services) Patient's cognitive ability adequate to safely complete daily activities?: Yes Patient able to express need for assistance with ADLs?: Yes Independently performs  ADLs?: Yes (appropriate for developmental age)  Prior Inpatient Therapy Prior Inpatient Therapy: Yes Prior Therapy Dates: 2012 Prior Therapy Facilty/Provider(s): [facility] Reason for Treatment: "I don't remember"  Prior Outpatient Therapy Prior Outpatient Therapy: Yes Prior Therapy Dates: [facility] Prior Therapy Facilty/Provider(s): [facility] Reason for Treatment: Bipolar Does patient have an ACCT team?: No Does patient have Intensive In-House Services?  : No Does patient have  [facility] services? : Yes Does patient have P4CC services?: No  ADL Screening (condition at time of admission) Patient's cognitive ability adequate to safely complete daily activities?: Yes Is the patient deaf or have difficulty hearing?: No Does the patient have difficulty seeing, even when wearing glasses/contacts?: No Does the patient have difficulty concentrating, remembering, or making decisions?: No Patient able to express need for assistance with ADLs?: Yes Does the patient have difficulty dressing or bathing?: No Independently performs ADLs?: Yes (appropriate for developmental age) Does the patient have difficulty walking or climbing stairs?: No Weakness of Legs: None Weakness of Arms/Hands: None  Home Assistive Devices/Equipment Home Assistive Devices/Equipment: None  Therapy Consults (therapy consults require a physician order) PT Evaluation Needed: No OT Evalulation Needed: No SLP Evaluation Needed: No Abuse/Neglect Assessment (Assessment to be complete while patient is alone) Physical Abuse: Denies Verbal Abuse: Denies Sexual Abuse: Denies Exploitation of patient/patient's resources: Denies Self-Neglect: Denies Values / Beliefs Cultural Requests During Hospitalization: None Spiritual Requests During Hospitalization: None Consults Spiritual Care Consult Needed: No Social Work Consult Needed: No Regulatory affairs officer (For Healthcare) Does patient have an advance directive?: Yes Would patient like information on creating an advanced directive?: No - patient declined information Type of Advance Directive: [place] Copy of advanced directive(s) in chart?: No - copy requested    Additional Information 1:1 In Past 12 Months?: No CIRT Risk: No Elopement Risk: No Does patient have medical clearance?: No     Disposition:  Disposition Initial Assessment Completed for this Encounter: Yes Disposition of Patient: Other dispositions  (observe overnight per Waylan Boga, DNP) Other disposition(s): Other (Comment) (observe overnight per Waylan Boga, DNP)  On Site Evaluation by:   Reviewed with Physician:    Hanako Tipping 07/22/2015 6:20 PM

## 2015-07-22 NOTE — ED Notes (Signed)
Bed: [id number] Expected date: 07/22/15 Expected time: 10:15 PM Means of arrival:  Comments: Triage 5

## 2015-07-22 NOTE — ED Notes (Signed)
Patient cooperative. Appears tangential, FOI. Denies SI, HI, AVH. Reports feeling sick since Feb 5. Reports ongoing GI/GU pain and discomfort. Reports that illness has made patient irritable. Reports lack of family and friend support. "my foster family [place] Harps) thinks I am lying or faking and they believe Sister Rhonda's (best friend) lies about me".  Encouragement offered. Oriented to unit. Medications given.  Q 15 safety checks in place.

## 2015-07-22 NOTE — ED Provider Notes (Signed)
CSN: [id number]     Arrival date & time 07/22/15  1517 History   First MD Initiated Contact with Patient 07/22/15 [phone]     Chief Complaint  Patient presents with  . Medication Problem     (Consider location/radiation/quality/duration/timing/severity/associated sxs/prior Treatment) HPI Melissa Dougherty is a 45 y.o. female with history of seizures, alcohol abuse, bipolar, presents to emergency department complaining of worsening psychiatric illness. Patient states she has bipolar disorder and states "is out of control." She states she is unable to sleep with 3-4 hours a night, she states she is unable to function. Patient goes to [employer] and she is unable to focus when she is in school. She states she [facility] to [facility], states she went there Thursday, Friday, and again Monday of this past week. She states she was unable to be seen because she did not have an appointment. She states her appointment is March 17. States she is "unable to live like this any longer." She believes she needs to go inpatient into a psychiatric facility. Patient denies any drug use or alcohol use. She states "I am very sick and nobody cares."  Denies SI or HI    Past Medical History  Diagnosis Date  . Seizures ([place])   . Alcohol abuse   . Bipolar disorder ([place])   . Depression    Past Surgical History  Procedure Laterality Date  . Back surgery    . Tubal ligation     Family History  Problem Relation Age of Onset  . Diabetes Mother   . Hypertension Mother    Social History  Substance Use Topics  . Smoking status: Never Smoker   . Smokeless tobacco: None  . Alcohol Use: No   OB History    Gravida Para Term Preterm AB TAB SAB Ectopic Multiple Living   2 2 2       2      Review of Systems  Constitutional: Negative for fever and chills.  Respiratory: Negative for cough, chest tightness and shortness of breath.   Cardiovascular: Negative for chest pain, palpitations and leg swelling.  Gastrointestinal:  Negative for nausea, vomiting, abdominal pain and diarrhea.  Genitourinary: Negative for dysuria, flank pain and pelvic pain.  Musculoskeletal: Negative for myalgias, arthralgias, neck pain and neck stiffness.  Skin: Negative for rash.  Neurological: Negative for dizziness, weakness and headaches.  Psychiatric/Behavioral: Positive for sleep disturbance, dysphoric mood and agitation. The patient is nervous/anxious.   All other systems reviewed and are negative.     Allergies  Review of patient's allergies indicates no known allergies.  Home Medications   Prior to Admission medications   Medication Sig Start Date End Date Taking? Authorizing Provider  diphenhydrAMINE (BENADRYL) 25 MG tablet Take 2 tablets (50 mg total) by mouth at bedtime as needed for sleep. 07/13/15   Shawn C Joy, PA-C  divalproex (DEPAKOTE ER) 500 MG 24 hr tablet Take 500 mg by mouth at bedtime.     Historical Provider, MD  HYDROcodone-acetaminophen (NORCO/VICODIN) 5-325 MG tablet Take 2 tablets by mouth every 4 (four) hours as needed. 07/01/15   Leonard Schwartz, MD  Melatonin 1 MG CAPS Take 1 capsule (1 mg total) by mouth at bedtime as needed. 07/13/15   Shawn C Joy, PA-C  Multiple Vitamin (MULTIVITAMIN) tablet Take 1 tablet by mouth daily.    Historical Provider, MD  naproxen (NAPROSYN) 500 MG tablet Take 1 tablet (500 mg total) by mouth 2 (two) times daily. 07/17/15  Konrad Felix, PA  risperiDONE (RISPERDAL) 1 MG tablet Take 0.5 mg by mouth at bedtime.     Historical Provider, MD   BP 109/77 mmHg  Pulse 103  Temp(Src) 97.7 F (36.5 C) (Oral)  Resp 17  SpO2 100%  LMP 07/17/2015 Physical Exam  Constitutional: She is oriented to person, place, and time. She appears well-developed and well-nourished. No distress.  HENT:  Head: Normocephalic.  Eyes: Conjunctivae are normal.  Neck: Neck supple.  Cardiovascular: Normal rate, regular rhythm and normal heart sounds.   Pulmonary/Chest: Effort normal and breath sounds  normal. No respiratory distress. She has no wheezes. She has no rales.  Abdominal: Soft. Bowel sounds are normal. She exhibits no distension. There is no tenderness. There is no rebound.  Musculoskeletal: She exhibits no edema.  Neurological: She is alert and oriented to person, place, and time.  Skin: Skin is warm and dry.  Psychiatric: Her affect is angry. Her speech is delayed. She is aggressive and slowed.  Nursing note and vitals reviewed.   ED Course  Procedures (including critical care time) Labs Review Labs Reviewed  CBC WITH DIFFERENTIAL/PLATELET - Abnormal; Notable for the following:    RBC 5.60 (*)    MCV 69.8 (*)    MCH 22.7 (*)    RDW 19.5 (*)    All other components within normal limits  BASIC METABOLIC PANEL - Abnormal; Notable for the following:    CO2 20 (*)    All other components within normal limits  URINE RAPID DRUG SCREEN, HOSP PERFORMED  ETHANOL  POC URINE PREG, ED    Imaging Review No results found. I have personally reviewed and evaluated these images and lab results as part of my medical decision-making.   EKG Interpretation None      MDM   Final diagnoses:  Insomnia  Bipolar 1 disorder (HCC)   Pt with worsening depression and insomnia. Requesting help. Pt is not suicidal or homicidal. Will get TTS assessment  TTS assessed. Will hold for psychiatric clearance.   Filed Vitals:   07/22/15 1523 07/22/15 2150  BP: 109/77 111/83  Pulse: 103 79  Temp: 97.7 F (36.5 C)   TempSrc: Oral   Resp: 17 18  SpO2: 100% 100%     Jeannett Senior, PA-C 07/23/15 0026  Merrily Pew, MD 07/25/15 1108

## 2015-07-23 DIAGNOSIS — F419 Anxiety disorder, unspecified: Secondary | ICD-10-CM | POA: Diagnosis not present

## 2015-07-23 DIAGNOSIS — G4700 Insomnia, unspecified: Secondary | ICD-10-CM | POA: Diagnosis not present

## 2015-07-23 DIAGNOSIS — F3110 Bipolar disorder, current episode manic without psychotic features, unspecified: Secondary | ICD-10-CM

## 2015-07-23 DIAGNOSIS — Z79899 Other long term (current) drug therapy: Secondary | ICD-10-CM | POA: Diagnosis not present

## 2015-07-23 DIAGNOSIS — F10188 Alcohol abuse with other alcohol-induced disorder: Secondary | ICD-10-CM | POA: Diagnosis not present

## 2015-07-23 DIAGNOSIS — F312 Bipolar disorder, current episode manic severe with psychotic features: Secondary | ICD-10-CM | POA: Diagnosis not present

## 2015-07-23 DIAGNOSIS — Z3202 Encounter for pregnancy test, result negative: Secondary | ICD-10-CM | POA: Diagnosis not present

## 2015-07-23 DIAGNOSIS — G40909 Epilepsy, unspecified, not intractable, without status epilepticus: Secondary | ICD-10-CM | POA: Diagnosis not present

## 2015-07-23 DIAGNOSIS — Z791 Long term (current) use of non-steroidal anti-inflammatories (NSAID): Secondary | ICD-10-CM | POA: Diagnosis not present

## 2015-07-23 DIAGNOSIS — F3113 Bipolar disorder, current episode manic without psychotic features, severe: Secondary | ICD-10-CM | POA: Diagnosis not present

## 2015-07-23 DIAGNOSIS — D259 Leiomyoma of uterus, unspecified: Secondary | ICD-10-CM | POA: Diagnosis not present

## 2015-07-23 MED ORDER — RISPERIDONE 1 MG PO TABS: 1.0000 mg | ORAL_TABLET | Freq: Every day | ORAL | Status: DC

## 2015-07-23 MED ORDER — DIVALPROEX SODIUM ER 500 MG PO TB24: 500.0000 mg | ORAL_TABLET | Freq: Two times a day (BID) | ORAL | Status: DC

## 2015-07-23 NOTE — Progress Notes (Signed)
CSW spoke with Tressia Miners of [place] who states that she would like to do report with [facility] nurse in order for possible acceptance. CSW will inform nurse to call back [phone].  Willette Brace [id number] ED CSW 07/23/2015 4:58 PM

## 2015-07-23 NOTE — ED Notes (Signed)
D: Patient verbalizes readiness for transfer to the hospital: Denies SI/HI, is not psychotic or delusional, but feels that she needs in-patient treatment for her Bipolar manic exacerbation.   A: Discharge instructions read and discussed with patient. All belongings returned to pt.   R: Pt verbalized understanding of discharge instructions. Signed for return of belongings.   A: Escorted by [employer].

## 2015-07-23 NOTE — ED Notes (Signed)
Patient expresses concern about missing classes at [employer].  Patient upset that her family is not supportive of her. Feels that they should be less critical and opinionated and should help her with things like getting to class and check on her when sick and take her to the hospital. 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  Patient wants help with her GI upset and her current medications. States she has been to [place] and her doctor and not assisted. Patient admits to being easily offended and agitated in regards to her interactions with others.

## 2015-07-23 NOTE — BH Assessment (Signed)
[facility] Assessment Progress Note  The following facilities have been contacted to seek placement for this pt, with results as noted:  Beds available, information sent, decision pending:  [facility] Triage Specialist [phone]

## 2015-07-23 NOTE — ED Notes (Signed)
Pt is attention seeking and preoccupied with imaginary health problems. She watches TV in her room and laughs out loud frequently. She is cooperative at this time. Denies SI/HI and would like to return home.

## 2015-07-23 NOTE — Consult Note (Signed)
[facility] Consult   Reason for Consult:  Mania Referring Physician:  EDP Patient Identification: Melissa Dougherty MRN:  [id number] Principal Diagnosis: Bipolar I disorder, most recent episode (or current) manic ([place]) Diagnosis:   Patient Active Problem List   Diagnosis Date Noted  . Bipolar I disorder, most recent episode (or current) manic ([place]) [F31.10] 07/23/2015    Priority: High  . Bipolar I disorder, most recent episode (or current) mixed, moderate [F31.62] 12/24/2013  . Adjustment disorder with mixed anxiety and depressed mood [F43.23] 12/23/2013  . MDD (major depressive disorder) ([place]) [F32.9] 12/23/2013  . Depressive disorder [F32.9] 12/22/2013  . Papanicolaou smear of cervix with atypical squamous cells of undetermined significance (ASC-US) [R87.610] 10/12/2013  . DEPRESSION [F32.9] 02/17/2007  . GERD [K21.9] 02/17/2007  . SEIZURE DISORDER, HX OF [Z86.69] 02/17/2007    Total Time spent with patient: 45 minutes  Subjective:   Melissa Dougherty is a 45 y.o. female patient admitted with mania.  HPI:   Melissa Dougherty is a 45 year-old African-American female admitted to the emergency department with mania. Melissa Dougherty reports trying to be seen at her outpatient facility, [facility], "because I knew that I was getting manic two weeks ago. I could feel it. I wasn't sleeping and my medications weren't working for me." Melissa Dougherty reports taking risperdal (.51m) and depakote (2597mQHS) but states "they weren't working for me." On assessment, Melissa Dougherty is hyperverbal, her speech is pressured, and she is easily distracted. Patient reports symptoms including increased energy, insomnia, and elevated mood. Melissa Dougherty denies suicidal ideation, homicidal ideation, and auditory or visual hallucinations. Melissa Dougherty denies alcohol or drug use.  Past Psychiatric History:  - Patient reports a history of Bipolar I Disoder - Patient reports being seen outpatient at [facility] Self: Suicidal Ideation:  No Suicidal Intent: No Is patient at risk for suicide?: No Suicidal Plan?: No Access to Means: No What has been your use of drugs/alcohol within the last 12 months?: Drinks 40 ounce beers How many times?: 0 Other Self Harm Risks: denies Triggers for Past Attempts: None known Intentional Self Injurious Behavior: None Risk to Others: Homicidal Ideation: No Thoughts of Harm to Others: No Current Homicidal Intent: No Current Homicidal Plan: No Access to Homicidal Means: No Identified Victim: Denies History of harm to others?: No Assessment of Violence: None Noted Violent Behavior Description: Denies Does patient have access to weapons?: No Criminal Charges Pending?: No Does patient have a court date: No Prior Inpatient Therapy: Prior Inpatient Therapy: Yes Prior Therapy Dates: 2012 [facility] "I don't remember" Prior Outpatient Therapy: Prior Outpatient Therapy: Yes Prior Therapy Dates: [facility] Prior Therapy Facilty/Provider(s): [facility] Reason for Treatment: Bipolar Does patient have an ACCT team?: No Does patient have Intensive In-House Services?  : No Does patient have [facility] services? : Yes Does patient have P4[facility] services?: No  Past Medical History:  Past Medical History  Diagnosis Date  . Seizures ([facility]  . Alcohol abuse   . Bipolar disorder ([place]  . Depression     Past Surgical History  Procedure Laterality Date  . Back surgery    . Tubal ligation     Family History:  Family History  Problem Relation Age of Onset  . Diabetes Mother   . Hypertension Mother    Family Psychiatric  History: none reported Social History:  History  Alcohol Use No     History  Drug Use No    Social History   Social  History  . Marital Status: Single    Spouse Name: N/A  . Number of Children: N/A  . Years of Education: N/A   Social History Main Topics  . Smoking status: Never Smoker   . Smokeless tobacco: None  . Alcohol  Use: No  . Drug Use: No  . Sexual Activity: Not Currently    Birth Control/ Protection: Surgical   Other Topics Concern  . None   Social History Narrative   Additional Social History:    Allergies:  No Known Allergies  Labs:  Results for orders placed or performed during the hospital encounter of 07/22/15 (from the past 48 hour(s))  CBC with Differential     Status: Abnormal   Collection Time: 07/22/15  5:30 PM  Result Value Ref Range   WBC 4.2 4.0 - 10.5 K/uL   RBC 5.60 (H) 3.87 - 5.11 MIL/uL   Hemoglobin 12.7 12.0 - 15.0 g/dL   HCT 39.1 36.0 - 46.0 %   MCV 69.8 (L) 78.0 - 100.0 fL   MCH 22.7 (L) 26.0 - 34.0 pg   MCHC 32.5 30.0 - 36.0 g/dL   RDW 19.5 (H) 11.5 - 15.5 %   Platelets  150 - 400 K/uL    PLATELET CLUMPS NOTED ON SMEAR, COUNT APPEARS DECREASED   Neutrophils Relative % 47 %   Lymphocytes Relative 45 %   Monocytes Relative 6 %   Eosinophils Relative 1 %   Basophils Relative 1 %   Neutro Abs 2.0 1.7 - 7.7 K/uL   Lymphs Abs 1.9 0.7 - 4.0 K/uL   Monocytes Absolute 0.3 0.1 - 1.0 K/uL   Eosinophils Absolute 0.0 0.0 - 0.7 K/uL   Basophils Absolute 0.0 0.0 - 0.1 K/uL   RBC Morphology TARGET CELLS     Comment: POLYCHROMASIA PRESENT  Basic metabolic panel     Status: Abnormal   Collection Time: 07/22/15  5:30 PM  Result Value Ref Range   Sodium 139 135 - 145 mmol/L   Potassium 3.9 3.5 - 5.1 mmol/L   Chloride 106 101 - 111 mmol/L   CO2 20 (L) 22 - 32 mmol/L   Glucose, Bld 79 65 - 99 mg/dL   BUN 7 6 - 20 mg/dL   Creatinine, Ser 0.87 0.44 - 1.00 mg/dL   Calcium 9.3 8.9 - 10.3 mg/dL   GFR calc non Af Amer >60 >60 mL/min   GFR calc Af Amer >60 >60 mL/min    Comment: (NOTE) The eGFR has been calculated using the CKD EPI equation. This calculation has not been validated in all clinical situations. eGFR's persistently <60 mL/min signify possible Chronic Kidney Disease.    Anion gap 13 5 - 15  Urine rapid drug screen (hosp performed)     Status: None   Collection  Time: 07/22/15  6:00 PM  Result Value Ref Range   Opiates NONE DETECTED NONE DETECTED   Cocaine NONE DETECTED NONE DETECTED   Benzodiazepines NONE DETECTED NONE DETECTED   Amphetamines NONE DETECTED NONE DETECTED   Tetrahydrocannabinol NONE DETECTED NONE DETECTED   Barbiturates NONE DETECTED NONE DETECTED    Comment:        DRUG SCREEN FOR MEDICAL PURPOSES ONLY.  IF CONFIRMATION IS NEEDED FOR ANY PURPOSE, NOTIFY LAB WITHIN 5 DAYS.        LOWEST DETECTABLE LIMITS FOR URINE DRUG SCREEN Drug Class       Cutoff (ng/mL) Amphetamine      1000 Barbiturate      200  Benzodiazepine   161 Tricyclics       096 Opiates          300 Cocaine          300 THC              50   POC Urine Pregnancy, ED (do NOT order at [facility])     Status: None   Collection Time: 07/22/15  6:03 PM  Result Value Ref Range   Preg Test, Ur NEGATIVE NEGATIVE    Comment:        THE SENSITIVITY OF THIS METHODOLOGY IS >24 mIU/mL   Ethanol     Status: None   Collection Time: 07/22/15  8:47 PM  Result Value Ref Range   Alcohol, Ethyl (B) <5 <5 mg/dL    Comment:        LOWEST DETECTABLE LIMIT FOR SERUM ALCOHOL IS 5 mg/dL FOR MEDICAL PURPOSES ONLY     Current Facility-Administered Medications  Medication Dose Route Frequency Provider Last Rate Last Dose  . acetaminophen (TYLENOL) tablet 650 mg  650 mg Oral Q4H PRN Tatyana Kirichenko, PA-C      . diphenhydrAMINE (BENADRYL) capsule 50 mg  50 mg Oral QHS PRN Tatyana Kirichenko, PA-C      . divalproex (DEPAKOTE ER) 24 hr tablet 250 mg  250 mg Oral Daily Tatyana Kirichenko, PA-C   250 mg at 07/23/15 1029  . naproxen (NAPROSYN) tablet 500 mg  500 mg Oral BID Tatyana Kirichenko, PA-C   500 mg at 07/23/15 1029  . ondansetron (ZOFRAN) tablet 4 mg  4 mg Oral Q8H PRN Tatyana Kirichenko, PA-C   4 mg at 07/23/15 0525  . risperiDONE (RISPERDAL) tablet 0.25 mg  0.25 mg Oral QHS Tatyana Kirichenko, PA-C   0.25 mg at 07/22/15 2216  . zolpidem (AMBIEN) tablet 5 mg  5 mg Oral QHS  PRN Jeannett Senior, PA-C   5 mg at 07/22/15 2216   Current Outpatient Prescriptions  Medication Sig Dispense Refill  . diphenhydrAMINE (BENADRYL) 25 MG tablet Take 2 tablets (50 mg total) by mouth at bedtime as needed for sleep. 20 tablet 0  . divalproex (DEPAKOTE ER) 250 MG 24 hr tablet Take 250 mg by mouth daily.     . naproxen (NAPROSYN) 500 MG tablet Take 1 tablet (500 mg total) by mouth 2 (two) times daily. 30 tablet 0  . risperiDONE (RISPERDAL) 0.25 MG tablet Take 0.25 mg by mouth at bedtime.  0  . HYDROcodone-acetaminophen (NORCO/VICODIN) 5-325 MG tablet Take 2 tablets by mouth every 4 (four) hours as needed. (Patient taking differently: Take 2 tablets by mouth every 4 (four) hours as needed for moderate pain. ) 15 tablet 0  . Melatonin 1 MG CAPS Take 1 capsule (1 mg total) by mouth at bedtime as needed. (Patient not taking: Reported on 07/22/2015) 20 capsule 2    Musculoskeletal: Strength & Muscle Tone: within normal limits Gait & Station: normal Patient leans: N/A  Psychiatric Specialty Exam: Review of Systems  Constitutional: Negative.   HENT: Negative.   Eyes: Negative.   Respiratory: Negative.   Cardiovascular: Negative.   Gastrointestinal: Negative.   Genitourinary: Negative.   Musculoskeletal: Negative.   Skin: Negative.   Neurological: Negative.   Endo/Heme/Allergies: Negative.   Psychiatric/Behavioral:       Mania    Blood pressure 115/77, pulse 86, temperature 97.6 F (36.4 C), temperature source Oral, resp. rate 16, last menstrual period 07/17/2015, SpO2 100 %.There is no weight on file to calculate  BMI.  General Appearance: Fairly Groomed  Engineer, water::  Good  Speech:  Pressured  Volume:  Normal  Mood:  Euphoric  Affect:  Congruent  Thought Process:  Circumstantial  Orientation:  Full (Time, Place, and Person)  Thought Content:  WDL  Suicidal Thoughts:  No  Homicidal Thoughts:  No  Memory:  Immediate;   Fair Recent;   Fair Remote;   Fair   Judgement:  Fair  Insight:  Fair  Psychomotor Activity:  Increased  Concentration:  Poor  Recall:  [employer] of Knowledge:Fair  Language: Fair  Akathisia:  No  Handed:  Right  AIMS (if indicated):     Assets:  Communication Skills Desire for Improvement Financial Resources/Insurance Resilience Social Support  ADL's:  Intact  Cognition: WNL  Sleep:      Treatment Plan Summary: Daily contact with patient to assess and evaluate symptoms and progress in treatment, Medication management and Plan : Bipolar I Disorder, current episode manic -Crisis Stabilization -Individual Counseling -Medication Management:  Increase Depakote to 516m BID for mood stabilization  Increase Risperdal 0.2329mQHS to 29m49mHS for mood stabilization  Disposition: Recommend psychiatric Inpatient admission when medically cleared.  LORWaylan BogaP 07/23/2015 11:27 AM

## 2015-08-03 DIAGNOSIS — T2012XA Burn of first degree of lip(s), initial encounter: Secondary | ICD-10-CM | POA: Diagnosis not present

## 2015-08-03 DIAGNOSIS — Z6827 Body mass index (BMI) 27.0-27.9, adult: Secondary | ICD-10-CM | POA: Diagnosis not present

## 2015-08-03 DIAGNOSIS — F310 Bipolar disorder, current episode hypomanic: Secondary | ICD-10-CM | POA: Diagnosis not present

## 2015-08-10 ENCOUNTER — Telehealth: Payer: Self-pay | Admitting: General Practice

## 2015-08-10 ENCOUNTER — Emergency Department (INDEPENDENT_AMBULATORY_CARE_PROVIDER_SITE_OTHER)
Admission: EM | Admit: 2015-08-10 | Discharge: 2015-08-10 | Discharge status: Against Medical Advice | Payer: Commercial Managed Care - HMO | Source: Home / Self Care | Attending: Family Medicine | Admitting: Family Medicine

## 2015-08-10 ENCOUNTER — Encounter (HOSPITAL_COMMUNITY): Payer: Self-pay | Admitting: Emergency Medicine

## 2015-08-10 DIAGNOSIS — R202 Paresthesia of skin: Secondary | ICD-10-CM

## 2015-08-10 NOTE — ED Notes (Signed)
C/o bilateral foot swelling and tingly onset 2-3 days... Also reports pain when bearing wt States she is taking Doxepin and Gabapentin as new meds A&O x4... No acute distress.

## 2015-08-10 NOTE — Telephone Encounter (Signed)
Unfortunately, I have to decline due to full patient panel at this time, thanks

## 2015-08-10 NOTE — ED Notes (Signed)
Pt left after seeing provider.

## 2015-08-10 NOTE — Telephone Encounter (Signed)
Pt's mother Ndeye Dabrowski MRN [id number] states her daughter is needing a PCP asap and is wondering if you can take her on. Please advise

## 2015-08-10 NOTE — ED Provider Notes (Signed)
CSN: [id number]     Arrival date & time 08/10/15  1404 History   First MD Initiated Contact with Patient 08/10/15 1521     Chief Complaint  Patient presents with  . Foot Problem   (Consider location/radiation/quality/duration/timing/severity/associated sxs/prior Treatment) HPI Patient is a 45 y/o female presents with ongoing  Paresthesias in both feet. States she was in psych hold and just got out a couple of days ago. States she was given gabapentin but no relief.of symptoms. Pt wants something done for her feet today. She states she is wearing a larger shoe than her normal 6 and she still has pain.  Past Medical History  Diagnosis Date  . Seizures ([place])   . Alcohol abuse   . Bipolar disorder ([place])   . Depression    Past Surgical History  Procedure Laterality Date  . Back surgery    . Tubal ligation     Family History  Problem Relation Age of Onset  . Diabetes Mother   . Hypertension Mother    Social History  Substance Use Topics  . Smoking status: Never Smoker   . Smokeless tobacco: None  . Alcohol Use: No   OB History    Gravida Para Term Preterm AB TAB SAB Ectopic Multiple Living   2 2 2       2      Review of Systems Bilateral feet tingling Allergies  Review of patient's allergies indicates no known allergies.  Home Medications   Prior to Admission medications   Medication Sig Start Date End Date Taking? Authorizing Provider  divalproex (DEPAKOTE ER) 250 MG 24 hr tablet Take 250 mg by mouth daily.  07/14/15  Yes Historical Provider, MD  doxepin (SINEQUAN) 50 MG capsule Take 50 mg by mouth.   Yes Historical Provider, MD  gabapentin (NEURONTIN) 300 MG capsule Take 300 mg by mouth 3 (three) times daily.   Yes Historical Provider, MD  risperiDONE (RISPERDAL) 0.25 MG tablet Take 0.25 mg by mouth at bedtime. 05/23/15  Yes Historical Provider, MD  diphenhydrAMINE (BENADRYL) 25 MG tablet Take 2 tablets (50 mg total) by mouth at bedtime as needed for sleep. 07/13/15    Shawn C Joy, PA-C  HYDROcodone-acetaminophen (NORCO/VICODIN) 5-325 MG tablet Take 2 tablets by mouth every 4 (four) hours as needed. Patient taking differently: Take 2 tablets by mouth every 4 (four) hours as needed for moderate pain.  07/01/15   Leonard Schwartz, MD  Melatonin 1 MG CAPS Take 1 capsule (1 mg total) by mouth at bedtime as needed. Patient not taking: Reported on 07/22/2015 07/13/15   Shawn C Joy, PA-C  naproxen (NAPROSYN) 500 MG tablet Take 1 tablet (500 mg total) by mouth 2 (two) times daily. 07/17/15   Konrad Felix, PA   Meds Ordered and Administered this Visit  Medications - No data to display  BP 120/80 mmHg  Pulse 99  Temp(Src) 97.8 F (36.6 C) (Oral)  Resp 16  SpO2 100%  LMP 07/17/2015 No data found.   Physical Exam  Constitutional: She appears well-developed and well-nourished.  Pulmonary/Chest: Effort normal.  Neurological: She is alert.  Skin: Skin is warm and dry.  Psychiatric: Her affect is angry. Her speech is rapid and/or pressured.  Nursing note reviewed.   ED Course  Procedures (including critical care time)  Labs Review Labs Reviewed - No data to display  Imaging Review No results found.   Visual Acuity Review  Right Eye Distance:   Left Eye Distance:   Bilateral  Distance:    Right Eye Near:   Left Eye Near:    Bilateral Near:        Pt got up from exam table when informed there were no tests in the UC that could be performed to find an answer to her problem. She then left the room asking for a bus pass.    MDM   1. [place], PA 08/10/15 1857

## 2015-08-13 NOTE — Telephone Encounter (Signed)
Notified mother.  Scheduled with Terri Piedra

## 2015-09-04 ENCOUNTER — Encounter (HOSPITAL_COMMUNITY): Payer: Self-pay | Admitting: Emergency Medicine

## 2015-09-04 ENCOUNTER — Emergency Department (HOSPITAL_COMMUNITY)
Admission: EM | Admit: 2015-09-04 | Discharge: 2015-09-04 | Disposition: A | Discharge status: Home / Self Care | Payer: Commercial Managed Care - HMO | Attending: Emergency Medicine | Admitting: Emergency Medicine

## 2015-09-04 DIAGNOSIS — F319 Bipolar disorder, unspecified: Secondary | ICD-10-CM | POA: Diagnosis not present

## 2015-09-04 DIAGNOSIS — R109 Unspecified abdominal pain: Secondary | ICD-10-CM | POA: Diagnosis present

## 2015-09-04 DIAGNOSIS — Z79899 Other long term (current) drug therapy: Secondary | ICD-10-CM | POA: Insufficient documentation

## 2015-09-04 DIAGNOSIS — Z3202 Encounter for pregnancy test, result negative: Secondary | ICD-10-CM | POA: Insufficient documentation

## 2015-09-04 DIAGNOSIS — N390 Urinary tract infection, site not specified: Secondary | ICD-10-CM | POA: Diagnosis not present

## 2015-09-04 DIAGNOSIS — M545 Low back pain, unspecified: Secondary | ICD-10-CM

## 2015-09-04 DIAGNOSIS — D259 Leiomyoma of uterus, unspecified: Secondary | ICD-10-CM | POA: Diagnosis not present

## 2015-09-04 DIAGNOSIS — R1031 Right lower quadrant pain: Secondary | ICD-10-CM | POA: Diagnosis not present

## 2015-09-04 DIAGNOSIS — G8929 Other chronic pain: Secondary | ICD-10-CM | POA: Diagnosis not present

## 2015-09-04 DIAGNOSIS — R1032 Left lower quadrant pain: Secondary | ICD-10-CM

## 2015-09-04 DIAGNOSIS — Z791 Long term (current) use of non-steroidal anti-inflammatories (NSAID): Secondary | ICD-10-CM | POA: Diagnosis not present

## 2015-09-04 LAB — URINALYSIS, ROUTINE W REFLEX MICROSCOPIC
Bilirubin Urine: NEGATIVE
Glucose, UA: NEGATIVE mg/dL
Hgb urine dipstick: NEGATIVE
Ketones, ur: NEGATIVE mg/dL
Nitrite: NEGATIVE
Protein, ur: NEGATIVE mg/dL
Specific Gravity, Urine: 1.004 — ABNORMAL LOW (ref 1.005–1.030)
pH: 6.5 (ref 5.0–8.0)

## 2015-09-04 LAB — CBC
HCT: 36.7 % (ref 36.0–46.0)
Hemoglobin: 11.7 g/dL — ABNORMAL LOW (ref 12.0–15.0)
MCH: 22 pg — ABNORMAL LOW (ref 26.0–34.0)
MCHC: 31.9 g/dL (ref 30.0–36.0)
MCV: 68.9 fL — ABNORMAL LOW (ref 78.0–100.0)
Platelets: 172 10*3/uL (ref 150–400)
RBC: 5.33 MIL/uL — ABNORMAL HIGH (ref 3.87–5.11)
RDW: 21.6 % — ABNORMAL HIGH (ref 11.5–15.5)
WBC: 6.9 10*3/uL (ref 4.0–10.5)

## 2015-09-04 LAB — PREGNANCY, URINE: Preg Test, Ur: NEGATIVE

## 2015-09-04 LAB — COMPREHENSIVE METABOLIC PANEL
ALT: 12 U/L — ABNORMAL LOW (ref 14–54)
AST: 19 U/L (ref 15–41)
Albumin: 3.1 g/dL — ABNORMAL LOW (ref 3.5–5.0)
Alkaline Phosphatase: 59 U/L (ref 38–126)
Anion gap: 10 (ref 5–15)
BUN: <5 mg/dL — ABNORMAL LOW (ref 6–20)
CO2: 23 mmol/L (ref 22–32)
Calcium: 9 mg/dL (ref 8.9–10.3)
Chloride: 105 mmol/L (ref 101–111)
Creatinine, Ser: 0.95 mg/dL (ref 0.44–1.00)
GFR calc Af Amer: >60 mL/min (ref >60)
GFR calc non Af Amer: >60 mL/min (ref >60)
Glucose, Bld: 72 mg/dL (ref 65–99)
Potassium: 3.8 mmol/L (ref 3.5–5.1)
Sodium: 138 mmol/L (ref 135–145)
Total Bilirubin: 0.2 mg/dL — ABNORMAL LOW (ref 0.3–1.2)
Total Protein: 7.3 g/dL (ref 6.5–8.1)

## 2015-09-04 LAB — URINE MICROSCOPIC-ADD ON

## 2015-09-04 LAB — LIPASE, BLOOD: Lipase: 32 U/L (ref 11–51)

## 2015-09-04 MED ORDER — SULFAMETHOXAZOLE-TRIMETHOPRIM 800-160 MG PO TABS
1.0000 | ORAL_TABLET | Freq: Two times a day (BID) | ORAL | Status: AC
Start: 2015-09-04 — End: 2015-09-11

## 2015-09-04 MED ORDER — IBUPROFEN 600 MG PO TABS: 600.0000 mg | ORAL_TABLET | Freq: Four times a day (QID) | ORAL | Status: DC | PRN

## 2015-09-04 MED ORDER — IBUPROFEN 800 MG PO TABS: 800.0000 mg | ORAL_TABLET | Freq: Three times a day (TID) | ORAL | Status: DC | PRN

## 2015-09-04 NOTE — ED Notes (Signed)
Patient able to ambulate independently  

## 2015-09-04 NOTE — ED Notes (Signed)
Pt states for the last two weeks she has had lower back pain radiating into her mid ABD. Pt reports history of fibroids. Pt denies any vaginal bleeding or discharge. Pt reports frequent urination.

## 2015-09-04 NOTE — Discharge Instructions (Signed)
Read the information below.  Use the prescribed medication as directed.  Please discuss all new medications with your pharmacist.  You may return to the Emergency Department at any time for worsening condition or any new symptoms that concern you.  If you develop high fevers, worsening abdominal pain, uncontrolled vomiting, or are unable to tolerate fluids by mouth, return to the ER for a recheck.      Abdominal Pain, Adult Many things can cause belly (abdominal) pain. Most times, the belly pain is not dangerous. Many cases of belly pain can be watched and treated at home. HOME CARE   Do not take medicines that help you go poop (laxatives) unless told to by your doctor.  Only take medicine as told by your doctor.  Eat or drink as told by your doctor. Your doctor will tell you if you should be on a special diet. GET HELP IF:  You do not know what is causing your belly pain.  You have belly pain while you are sick to your stomach (nauseous) or have runny poop (diarrhea).  You have pain while you pee or poop.  Your belly pain wakes you up at night.  You have belly pain that gets worse or better when you eat.  You have belly pain that gets worse when you eat fatty foods.  You have a fever. GET HELP RIGHT AWAY IF:   The pain does not go away within 2 hours.  You keep throwing up (vomiting).  The pain changes and is only in the right or left part of the belly.  You have bloody or tarry looking poop. MAKE SURE YOU:   Understand these instructions.  Will watch your condition.  Will get help right away if you are not doing well or get worse.   This information is not intended to replace advice given to you by your health care provider. Make sure you discuss any questions you have with your health care provider.   Document Released: 10/29/2007 Document Revised: 06/02/2014 Document Reviewed: 01/19/2013 Elsevier Interactive Patient Education 2016 Elsevier Inc.  Back Pain,  Adult Back pain is very common. The pain often gets better over time. The cause of back pain is usually not dangerous. Most people can learn to manage their back pain on their own.  HOME CARE  Watch your back pain for any changes. The following actions may help to lessen any pain you are feeling:  Stay active. Start with short walks on flat ground if you can. Try to walk farther each day.  Exercise regularly as told by your doctor. Exercise helps your back heal faster. It also helps avoid future injury by keeping your muscles strong and flexible.  Do not sit, drive, or stand in one place for more than 30 minutes.  Do not stay in bed. Resting more than 1-2 days can slow down your recovery.  Be careful when you bend or lift an object. Use good form when lifting:  Bend at your knees.  Keep the object close to your body.  Do not twist.  Sleep on a firm mattress. Lie on your side, and bend your knees. If you lie on your back, put a pillow under your knees.  Take medicines only as told by your doctor.  Put ice on the injured area.  Put ice in a plastic bag.  Place a towel between your skin and the bag.  Leave the ice on for 20 minutes, 2-3 times a day for the  first 2-3 days. After that, you can switch between ice and heat packs.  Avoid feeling anxious or stressed. Find good ways to deal with stress, such as exercise.  Maintain a healthy weight. Extra weight puts stress on your back. GET HELP IF:   You have pain that does not go away with rest or medicine.  You have worsening pain that goes down into your legs or buttocks.  You have pain that does not get better in one week.  You have pain at night.  You lose weight.  You have a fever or chills. GET HELP RIGHT AWAY IF:   You cannot control when you poop (bowel movement) or pee (urinate).  Your arms or legs feel weak.  Your arms or legs lose feeling (numbness).  You feel sick to your stomach (nauseous) or throw up  (vomit).  You have belly (abdominal) pain.  You feel like you may pass out (faint).   This information is not intended to replace advice given to you by your health care provider. Make sure you discuss any questions you have with your health care provider.   Document Released: 10/29/2007 Document Revised: 06/02/2014 Document Reviewed: 09/13/2013 Elsevier Interactive Patient Education [employer].

## 2015-09-04 NOTE — ED Provider Notes (Signed)
CSN: [id number]     Arrival date & time 09/04/15  1237 History   By signing my name below, I, Eustaquio Maize, attest that this documentation has been prepared under the direction and in the presence of [facility], PA-C. Electronically Signed: Eustaquio Maize, ED Scribe. 09/04/2015. 2:45 PM.   Chief Complaint  Patient presents with  . Abdominal Pain   The history is provided by the patient. No language interpreter was used.     HPI Comments: Melissa Dougherty is a 45 y.o. female with PMhx bipolar disorder who presents to the Emergency Department complaining of gradual onset, constant, waxing and waning, sharp,  lower back pain radiating into diffuse abdomen x 3-4 months. Pt reports that 3-4 times per day she has worsening pain to her back and abdomen. There are no modifying factors to the pain. Pt states that she was diagnosed with uterine fibroids in January 2017 (approximately 3 months ago) by PCP, Dr. Criss Rosales, but was not given any pain medication for it. She also complains of weight gain, abdominal distension, urinary frequency x 2-3 months, urgency, prolonged/heavier menstrual periods, and increased bowel movements that occur 2-3 times per day. Pt mentions that she did have nausea and vomiting when the pain first began but states it has resolved now. LNMP: 08/17/2015. She has not taken any medication for her pain. Denies fever, diarrhea, constipation, or any other associated symptoms. PSHx tubal ligation.   Past Medical History  Diagnosis Date  . Seizures ([place])   . Alcohol abuse   . Bipolar disorder ([place])   . Depression    Past Surgical History  Procedure Laterality Date  . Back surgery    . Tubal ligation     Family History  Problem Relation Age of Onset  . Diabetes Mother   . Hypertension Mother    Social History  Substance Use Topics  . Smoking status: Never Smoker   . Smokeless tobacco: None  . Alcohol Use: No   OB History    Gravida Para Term Preterm AB TAB SAB Ectopic  Multiple Living   2 2 2       2      Review of Systems  Constitutional: Positive for unexpected weight change (increased weight gain). Negative for fever and chills.  Gastrointestinal: Positive for abdominal pain and abdominal distention. Negative for nausea, vomiting, diarrhea and constipation.  Genitourinary: Positive for urgency, frequency and vaginal bleeding (prolonged menstrual periods).  Musculoskeletal: Positive for back pain.  All other systems reviewed and are negative.  Allergies  Review of patient's allergies indicates no known allergies.  Home Medications   Prior to Admission medications   Medication Sig Start Date End Date Taking? Authorizing Provider  diphenhydrAMINE (BENADRYL) 25 MG tablet Take 2 tablets (50 mg total) by mouth at bedtime as needed for sleep. 07/13/15   Shawn C Joy, PA-C  divalproex (DEPAKOTE ER) 250 MG 24 hr tablet Take 250 mg by mouth daily.  07/14/15   Historical Provider, MD  doxepin (SINEQUAN) 50 MG capsule Take 50 mg by mouth.    Historical Provider, MD  gabapentin (NEURONTIN) 300 MG capsule Take 300 mg by mouth 3 (three) times daily.    Historical Provider, MD  HYDROcodone-acetaminophen (NORCO/VICODIN) 5-325 MG tablet Take 2 tablets by mouth every 4 (four) hours as needed. Patient taking differently: Take 2 tablets by mouth every 4 (four) hours as needed for moderate pain.  07/01/15   Leonard Schwartz, MD  Melatonin 1 MG CAPS Take 1 capsule (1  mg total) by mouth at bedtime as needed. Patient not taking: Reported on 07/22/2015 07/13/15   Shawn C Joy, PA-C  naproxen (NAPROSYN) 500 MG tablet Take 1 tablet (500 mg total) by mouth 2 (two) times daily. 07/17/15   Konrad Felix, PA  risperiDONE (RISPERDAL) 0.25 MG tablet Take 0.25 mg by mouth at bedtime. 05/23/15   Historical Provider, MD   BP 118/70 mmHg  Pulse 118  Temp(Src) 97.6 F (36.4 C) (Oral)  Resp 16  Ht 5\' 1"  (1.549 m)  Wt 146 lb (66.225 kg)  BMI 27.60 kg/m2  SpO2 95%  LMP 08/21/2015   Physical  Exam  Constitutional: She appears well-developed and well-nourished. No distress.  HENT:  Head: Normocephalic and atraumatic.  Neck: Neck supple.  Cardiovascular: Normal rate and regular rhythm.   Pulmonary/Chest: Effort normal and breath sounds normal. No respiratory distress. She has no wheezes. She has no rales.  Abdominal: Soft. She exhibits no distension and no mass. There is tenderness. There is no rebound and no guarding.  Diffusely tender through the lower abdomen. Complex remote surgical scarring through the left upper quadrant.   Musculoskeletal: She exhibits tenderness.  Diffuse tenderness throughout the lower back.    Neurological: She is alert.  Skin: She is not diaphoretic.  Nursing note and vitals reviewed.   ED Course  Procedures (including critical care time)  DIAGNOSTIC STUDIES: Oxygen Saturation is 95% on RA, adequate by my interpretation.    COORDINATION OF CARE: 2:44 PM-Discussed treatment plan which includes urine pregnancy with pt at bedside and pt agreed to plan.   Labs Review Labs Reviewed  COMPREHENSIVE METABOLIC PANEL - Abnormal; Notable for the following:    BUN <5 (*)    Albumin 3.1 (*)    ALT 12 (*)    Total Bilirubin 0.2 (*)    All other components within normal limits  CBC - Abnormal; Notable for the following:    RBC 5.33 (*)    Hemoglobin 11.7 (*)    MCV 68.9 (*)    MCH 22.0 (*)    RDW 21.6 (*)    All other components within normal limits  URINALYSIS, ROUTINE W REFLEX MICROSCOPIC (NOT AT [facility]) - Abnormal; Notable for the following:    APPearance HAZY (*)    Specific Gravity, Urine 1.004 (*)    Leukocytes, UA LARGE (*)    All other components within normal limits  URINE MICROSCOPIC-ADD ON - Abnormal; Notable for the following:    Squamous Epithelial / LPF 6-30 (*)    Bacteria, UA FEW (*)    All other components within normal limits  URINE CULTURE  LIPASE, BLOOD  PREGNANCY, URINE  I-STAT BETA HCG BLOOD, ED ([facility], [facility], [facility] ONLY)     Imaging Review No results found. I have personally reviewed and evaluated these lab results as part of my medical decision-making.   EKG Interpretation None      MDM   Final diagnoses:  Abdominal pain, chronic, bilateral lower quadrant  Uterine leiomyoma, unspecified location  Chronic low back pain  UTI (lower urinary tract infection)   Afebrile, nontoxic patient with lower back and abdominal pain x several months.  She does admit to urinary symptoms as well as ongoing chronic long heavy periods.  No bowel changes or vomiting.  No fevers.  She has been seen at ED and by PCP for these symptoms as well.  UA may be infected, given symptoms will treat for UTI.  Pt believes pain is related to  growing fibroids.   D/C home with referral to GYN, abx, symptomatic medications.  Discussed result, findings, treatment, and follow up  with patient.  Pt given return precautions.  Pt verbalizes understanding and agrees with plan.        I personally performed the services described in this documentation, which was scribed in my presence. The recorded information has been reviewed and is accurate.      Clayton Bibles, PA-C 09/04/15 1559  Wandra Arthurs, MD 09/05/15 [phone]

## 2015-09-06 LAB — URINE CULTURE

## 2015-09-18 ENCOUNTER — Ambulatory Visit: Payer: Self-pay | Admitting: Family

## 2015-09-26 ENCOUNTER — Encounter: Payer: Self-pay | Admitting: Obstetrics & Gynecology

## 2015-09-26 ENCOUNTER — Other Ambulatory Visit (HOSPITAL_COMMUNITY)
Admission: RE | Admit: 2015-09-26 | Discharge: 2015-09-26 | Disposition: A | Discharge status: Home / Self Care | Payer: Commercial Managed Care - HMO | Source: Ambulatory Visit | Attending: Obstetrics & Gynecology | Admitting: Obstetrics & Gynecology

## 2015-09-26 ENCOUNTER — Ambulatory Visit (INDEPENDENT_AMBULATORY_CARE_PROVIDER_SITE_OTHER): Payer: Commercial Managed Care - HMO | Admitting: Obstetrics & Gynecology

## 2015-09-26 VITALS — BP 126/87 | HR 88 | Wt 149.8 lb

## 2015-09-26 DIAGNOSIS — Z124 Encounter for screening for malignant neoplasm of cervix: Secondary | ICD-10-CM

## 2015-09-26 DIAGNOSIS — Z1151 Encounter for screening for human papillomavirus (HPV): Secondary | ICD-10-CM | POA: Insufficient documentation

## 2015-09-26 DIAGNOSIS — R87610 Atypical squamous cells of undetermined significance on cytologic smear of cervix (ASC-US): Secondary | ICD-10-CM | POA: Diagnosis not present

## 2015-09-26 DIAGNOSIS — Z01411 Encounter for gynecological examination (general) (routine) with abnormal findings: Secondary | ICD-10-CM | POA: Insufficient documentation

## 2015-09-26 DIAGNOSIS — D259 Leiomyoma of uterus, unspecified: Secondary | ICD-10-CM | POA: Diagnosis not present

## 2015-09-26 LAB — POCT URINALYSIS DIP (DEVICE)
Bilirubin Urine: NEGATIVE
Glucose, UA: NEGATIVE mg/dL
Hgb urine dipstick: NEGATIVE
Ketones, ur: NEGATIVE mg/dL
Nitrite: NEGATIVE
Protein, ur: NEGATIVE mg/dL
Specific Gravity, Urine: 1.015 (ref 1.005–1.030)
Urobilinogen, UA: 0.2 mg/dL (ref 0.0–1.0)
pH: 7 (ref 5.0–8.0)

## 2015-09-26 LAB — POCT PREGNANCY, URINE: Preg Test, Ur: NEGATIVE

## 2015-09-26 NOTE — Patient Instructions (Signed)
Sonohysterogram A sonohysterogram is a procedure to examine the inside of your uterus. This exam uses sound waves sent to a computer to make real-time pictures of the inside of your uterus. To get the best images, a germ-free, saltwater solution (sterile saline) is injected into your uterus through your vagina. A sonohysterogram can show whether there is scarring or abnormal growths inside your uterus. It can also show whether your uterus is an abnormal shape or whether the lining is too thin.  LET YOUR HEALTH CARE PROVIDER KNOW ABOUT:  Any allergies you have.  All medicines you are taking, including vitamins, herbs, eyedrops, creams, and over-the-counter medicines.  Previous problems you or members of your family have had with the use of anesthetics.  Any blood disorders you have.  Previous surgeries you have had.  Medical conditions you have.  The dates of your last period.  Possibility of pregnancy. RISKS AND COMPLICATIONS Generally, a sonohysterogram is a safe procedure. However, as with any procedure, problems can occur. Possible problems include:  Bleeding.  Infection. BEFORE THE PROCEDURE  Your health care provider may have you take an over-the-counter pain medicine.  You may get a prescription for antibiotic medicine.  Your health care provider may give you a pregnancy test before the procedure.  You will empty your bladder. PROCEDURE   You will lie down on the examining table with your knees raised or your feet in stirrups.  Your health care provider may do a pelvic exam before starting the procedure.  A slender, handheld device (transducer) will be lubricated and placed into your vagina.  The transducer will be positioned to send sound waves to your uterus.  The sound waves will bounce back to the transducer. They will be sent to a computer.  The computer will turn the sound waves into live images.  Your health care provider will view the images on a screen  during the procedure.  Your health care provider will remove the transducer from your vagina and use an instrument to widen the opening (speculum).  A swab will be used to clean the opening to your uterus (cervix).  A long, thin tube (catheter) will then be placed through your cervix into your uterus.  Your health care provider will fill your uterus with sterile saline through the catheter. You may feel some cramping.  The speculum will be removed.  The transducer will be placed back in your vagina to take more images. AFTER THE PROCEDURE After the procedure, it is typical to have light bleeding from your vagina, cramping, and watery vaginal discharge.    This information is not intended to replace advice given to you by your health care provider. Make sure you discuss any questions you have with your health care provider.   Document Released: 09/26/2013 Document Reviewed: 09/26/2013 Elsevier Interactive Patient Education 2016 [employer]vier Inc.  

## 2015-09-26 NOTE — Progress Notes (Signed)
Patient ID: Melissa Dougherty, female   DOB: 07-10-1970, 45 y.o.   MRN: [id number]  Chief Complaint  Patient presents with  . Menorrhagia    fibroids    HPI Melissa Dougherty is a 45 y.o. female.  [id number] Patient's last menstrual period was 09/16/2015 (approximate).   HPI    Melissa Dougherty, a 45 year old G2P2, presents with increased vaginal bleeding. Patient reports that over the last several months her periods have increased from 4 to 7 days and the quantity of bleeding has increased to the point that she is changing pads every hour. Patient reports visiting the ER several months ago for continued N/V and diarrhea at which they discovered possible uterine masses during abdominal ultrasound. Today, patient reports fatigue, constipation, lower back pain, and increased urinary frequency.  Past Medical History  Diagnosis Date  . Seizures ([place])   . Alcohol abuse   . Bipolar disorder ([place])   . Depression     Past Surgical History  Procedure Laterality Date  . Back surgery    . Tubal ligation      Family History  Problem Relation Age of Onset  . Diabetes Mother   . Hypertension Mother     Social History Social History  Substance Use Topics  . Smoking status: Never Smoker   . Smokeless tobacco: None  . Alcohol Use: No    No Known Allergies  Current Outpatient Prescriptions  Medication Sig Dispense Refill  . divalproex (DEPAKOTE ER) 250 MG 24 hr tablet Take 250 mg by mouth daily.     Marland Kitchen gabapentin (NEURONTIN) 300 MG capsule Take 300 mg by mouth 3 (three) times daily.    . risperiDONE (RISPERDAL) 0.25 MG tablet Take 0.25 mg by mouth at bedtime.  0  . diphenhydrAMINE (BENADRYL) 25 MG tablet Take 2 tablets (50 mg total) by mouth at bedtime as needed for sleep. (Patient not taking: Reported on 09/26/2015) 20 tablet 0  . doxepin (SINEQUAN) 50 MG capsule Take 50 mg by mouth. Reported on 09/26/2015    . HYDROcodone-acetaminophen (NORCO/VICODIN) 5-325 MG tablet Take 2 tablets by mouth every  4 (four) hours as needed. (Patient not taking: Reported on 09/26/2015) 15 tablet 0  . ibuprofen (ADVIL,MOTRIN) 600 MG tablet Take 1 tablet (600 mg total) by mouth every 6 (six) hours as needed for mild pain or moderate pain. (Patient not taking: Reported on 09/26/2015) 15 tablet 0  . Melatonin 1 MG CAPS Take 1 capsule (1 mg total) by mouth at bedtime as needed. (Patient not taking: Reported on 07/22/2015) 20 capsule 2   No current facility-administered medications for this visit.    Review of Systems Review of Systems  Constitutional: Positive for fatigue. Negative for fever.  Respiratory: Negative for shortness of breath.   Cardiovascular: Negative for chest pain, palpitations and leg swelling.  Gastrointestinal: Positive for constipation. Negative for nausea, vomiting, abdominal pain, diarrhea and blood in stool.  Genitourinary: Positive for frequency and pelvic pain. Negative for dysuria, urgency, hematuria, flank pain and decreased urine volume.  Musculoskeletal: Positive for back pain. Negative for myalgias.  Neurological: Negative for dizziness, syncope, light-headedness and headaches.    Blood pressure 126/87, pulse 88, weight 149 lb 12.8 oz (67.949 kg), last menstrual period 09/16/2015.  Physical Exam Physical Exam  Constitutional: She appears well-developed and well-nourished.  Abdominal: Soft. She exhibits distension. She exhibits no mass. There is tenderness. There is no rebound and no guarding.  Genitourinary: Vagina normal. No vaginal discharge found.  Cervical: no masses palpated, slight right-sided tenderness No masses Data Reviewed We examined her prior ultrasound and 2015 pap-smear findings.  CLINICAL DATA: Patient with abnormal CT demonstrating probable mass within the endometrium.  EXAM: TRANSABDOMINAL AND TRANSVAGINAL ULTRASOUND OF PELVIS  TECHNIQUE: Both transabdominal and transvaginal ultrasound examinations of the pelvis were performed. Transabdominal  technique was performed for global imaging of the pelvis including uterus, ovaries, adnexal regions, and pelvic cul-de-sac. It was necessary to proceed with endovaginal exam following the transabdominal exam to visualize the endometrium.  COMPARISON: CT abdomen pelvis 07/01/2015  FINDINGS: Uterus  Measurements: 10.0 x 4.8 x 6.7 cm. Within the anterior uterine body there is a 1.7 x 1.7 x 1.8 cm fibroid. Within the posterior uterine body there is a 1.5 x 0.9 x 1.6 cm intramural fibroid.  Endometrium  There is a 3.6 x 1.7 x 2.5 cm solid mass within the endometrium with internal color vascular flow.  Right ovary  Measurements: 2.8 x 1.8 x 2.0 cm. Normal appearance/no adnexal mass.  Left ovary  Measurements: 2.7 x 1.5 x 1.9 cm. Normal appearance/no adnexal mass.  Other findings  No abnormal free fluid.  IMPRESSION: There is a 2.8 cm mass within the endometrium. This is nonspecific and may represent endometrial polyp, fibroid, focal endometrial hyperplasia or endometrial carcinoma. Consider sonohysterogram for further evaluation, prior to hysteroscopy or endometrial biopsy.   Electronically Signed  By: Lovey Newcomer M.D.  On: 07/01/2015 16:21  Assessment/ Plan Melissa Dougherty, a [id number] G2P2, who presents with increased duration and bleeding during menstrual periods with suspected uterine lesion on prior ultrasound may have fibroids, polyps or endometrial hyperplasia. Rather than performing a biopsy today, we have scheduled her to receive a saline-infusion sonohysterogram in order to determine the underlying etiology. Given the lack of symptom severity and duration of bleeding, we will defer medical or surgical management until we receive the [facility] results.  Given the findings of ASCUS on 2015 pap (reflex HPV DNA testing negative for high risk HPV) we performed pap smear today and will notify her of results.    Melissa Dougherty, [place] 09/26/2015, 2:13 PM  Attestation of  Attending Supervision of medical student: Evaluation and management procedures were performed by the student under my supervision and collaboration.  I have reviewed the student's note and chart, and I agree with the management and plan.  Emeterio Reeve, MD, [place] Attending [place], [facility]

## 2015-09-26 NOTE — Progress Notes (Deleted)
Patient ID: Melissa Dougherty, female   DOB: [birth year], 45 y.o.   MRN: [id number]  Chief Complaint  Patient presents with  . Menorrhagia    fibroids    HPI Melissa Dougherty is a 45 y.o. female.  *** HPI   Melissa Dougherty, a 45 year old G2P2, presents with increased vaginal bleeding. Patient reports that over the last several months her periods have increased from 4 to 7 days and the quantity of bleeding has increased to the point that she is changing pads every hour. Patient reports visiting the ER several months ago for continued N/V and diarrhea at which they discovered possible uterine masses during abdominal ultrasound. Today, patient reports fatigue, constipation, lower back pain, and increased urinary frequency.    Past Medical History  Diagnosis Date  . Seizures ([place])   . Alcohol abuse   . Bipolar disorder ([place])   . Depression     Past Surgical History  Procedure Laterality Date  . Back surgery    . Tubal ligation      Family History  Problem Relation Age of Onset  . Diabetes Mother   . Hypertension Mother     Social History Social History  Substance Use Topics  . Smoking status: Never Smoker   . Smokeless tobacco: None  . Alcohol Use: No    No Known Allergies  Current Outpatient Prescriptions  Medication Sig Dispense Refill  . divalproex (DEPAKOTE ER) 250 MG 24 hr tablet Take 250 mg by mouth daily.     Marland Kitchen gabapentin (NEURONTIN) 300 MG capsule Take 300 mg by mouth 3 (three) times daily.    . risperiDONE (RISPERDAL) 0.25 MG tablet Take 0.25 mg by mouth at bedtime.  0  . diphenhydrAMINE (BENADRYL) 25 MG tablet Take 2 tablets (50 mg total) by mouth at bedtime as needed for sleep. (Patient not taking: Reported on 09/26/2015) 20 tablet 0  . doxepin (SINEQUAN) 50 MG capsule Take 50 mg by mouth. Reported on 09/26/2015    . HYDROcodone-acetaminophen (NORCO/VICODIN) 5-325 MG tablet Take 2 tablets by mouth every 4 (four) hours as needed. (Patient not taking: Reported on 09/26/2015)  15 tablet 0  . ibuprofen (ADVIL,MOTRIN) 600 MG tablet Take 1 tablet (600 mg total) by mouth every 6 (six) hours as needed for mild pain or moderate pain. (Patient not taking: Reported on 09/26/2015) 15 tablet 0  . Melatonin 1 MG CAPS Take 1 capsule (1 mg total) by mouth at bedtime as needed. (Patient not taking: Reported on 07/22/2015) 20 capsule 2   No current facility-administered medications for this visit.    Review of Systems Review of Systems     Blood pressure 126/87, pulse 88, weight 149 lb 12.8 oz (67.949 kg), last menstrual period 09/16/2015.  Physical Exam Physical Exam  Data Reviewed ***  Assessment/Plan   Melissa Dougherty, a 45 yo G2P2, who presents with increased vaginal bleeding during her menstrual period with suspected uterine lesion on prior ultrasound may have fibroids, polyp, or another form of endometrial hyperplasia. We have scheduled her to receive a saline-infusion sonohysterogram, rather than carrying out a biopsy this afternoon, to determine the underlying etiology. Given her findings of ASCUS on 2015 papsmear (with reflex HPV DNA testing negative for high risk virus), we also        Melissa Dougherty Melissa Dougherty 09/26/2015, 2:02 PM

## 2015-09-27 LAB — CYTOLOGY - PAP

## 2015-09-29 ENCOUNTER — Encounter (HOSPITAL_COMMUNITY): Payer: Self-pay

## 2015-09-29 ENCOUNTER — Emergency Department (HOSPITAL_COMMUNITY)
Admission: EM | Admit: 2015-09-29 | Discharge: 2015-09-29 | Disposition: A | Discharge status: Home / Self Care | Payer: Commercial Managed Care - HMO | Attending: Emergency Medicine | Admitting: Emergency Medicine

## 2015-09-29 DIAGNOSIS — M549 Dorsalgia, unspecified: Secondary | ICD-10-CM | POA: Diagnosis present

## 2015-09-29 DIAGNOSIS — M545 Low back pain, unspecified: Secondary | ICD-10-CM

## 2015-09-29 DIAGNOSIS — R1084 Generalized abdominal pain: Secondary | ICD-10-CM | POA: Diagnosis not present

## 2015-09-29 DIAGNOSIS — Z79899 Other long term (current) drug therapy: Secondary | ICD-10-CM | POA: Diagnosis not present

## 2015-09-29 DIAGNOSIS — Z8659 Personal history of other mental and behavioral disorders: Secondary | ICD-10-CM | POA: Diagnosis not present

## 2015-09-29 DIAGNOSIS — R109 Unspecified abdominal pain: Secondary | ICD-10-CM

## 2015-09-29 DIAGNOSIS — E860 Dehydration: Secondary | ICD-10-CM | POA: Insufficient documentation

## 2015-09-29 DIAGNOSIS — Z3202 Encounter for pregnancy test, result negative: Secondary | ICD-10-CM | POA: Insufficient documentation

## 2015-09-29 LAB — COMPREHENSIVE METABOLIC PANEL
ALT: 13 U/L — ABNORMAL LOW (ref 14–54)
AST: 22 U/L (ref 15–41)
Albumin: 3.4 g/dL — ABNORMAL LOW (ref 3.5–5.0)
Alkaline Phosphatase: 66 U/L (ref 38–126)
Anion gap: 10 (ref 5–15)
BUN: <5 mg/dL — ABNORMAL LOW (ref 6–20)
CO2: 26 mmol/L (ref 22–32)
Calcium: 9.1 mg/dL (ref 8.9–10.3)
Chloride: 102 mmol/L (ref 101–111)
Creatinine, Ser: 1.36 mg/dL — ABNORMAL HIGH (ref 0.44–1.00)
GFR calc Af Amer: 54 mL/min — ABNORMAL LOW (ref >60)
GFR calc non Af Amer: 47 mL/min — ABNORMAL LOW (ref >60)
Glucose, Bld: 90 mg/dL (ref 65–99)
Potassium: 4.3 mmol/L (ref 3.5–5.1)
Sodium: 138 mmol/L (ref 135–145)
Total Bilirubin: 0.4 mg/dL (ref 0.3–1.2)
Total Protein: 7.6 g/dL (ref 6.5–8.1)

## 2015-09-29 LAB — URINALYSIS, ROUTINE W REFLEX MICROSCOPIC
Bilirubin Urine: NEGATIVE
Glucose, UA: NEGATIVE mg/dL
Hgb urine dipstick: NEGATIVE
Ketones, ur: NEGATIVE mg/dL
Leukocytes, UA: NEGATIVE
Nitrite: NEGATIVE
Protein, ur: NEGATIVE mg/dL
Specific Gravity, Urine: 1.007 (ref 1.005–1.030)
pH: 7.5 (ref 5.0–8.0)

## 2015-09-29 LAB — I-STAT CHEM 8, ED
BUN: <3 mg/dL — ABNORMAL LOW (ref 6–20)
Calcium, Ion: 0.79 mmol/L — ABNORMAL LOW (ref 1.12–1.23)
Chloride: 108 mmol/L (ref 101–111)
Creatinine, Ser: 0.7 mg/dL (ref 0.44–1.00)
Glucose, Bld: 83 mg/dL (ref 65–99)
HCT: 39 % (ref 36.0–46.0)
Hemoglobin: 13.3 g/dL (ref 12.0–15.0)
Potassium: 4.2 mmol/L (ref 3.5–5.1)
Sodium: 136 mmol/L (ref 135–145)
TCO2: 16 mmol/L (ref 0–100)

## 2015-09-29 LAB — I-STAT BETA HCG BLOOD, ED (MC, WL, AP ONLY): I-stat hCG, quantitative: <5 m[IU]/mL (ref <5)

## 2015-09-29 LAB — LIPASE, BLOOD: Lipase: 35 U/L (ref 11–51)

## 2015-09-29 LAB — CBC
HCT: 38.4 % (ref 36.0–46.0)
Hemoglobin: 12.3 g/dL (ref 12.0–15.0)
MCH: 22.5 pg — ABNORMAL LOW (ref 26.0–34.0)
MCHC: 32 g/dL (ref 30.0–36.0)
MCV: 70.3 fL — ABNORMAL LOW (ref 78.0–100.0)
Platelets: 173 10*3/uL (ref 150–400)
RBC: 5.46 MIL/uL — ABNORMAL HIGH (ref 3.87–5.11)
RDW: 19.7 % — ABNORMAL HIGH (ref 11.5–15.5)
WBC: 4.3 10*3/uL (ref 4.0–10.5)

## 2015-09-29 MED ORDER — SODIUM CHLORIDE 0.9 % IV BOLUS (SEPSIS)
1000.0000 mL | Freq: Once | INTRAVENOUS | Status: AC
  Administered 2015-09-29: 1000 mL via INTRAVENOUS

## 2015-09-29 NOTE — ED Provider Notes (Signed)
CSN: [id number]     Arrival date & time 09/29/15  1118 History   First MD Initiated Contact with Patient 09/29/15 1459     Chief Complaint  Patient presents with  . Abdominal Pain  . Back Pain     (Consider location/radiation/quality/duration/timing/severity/associated sxs/prior Treatment) HPI Melissa Dougherty is a 45 y.o. female history of seizures, alcohol abuse, bipolar disorder, comes in for evaluation of abdominal and back pain. Patient reports she was evaluated in the emergency department sometime ago and found to have fibroids. She reports she is just recently [facility]. She reports abdominal and back discomfort ongoing for the past 2 weeks that she says is "intermittent and constant". She cannot further characterize her discomfort. Nothing seems to make the problem better or worse. She tried magnesium citrate because she thought she had "locked bowels", her bowels have been moving successfully, but does not relieve her discomfort. Denies any fevers, chills, nausea or vomiting, urinary symptoms-although she does report she has been peeing more frequently, last normal period April 16.  Past Medical History  Diagnosis Date  . Seizures ([place])   . Alcohol abuse   . Bipolar disorder ([place])   . Depression    Past Surgical History  Procedure Laterality Date  . Back surgery    . Tubal ligation     Family History  Problem Relation Age of Onset  . Diabetes Mother   . Hypertension Mother    Social History  Substance Use Topics  . Smoking status: Never Smoker   . Smokeless tobacco: None  . Alcohol Use: No   OB History    Gravida Para Term Preterm AB TAB SAB Ectopic Multiple Living   2 2 2       2      Review of Systems A 10 point review of systems was completed and was negative except for pertinent positives and negatives as mentioned in the history of present illness     Allergies  Review of patient's allergies indicates no known  allergies.  Home Medications   Prior to Admission medications   Medication Sig Start Date End Date Taking? Authorizing Provider  divalproex (DEPAKOTE ER) 500 MG 24 hr tablet Take 500 mg by mouth at bedtime. 08/30/15  Yes Historical Provider, MD  gabapentin (NEURONTIN) 300 MG capsule Take 300 mg by mouth at bedtime.    Yes Historical Provider, MD  ibuprofen (ADVIL,MOTRIN) 600 MG tablet Take 1 tablet (600 mg total) by mouth every 6 (six) hours as needed for mild pain or moderate pain. 09/04/15  Yes Clayton Bibles, PA-C  Multiple Vitamin (MULTIVITAMIN WITH MINERALS) TABS tablet Take 1 tablet by mouth daily.   Yes Historical Provider, MD  risperiDONE (RISPERDAL) 1 MG tablet Take 1 mg by mouth at bedtime. 08/30/15  Yes Historical Provider, MD  diphenhydrAMINE (BENADRYL) 25 MG tablet Take 2 tablets (50 mg total) by mouth at bedtime as needed for sleep. Patient not taking: Reported on 09/26/2015 07/13/15   Lorayne Bender, PA-C  HYDROcodone-acetaminophen (NORCO/VICODIN) 5-325 MG tablet Take 2 tablets by mouth every 4 (four) hours as needed. Patient not taking: Reported on 09/26/2015 07/01/15   Leonard Schwartz, MD  Melatonin 1 MG CAPS Take 1 capsule (1 mg total) by mouth at bedtime as needed. Patient not taking: Reported on 07/22/2015 07/13/15   Shawn C Joy, PA-C   BP 121/90 mmHg  Pulse 80  Temp(Src) 97.9 F (36.6 C) (Oral)  Resp 18  Ht 5\' 1"  (1.549 m)  Wt 67.586 kg  BMI 28.17 kg/m2  SpO2 100%  LMP 09/16/2015 (Approximate) Physical Exam  Constitutional: She is oriented to person, place, and time. She appears well-developed and well-nourished.  HENT:  Head: Normocephalic and atraumatic.  Mouth/Throat: Oropharynx is clear and moist.  Eyes: Conjunctivae are normal. Pupils are equal, round, and reactive to light. Right eye exhibits no discharge. Left eye exhibits no discharge. No scleral icterus.  Neck: Neck supple.  Cardiovascular: Normal rate, regular rhythm and normal heart sounds.   Pulmonary/Chest: Effort  normal and breath sounds normal. No respiratory distress. She has no wheezes. She has no rales.  Abdominal: Soft. There is no tenderness.  Mild, diffuse lower abdominal discomfort with deep palpation. No distention, rebound or guarding. No peritoneal signs.  Musculoskeletal: She exhibits no tenderness.  Low back discomfort is replicated with palpation of paraspinal lumbar musculature. No midline bony tenderness, crepitus or other abnormalities noted  Neurological: She is alert and oriented to person, place, and time.  Cranial Nerves II-XII grossly intact  Skin: Skin is warm and dry. No rash noted.  Psychiatric: She has a normal mood and affect.  Nursing note and vitals reviewed.   ED Course  Procedures (including critical care time) Labs Review Labs Reviewed  COMPREHENSIVE METABOLIC PANEL - Abnormal; Notable for the following:    BUN <5 (*)    Creatinine, Ser 1.36 (*)    Albumin 3.4 (*)    ALT 13 (*)    GFR calc non Af Amer 47 (*)    GFR calc Af Amer 54 (*)    All other components within normal limits  CBC - Abnormal; Notable for the following:    RBC 5.46 (*)    MCV 70.3 (*)    MCH 22.5 (*)    RDW 19.7 (*)    All other components within normal limits  URINALYSIS, ROUTINE W REFLEX MICROSCOPIC (NOT AT [facility]) - Abnormal; Notable for the following:    APPearance CLOUDY (*)    All other components within normal limits  I-STAT CHEM 8, ED - Abnormal; Notable for the following:    BUN <3 (*)    Calcium, Ion 0.79 (*)    All other components within normal limits  LIPASE, BLOOD  I-STAT BETA HCG BLOOD, ED ([facility], [facility], [facility] ONLY)    Imaging Review No results found. I have personally reviewed and evaluated these images and lab results as part of my medical decision-making.   EKG Interpretation None     Filed Vitals:   09/29/15 1600 09/29/15 1700 09/29/15 1730 09/29/15 1745  BP: 110/85 104/80 112/71 121/90  Pulse: 77 80 83 80  Temp:      TempSrc:      Resp:      Height:       Weight:      SpO2: 96% 100% 100% 100%  .  MDM  Melissa Dougherty is a 45 y.o. female here for evaluation of abdominal and back discomfort for the past 2 weeks. Patient reports she has a history of fibroids. On arrival she is hemodynamically stable and afebrile. Physical exam is grossly unremarkable with a benign abdominal exam. Discomfort is replicated with palpation of paraspinal musculature. Suspect lumbosacral strain. Unclear abdominal pain etiology, but no peritoneal signs or other evidence of acute abdomen. Screening labs ordered in triage are significant for a creatinine of 1.36, up from her baseline of 0.9. She admits that she has not been drinking very much  water, suspect some element of dehydration. Plan to give saline bolus and recheck. No evidence of urinary infection, pregnancy is negative. Labs are otherwise noncontributory. Overall, patient appears very well, nontoxic, hemodynamically stable and appropriate for outpatient follow-up. Given referral to PCP. Recheck of creatinine shows improvement to 0.70. Final diagnoses:  Mild dehydration  Abdominal discomfort  Bilateral low back pain without sciatica        Comer Locket, PA-C 09/29/15 1839  Davonna Belling, MD 09/29/15 2342

## 2015-09-29 NOTE — ED Notes (Signed)
Pt stable, ambulatory, states understanding of discharge instructions 

## 2015-09-29 NOTE — ED Notes (Signed)
Patient here with lower abdominal pain and back pain x 1 week. Reports constipation and tried mag citrate with bowel movement but no relief from the pain. No distress

## 2015-09-29 NOTE — Discharge Instructions (Signed)
There does not appear to be an emergent cause for your symptoms at this time. It is important for you to follow-up with your doctor or the [facility] in order to find a primary care doctor. It is also important to stay well hydrated and drink lots of water, at least 8, 8 ounce glasses per day. Return to ED for any new or worsening symptoms as we discussed.

## 2015-10-04 ENCOUNTER — Telehealth: Payer: Self-pay | Admitting: General Practice

## 2015-10-04 NOTE — Telephone Encounter (Signed)
Per Dr Roselie Awkward, patient's pap shows ASCUS but negative hpv. Repeat pap in 3 years. Called patient & informed her of results & recommendations. Patient verbalized understanding & had no questions

## 2015-10-12 DIAGNOSIS — F1020 Alcohol dependence, uncomplicated: Secondary | ICD-10-CM | POA: Diagnosis not present

## 2015-10-16 ENCOUNTER — Encounter: Payer: Self-pay | Admitting: Family

## 2015-10-16 ENCOUNTER — Ambulatory Visit (INDEPENDENT_AMBULATORY_CARE_PROVIDER_SITE_OTHER): Payer: Commercial Managed Care - HMO | Admitting: Family

## 2015-10-16 VITALS — BP 126/78 | HR 80 | Temp 98.7°F | Resp 16 | Ht 61.0 in | Wt 152.0 lb

## 2015-10-16 DIAGNOSIS — F3110 Bipolar disorder, current episode manic without psychotic features, unspecified: Secondary | ICD-10-CM | POA: Diagnosis not present

## 2015-10-16 DIAGNOSIS — R140 Abdominal distension (gaseous): Secondary | ICD-10-CM

## 2015-10-16 NOTE — Progress Notes (Signed)
Subjective:    Patient ID: Melissa Dougherty, female    DOB: [birth year], 45 y.o.   MRN: [id number]  Chief Complaint  Patient presents with  . Establish Care    has issues with stomach bloating, have BMs regularly     HPI:  Melissa Dougherty is a 45 y.o. female who  has a past medical history of Bipolar disorder ([place]); Depression; Alcohol abuse; Drug abuse; GERD (gastroesophageal reflux disease); Allergy; Heart murmur; and Seizures ([place]). and presents today for an office visit to establish care.   1.) Stomach issues - This is a new problem. Associated symptom of bloating located in her stomach has been going on for about 1 month. Denies any pain. Reports that she has normal bowel movements that does not improve the symptoms. Frequency of bowel movements are about 1 time per day. Denies any nausea, vomiting, or diarrhea. Modifying factors include magnesium citrate which helps her to have a bowel movement. Appetite remains good.   2.) Bipolar - Currently maintained on risperidone and depatote ER. Reports taking the medication as prescribed and denies adverse side effects. Notes that her mood is generally well balanced with the current medications.   No Known Allergies   Outpatient Prescriptions Prior to Visit  Medication Sig Dispense Refill  . divalproex (DEPAKOTE ER) 500 MG 24 hr tablet Take 500 mg by mouth at bedtime.  0  . gabapentin (NEURONTIN) 300 MG capsule Take 300 mg by mouth at bedtime.     . Multiple Vitamin (MULTIVITAMIN WITH MINERALS) TABS tablet Take 1 tablet by mouth daily.    . risperiDONE (RISPERDAL) 1 MG tablet Take 1 mg by mouth at bedtime.  0  . HYDROcodone-acetaminophen (NORCO/VICODIN) 5-325 MG tablet Take 2 tablets by mouth every 4 (four) hours as needed. 15 tablet 0  . diphenhydrAMINE (BENADRYL) 25 MG tablet Take 2 tablets (50 mg total) by mouth at bedtime as needed for sleep. (Patient not taking: Reported on 09/26/2015) 20 tablet 0  . ibuprofen (ADVIL,MOTRIN) 600 MG  tablet Take 1 tablet (600 mg total) by mouth every 6 (six) hours as needed for mild pain or moderate pain. 15 tablet 0  . Melatonin 1 MG CAPS Take 1 capsule (1 mg total) by mouth at bedtime as needed. (Patient not taking: Reported on 07/22/2015) 20 capsule 2   No facility-administered medications prior to visit.     Past Medical History  Diagnosis Date  . Bipolar disorder ([place])   . Depression   . Alcohol abuse   . Drug abuse   . GERD (gastroesophageal reflux disease)   . Allergy   . Heart murmur   . Seizures ([place])     Last seizure was in 1989     Past Surgical History  Procedure Laterality Date  . Back surgery    . Tubal ligation       Family History  Problem Relation Age of Onset  . Diabetes Mother   . Hypertension Mother   . Alcohol abuse Father      Social History   Social History  . Marital Status: Single    Spouse Name: N/A  . Number of Children: 2  . Years of Education: 11   Occupational History  . Disability     Epilepsy, Mental Health Issues   Social History Main Topics  . Smoking status: Never Smoker   . Smokeless tobacco: Never Used  . Alcohol Use: 0.6 - 1.2 oz/week    1-2 Glasses of wine per  week  . Drug Use: No  . Sexual Activity: Not Currently    Birth Control/ Protection: Surgical   Other Topics Concern  . Not on file   Social History Narrative   Fun: Go shopping   Denies abuse and feels safe at home.       Review of Systems  Constitutional: Negative for fever and chills.  Respiratory: Negative for chest tightness and shortness of breath.   Cardiovascular: Negative for chest pain, palpitations and leg swelling.  Gastrointestinal: Negative for nausea, vomiting, abdominal pain, diarrhea, constipation and blood in stool.  Psychiatric/Behavioral: Negative for suicidal ideas, sleep disturbance and dysphoric mood. The patient is not nervous/anxious.       Objective:    BP 126/78 mmHg  Pulse 80  Temp(Src) 98.7 F (37.1 C) (Oral)   Resp 16  Ht 5\' 1"  (1.549 m)  Wt 152 lb (68.947 kg)  BMI 28.74 kg/m2  SpO2 98%  LMP 09/16/2015 (Approximate) Nursing note and vital signs reviewed.  Physical Exam  Constitutional: She is oriented to person, place, and time. She appears well-developed and well-nourished. No distress.  Cardiovascular: Normal rate, regular rhythm, normal heart sounds and intact distal pulses.   Pulmonary/Chest: Effort normal and breath sounds normal.  Abdominal: Soft. Bowel sounds are normal. She exhibits no distension and no mass. There is no tenderness. There is no rebound and no guarding.  Neurological: She is alert and oriented to person, place, and time.  Skin: Skin is warm and dry.  Psychiatric: She has a normal mood and affect. Her behavior is normal. Judgment and thought content normal.       Assessment & Plan:   Problem List Items Addressed This Visit      Other   Bipolar I disorder, most recent episode (or current) manic (HCC) (Chronic)    Appears stable with current regimen without adverse side effects and managed by psychiatry. Continue current dosage of depakote and risperidone. Follow up if mood changes. Will continue to monitor.       Abdominal bloating - Primary    Abdominal bloating of undetermined origin which may be related to dyspepsia or possible GERD. Abdominal exam is benign. Treat conservatively with probiotics or Align. Consider starting omeprazole for GERD. Follow up if symptoms worsen or do not improve for additional imaging or treatment.           I have discontinued Ms. Brannen's HYDROcodone-acetaminophen, diphenhydrAMINE, Melatonin, and ibuprofen. I am also having her maintain her gabapentin, divalproex, risperiDONE, and multivitamin with minerals.   Follow-up: Return in about 1 month (around 11/16/2015) for CPE.  Mauricio Po, FNP

## 2015-10-16 NOTE — Progress Notes (Signed)
Pre visit review using our clinic review tool, if applicable. No additional management support is needed unless otherwise documented below in the visit note. 

## 2015-10-16 NOTE — Assessment & Plan Note (Signed)
Abdominal bloating of undetermined origin which may be related to dyspepsia or possible GERD. Abdominal exam is benign. Treat conservatively with probiotics or Align. Consider starting omeprazole for GERD. Follow up if symptoms worsen or do not improve for additional imaging or treatment.

## 2015-10-16 NOTE — Patient Instructions (Signed)
Thank you for choosing [facility].  Summary/Instructions:  Please continue to take your medications as prescribed.   Please schedule a time for a physical at your convenience.   For your bloating please start Activia or Align.  May also use Gas-X or gaviscon.   If your symptoms worsen or fail to improve, please contact our office for further instruction, or in case of emergency go directly to the emergency room at the closest medical facility.

## 2015-10-16 NOTE — Assessment & Plan Note (Signed)
Appears stable with current regimen without adverse side effects and managed by psychiatry. Continue current dosage of depakote and risperidone. Follow up if mood changes. Will continue to monitor.

## 2015-10-17 DIAGNOSIS — F1020 Alcohol dependence, uncomplicated: Secondary | ICD-10-CM | POA: Diagnosis not present

## 2015-10-26 DIAGNOSIS — F1020 Alcohol dependence, uncomplicated: Secondary | ICD-10-CM | POA: Diagnosis not present

## 2015-11-01 ENCOUNTER — Ambulatory Visit (INDEPENDENT_AMBULATORY_CARE_PROVIDER_SITE_OTHER): Payer: Commercial Managed Care - HMO | Admitting: Family

## 2015-11-01 ENCOUNTER — Encounter: Payer: Self-pay | Admitting: Family

## 2015-11-01 ENCOUNTER — Other Ambulatory Visit (INDEPENDENT_AMBULATORY_CARE_PROVIDER_SITE_OTHER): Payer: Commercial Managed Care - HMO

## 2015-11-01 VITALS — BP 122/88 | HR 87 | Temp 97.8°F | Resp 16 | Ht 61.0 in | Wt 148.0 lb

## 2015-11-01 DIAGNOSIS — Z0000 Encounter for general adult medical examination without abnormal findings: Secondary | ICD-10-CM | POA: Insufficient documentation

## 2015-11-01 DIAGNOSIS — Z23 Encounter for immunization: Secondary | ICD-10-CM

## 2015-11-01 DIAGNOSIS — Z5181 Encounter for therapeutic drug level monitoring: Secondary | ICD-10-CM

## 2015-11-01 HISTORY — DX: Encounter for general adult medical examination without abnormal findings: Z00.00

## 2015-11-01 LAB — COMPREHENSIVE METABOLIC PANEL
ALT: 11 U/L (ref 0–35)
AST: 16 U/L (ref 0–37)
Albumin: 4.2 g/dL (ref 3.5–5.2)
Alkaline Phosphatase: 67 U/L (ref 39–117)
BUN: 8 mg/dL (ref 6–23)
CO2: 29 mEq/L (ref 19–32)
Calcium: 9.5 mg/dL (ref 8.4–10.5)
Chloride: 101 mEq/L (ref 96–112)
Creatinine, Ser: 0.86 mg/dL (ref 0.40–1.20)
GFR: 91.97 mL/min (ref >60.00)
Glucose, Bld: 86 mg/dL (ref 70–99)
Potassium: 3.9 mEq/L (ref 3.5–5.1)
Sodium: 136 mEq/L (ref 135–145)
Total Bilirubin: 0.5 mg/dL (ref 0.2–1.2)
Total Protein: 8.4 g/dL — ABNORMAL HIGH (ref 6.0–8.3)

## 2015-11-01 LAB — LIPID PANEL
Cholesterol: 181 mg/dL (ref 0–200)
HDL: 51.6 mg/dL (ref >39.00)
LDL Cholesterol: 111 mg/dL — ABNORMAL HIGH (ref 0–99)
NonHDL: 129.83
Total CHOL/HDL Ratio: 4
Triglycerides: 92 mg/dL (ref 0.0–149.0)
VLDL: 18.4 mg/dL (ref 0.0–40.0)

## 2015-11-01 LAB — CBC
HCT: 42.3 % (ref 36.0–46.0)
Hemoglobin: 13.6 g/dL (ref 12.0–15.0)
MCHC: 32.1 g/dL (ref 30.0–36.0)
MCV: 70.8 fl — ABNORMAL LOW (ref 78.0–100.0)
Platelets: 217 10*3/uL (ref 150.0–400.0)
RBC: 5.97 Mil/uL — ABNORMAL HIGH (ref 3.87–5.11)
RDW: 20.5 % — ABNORMAL HIGH (ref 11.5–15.5)
WBC: 4.4 10*3/uL (ref 4.0–10.5)

## 2015-11-01 NOTE — Progress Notes (Signed)
Pre visit review using our clinic review tool, if applicable. No additional management support is needed unless otherwise documented below in the visit note. 

## 2015-11-01 NOTE — Assessment & Plan Note (Signed)
Reviewed and updated patient's medical, surgical, family and social history. Medications and allergies were also reviewed. Basic screenings for depression, activities of daily living, hearing, cognition and safety were performed. Provider list was updated and health plan was provided to the patient.  

## 2015-11-01 NOTE — Progress Notes (Signed)
Subjective:    Patient ID: Melissa Dougherty, female    DOB: 05/18/1971, 45 y.o.   MRN: [id number]  Chief Complaint  Patient presents with  . CPE    fasting    HPI:  Melissa Dougherty is a 45 y.o. female who presents today for an annual wellness visit.   1) Health Maintenance -   Diet - Averages about 2 meals per day consisting of chicken and occasional fruits and vegetables; Caffeine intake of 1-2 cups per day  Exercise - No structured exercise. Walks on occasion  2) Preventative Exams / Immunizations:  Dental -- Due for exam  Vision -- Up to date   Health Maintenance  Topic Date Due  . TETANUS/TDAP  07/25/2015  . INFLUENZA VACCINE  12/25/2015  . PAP SMEAR  09/26/2018  . HIV Screening  Completed  Tetanus shot;    Immunization History  Administered Date(s) Administered  . Td 07/24/2005  . Tdap 11/01/2015    RISK FACTORS  Tobacco History  Smoking status  . Never Smoker   Smokeless tobacco  . Never Used     Cardiac risk factors: sedentary lifestyle.  Depression Screen  Q1: Over the past two weeks, have you felt down, depressed or hopeless? No  Q2: Over the past two weeks, have you felt little interest or pleasure in doing things? No  Have you lost interest or pleasure in daily life? No  Do you often feel hopeless? No  Do you cry easily over simple problems? No  Activities of Daily Living In your present state of health, do you have any difficulty performing the following activities?:  Driving? No Managing money?  No Feeding yourself? No Getting from bed to chair? No Climbing a flight of stairs? No Preparing food and eating?: No Bathing or showering? No Getting dressed: No Getting to the toilet? No Using the toilet: No Moving around from place to place: No In the past year have you fallen or had a near fall?:No   Home Safety Has smoke detector and wears seat belts. No excess sun exposure. Are there smokers in your home (other than you)?   No Do you feel safe at home?  Yes  Hearing Difficulties: No Do you often ask people to speak up or repeat themselves? No Do you experience ringing or noises in your ears? No  Do you have difficulty understanding soft or whispered voices? No    Cognitive Testing  Alert? Yes   Normal Appearance? Yes  Oriented to person? Yes  Place? Yes   Time? Yes  Recall of three objects?  Yes  Can perform simple calculations? Yes  Displays appropriate judgment? Yes  Can read the correct time from a watch face? Yes  Do you feel that you have a problem with memory? No  Do you often misplace items? No   Advanced Directives have been discussed with the patient? Yes  Current Physicians/Providers and Suppliers  1. Terri Piedra, FNP - Internal Medicine  2. Emeterio Reeve, MD - OB/GYN  Indicate any recent Medical Services you may have received from other than [facility] providers in the past year (date may be approximate).  All answers were reviewed with the patient and necessary referrals were made:  Mauricio Po, Jane Lew   11/01/2015    No Known Allergies   Outpatient Prescriptions Prior to Visit  Medication Sig Dispense Refill  . divalproex (DEPAKOTE ER) 500 MG 24 hr tablet Take 500 mg by mouth at bedtime.  0  .  gabapentin (NEURONTIN) 300 MG capsule Take 300 mg by mouth at bedtime.     . Multiple Vitamin (MULTIVITAMIN WITH MINERALS) TABS tablet Take 1 tablet by mouth daily.    . risperiDONE (RISPERDAL) 1 MG tablet Take 1 mg by mouth at bedtime.  0   No facility-administered medications prior to visit.     Past Medical History  Diagnosis Date  . Bipolar disorder ([place])   . Depression   . Alcohol abuse   . Drug abuse   . GERD (gastroesophageal reflux disease)   . Allergy   . Heart murmur   . Seizures ([place])     Last seizure was in 1989     Past Surgical History  Procedure Laterality Date  . Back surgery    . Tubal ligation       Family History  Problem Relation Age of Onset  .  Diabetes Mother   . Hypertension Mother   . Alcohol abuse Father      Social History   Social History  . Marital Status: Single    Spouse Name: N/A  . Number of Children: 2  . Years of Education: 11   Occupational History  . Disability     Epilepsy, Mental Health Issues   Social History Main Topics  . Smoking status: Never Smoker   . Smokeless tobacco: Never Used  . Alcohol Use: No  . Drug Use: No  . Sexual Activity: Not Currently    Birth Control/ Protection: Surgical   Other Topics Concern  . Not on file   Social History Narrative   Fun: Go shopping   Denies abuse and feels safe at home.      Review of Systems  Constitutional: Denies fever, chills, fatigue, or significant weight gain/loss. HENT: Head: Denies headache or neck pain Ears: Denies changes in hearing, ringing in ears, earache, drainage Nose: Denies discharge, stuffiness, itching, nosebleed, sinus pain Throat: Denies sore throat, hoarseness, dry mouth, sores, thrush Eyes: Denies loss/changes in vision, pain, redness, blurry/double vision, flashing lights Cardiovascular: Denies chest pain/discomfort, tightness, palpitations, shortness of breath with activity, difficulty lying down, swelling, sudden awakening with shortness of breath Respiratory: Denies shortness of breath, cough, sputum production, wheezing Gastrointestinal: Denies dysphasia, heartburn, change in appetite, nausea, change in bowel habits, rectal bleeding, constipation, diarrhea, yellow skin or eyes Genitourinary: Denies frequency, urgency, burning/pain, blood in urine, incontinence, change in urinary strength. Musculoskeletal: Denies muscle/joint pain, stiffness, back pain, redness or swelling of joints, trauma Skin: Denies rashes, lumps, itching, dryness, color changes, or hair/nail changes Neurological: Denies dizziness, fainting, seizures, weakness, numbness, tingling, tremor Psychiatric - Denies nervousness, stress, depression or memory  loss Endocrine: Denies heat or cold intolerance, sweating, frequent urination, excessive thirst, changes in appetite Hematologic: Denies ease of bruising or bleeding    Objective:     BP 122/88 mmHg  Pulse 87  Temp(Src) 97.8 F (36.6 C) (Oral)  Resp 16  Ht 5\' 1"  (1.549 m)  Wt 148 lb (67.132 kg)  BMI 27.98 kg/m2  SpO2 96% Nursing note and vital signs reviewed. Vision: Right eye 20/25 Left eye 20/25 Both eyes 20/20  Physical Exam  Constitutional: She is oriented to person, place, and time. She appears well-developed and well-nourished.  HENT:  Head: Normocephalic.  Right Ear: Hearing, tympanic membrane, external ear and ear canal normal.  Left Ear: Hearing, tympanic membrane, external ear and ear canal normal.  Nose: Nose normal.  Mouth/Throat: Uvula is midline, oropharynx is clear and moist and mucous membranes are  normal.  Eyes: Conjunctivae and EOM are normal. Pupils are equal, round, and reactive to light.  Neck: Neck supple. No JVD present. No tracheal deviation present. No thyromegaly present.  Cardiovascular: Normal rate, regular rhythm, normal heart sounds and intact distal pulses.   Pulmonary/Chest: Effort normal and breath sounds normal.  Abdominal: Soft. Bowel sounds are normal. She exhibits no distension and no mass. There is no tenderness. There is no rebound and no guarding.  Musculoskeletal: Normal range of motion. She exhibits no edema or tenderness.  Lymphadenopathy:    She has no cervical adenopathy.  Neurological: She is alert and oriented to person, place, and time. She has normal reflexes. No cranial nerve deficit. She exhibits normal muscle tone. Coordination normal.  Skin: Skin is warm and dry.  Psychiatric: She has a normal mood and affect. Her behavior is normal. Judgment and thought content normal.       Assessment & Plan:   During the course of the visit the patient was educated and counseled about appropriate screening and preventive services  including:    Pneumococcal vaccine   Influenza vaccine  Td vaccine  Nutrition counseling   Diet review for nutrition referral? Yes ____  Not Indicated _X___   Patient Instructions (the written plan) was given to the patient.  Medicare Attestation I have personally reviewed: The patient's medical and social history Their use of alcohol, tobacco or illicit drugs Their current medications and supplements The patient's functional ability including ADLs,fall risks, home safety risks, cognitive, and hearing and visual impairment Diet and physical activities Evidence for depression or mood disorders  The patient's weight, height, BMI,  have been recorded in the chart.  I have made referrals, counseling, and provided education to the patient based on review of the above and I have provided the patient with a written personalized care plan for preventive services.     Mauricio Po, FNP   11/01/2015    Problem List Items Addressed This Visit      Other   Routine general medical examination at a health care facility    1) Anticipatory Guidance: Discussed importance of wearing a seatbelt while driving and not texting while driving; changing batteries in smoke detector at least once annually; wearing suntan lotion when outside; eating a balanced and moderate diet; getting physical activity at least 30 minutes per day.  2) Immunizations / Screenings / Labs:  Tetanus updated today. All other immunizations are up to date per recommendations. Obtain valproic acid for therapeutic drug monitoring. Due for dental screening encouraged to be completed independently. All other screenings are up to date per recommendations. Obtain CBC, CMET, and Lipid profile.  Overall well exam with risk factors for cardiovascular disease including a fairly sedentary lifestyle, overweight and increased risk from medication regimen. Recommend weight loss of 5-10% of current body weight through lifestyle management  changes. Recommend increasing physical activity to 30 minutes of moderate level activity daily. Encourage nutritional intake that focuses on nutrient dense foods and is moderate, varied, and balanced and is low in saturated fats and processed/sugary foods. Bipolar stable with current regimen and will continue to monitor for side effects of risperidone. Continue other healthy lifestyle behaviors and choices. Follow up prevention exam in 1 year. Follow up office visit for chronic conditions pending blood work.         Relevant Orders   CBC (Completed)   Comprehensive metabolic panel (Completed)   Lipid panel (Completed)   Medicare welcome exam - Primary  Reviewed and updated patient's medical, surgical, family and social history. Medications and allergies were also reviewed. Basic screenings for depression, activities of daily living, hearing, cognition and safety were performed. Provider list was updated and health plan was provided to the patient.        Other Visit Diagnoses    Therapeutic drug monitoring        Relevant Orders    Valproic Acid level    Need for Tdap vaccination        Relevant Orders    Tdap vaccine greater than or equal to 7yo IM (Completed)

## 2015-11-01 NOTE — Patient Instructions (Signed)
Thank you for choosing [facility].  Summary/Instructions:  Please continue to take medications as prescribed.  Please stop by the lab on the basement level of the building for your blood work. Your results will be released to [facility] (or called to you) after review, usually within 72 hours after test completion. If any changes need to be made, you will be notified at that same time.  If your symptoms worsen or fail to improve, please contact our office for further instruction, or in case of emergency go directly to the emergency room at the closest medical facility.   Health Maintenance  Topic Date Due  . TETANUS/TDAP  07/25/2015  . INFLUENZA VACCINE  12/25/2015  . PAP SMEAR  09/26/2018  . HIV Screening  Completed    Health Maintenance, Female Adopting a healthy lifestyle and getting preventive care can go a long way to promote health and wellness. Talk with your health care provider about what schedule of regular examinations is right for you. This is a good chance for you to check in with your provider about disease prevention and staying healthy. In between checkups, there are plenty of things you can do on your own. Experts have done a lot of research about which lifestyle changes and preventive measures are most likely to keep you healthy. Ask your health care provider for more information. WEIGHT AND DIET  Eat a healthy diet  Be sure to include plenty of vegetables, fruits, low-fat dairy products, and lean protein.  Do not eat a lot of foods high in solid fats, added sugars, or salt.  Get regular exercise. This is one of the most important things you can do for your health.  Most adults should exercise for at least 150 minutes each week. The exercise should increase your heart rate and make you sweat (moderate-intensity exercise).  Most adults should also do strengthening exercises at least twice a week. This is in addition to the moderate-intensity exercise.  Maintain  a healthy weight  Body mass index (BMI) is a measurement that can be used to identify possible weight problems. It estimates body fat based on height and weight. Your health care provider can help determine your BMI and help you achieve or maintain a healthy weight.  For females 36 years of age and older:   A BMI below 18.5 is considered underweight.  A BMI of 18.5 to 24.9 is normal.  A BMI of 25 to 29.9 is considered overweight.  A BMI of 30 and above is considered obese.  Watch levels of cholesterol and blood lipids  You should start having your blood tested for lipids and cholesterol at 45 years of age, then have this test every 5 years.  You may need to have your cholesterol levels checked more often if:  Your lipid or cholesterol levels are high.  You are older than 45 years of age.  You are at high risk for heart disease.  CANCER SCREENING   Lung Cancer  Lung cancer screening is recommended for adults 31-38 years old who are at high risk for lung cancer because of a history of smoking.  A yearly low-dose CT scan of the lungs is recommended for people who:  Currently smoke.  Have quit within the past 15 years.  Have at least a 30-pack-year history of smoking. A pack year is smoking an average of one pack of cigarettes a day for 1 year.  Yearly screening should continue until it has been 15 years since you  quit.  Yearly screening should stop if you develop a health problem that would prevent you from having lung cancer treatment.  Breast Cancer  Practice breast self-awareness. This means understanding how your breasts normally appear and feel.  It also means doing regular breast self-exams. Let your health care provider know about any changes, no matter how small.  If you are in your 20s or 30s, you should have a clinical breast exam (CBE) by a health care provider every 1-3 years as part of a regular health exam.  If you are 27 or older, have a CBE every  year. Also consider having a breast X-ray (mammogram) every year.  If you have a family history of breast cancer, talk to your health care provider about genetic screening.  If you are at high risk for breast cancer, talk to your health care provider about having an MRI and a mammogram every year.  Breast cancer gene (BRCA) assessment is recommended for women who have family members with BRCA-related cancers. BRCA-related cancers include:  Breast.  Ovarian.  Tubal.  Peritoneal cancers.  Results of the assessment will determine the need for genetic counseling and BRCA1 and BRCA2 testing. Cervical Cancer Your health care provider may recommend that you be screened regularly for cancer of the pelvic organs (ovaries, uterus, and vagina). This screening involves a pelvic examination, including checking for microscopic changes to the surface of your cervix (Pap test). You may be encouraged to have this screening done every 3 years, beginning at age 28.  For women ages 40-65, health care providers may recommend pelvic exams and Pap testing every 3 years, or they may recommend the Pap and pelvic exam, combined with testing for human papilloma virus (HPV), every 5 years. Some types of HPV increase your risk of cervical cancer. Testing for HPV may also be done on women of any age with unclear Pap test results.  Other health care providers may not recommend any screening for nonpregnant women who are considered low risk for pelvic cancer and who do not have symptoms. Ask your health care provider if a screening pelvic exam is right for you.  If you have had past treatment for cervical cancer or a condition that could lead to cancer, you need Pap tests and screening for cancer for at least 20 years after your treatment. If Pap tests have been discontinued, your risk factors (such as having a new sexual partner) need to be reassessed to determine if screening should resume. Some women have medical  problems that increase the chance of getting cervical cancer. In these cases, your health care provider may recommend more frequent screening and Pap tests. Colorectal Cancer  This type of cancer can be detected and often prevented.  Routine colorectal cancer screening usually begins at 45 years of age and continues through 45 years of age.  Your health care provider may recommend screening at an earlier age if you have risk factors for colon cancer.  Your health care provider may also recommend using home test kits to check for hidden blood in the stool.  A small camera at the end of a tube can be used to examine your colon directly (sigmoidoscopy or colonoscopy). This is done to check for the earliest forms of colorectal cancer.  Routine screening usually begins at age 48.  Direct examination of the colon should be repeated every 5-10 years through 45 years of age. However, you may need to be screened more often if early forms of precancerous  polyps or small growths are found. Skin Cancer  Check your skin from head to toe regularly.  Tell your health care provider about any new moles or changes in moles, especially if there is a change in a mole's shape or color.  Also tell your health care provider if you have a mole that is larger than the size of a pencil eraser.  Always use sunscreen. Apply sunscreen liberally and repeatedly throughout the day.  Protect yourself by wearing long sleeves, pants, a wide-brimmed hat, and sunglasses whenever you are outside. HEART DISEASE, DIABETES, AND HIGH BLOOD PRESSURE   High blood pressure causes heart disease and increases the risk of stroke. High blood pressure is more likely to develop in:  People who have blood pressure in the high end of the normal range (130-139/85-89 mm Hg).  People who are overweight or obese.  People who are African American.  If you are 62-92 years of age, have your blood pressure checked every 3-5 years. If you  are 94 years of age or older, have your blood pressure checked every year. You should have your blood pressure measured twice--once when you are at a hospital or clinic, and once when you are not at a hospital or clinic. Record the average of the two measurements. To check your blood pressure when you are not at a hospital or clinic, you can use:  An automated blood pressure machine at a pharmacy.  A home blood pressure monitor.  If you are between 2 years and 67 years old, ask your health care provider if you should take aspirin to prevent strokes.  Have regular diabetes screenings. This involves taking a blood sample to check your fasting blood sugar level.  If you are at a normal weight and have a low risk for diabetes, have this test once every three years after 45 years of age.  If you are overweight and have a high risk for diabetes, consider being tested at a younger age or more often. PREVENTING INFECTION  Hepatitis B  If you have a higher risk for hepatitis B, you should be screened for this virus. You are considered at high risk for hepatitis B if:  You were born in a country where hepatitis B is common. Ask your health care provider which countries are considered high risk.  Your parents were born in a high-risk country, and you have not been immunized against hepatitis B (hepatitis B vaccine).  You have HIV or AIDS.  You use needles to inject street drugs.  You live with someone who has hepatitis B.  You have had sex with someone who has hepatitis B.  You get hemodialysis treatment.  You take certain medicines for conditions, including cancer, organ transplantation, and autoimmune conditions. Hepatitis C  Blood testing is recommended for:  Everyone born from [birth year] through 1965.  Anyone with known risk factors for hepatitis C. Sexually transmitted infections (STIs)  You should be screened for sexually transmitted infections (STIs) including gonorrhea and chlamydia  if:  You are sexually active and are younger than 45 years of age.  You are older than 45 years of age and your health care provider tells you that you are at risk for this type of infection.  Your sexual activity has changed since you were last screened and you are at an increased risk for chlamydia or gonorrhea. Ask your health care provider if you are at risk.  If you do not have HIV, but are at risk, it  may be recommended that you take a prescription medicine daily to prevent HIV infection. This is called pre-exposure prophylaxis (PrEP). You are considered at risk if:  You are sexually active and do not regularly use condoms or know the HIV status of your partner(s).  You take drugs by injection.  You are sexually active with a partner who has HIV. Talk with your health care provider about whether you are at high risk of being infected with HIV. If you choose to begin PrEP, you should first be tested for HIV. You should then be tested every 3 months for as long as you are taking PrEP.  PREGNANCY   If you are premenopausal and you may become pregnant, ask your health care provider about preconception counseling.  If you may become pregnant, take 400 to 800 micrograms (mcg) of folic acid every day.  If you want to prevent pregnancy, talk to your health care provider about birth control (contraception). OSTEOPOROSIS AND MENOPAUSE   Osteoporosis is a disease in which the bones lose minerals and strength with aging. This can result in serious bone fractures. Your risk for osteoporosis can be identified using a bone density scan.  If you are 66 years of age or older, or if you are at risk for osteoporosis and fractures, ask your health care provider if you should be screened.  Ask your health care provider whether you should take a calcium or vitamin D supplement to lower your risk for osteoporosis.  Menopause may have certain physical symptoms and risks.  Hormone replacement therapy  may reduce some of these symptoms and risks. Talk to your health care provider about whether hormone replacement therapy is right for you.  HOME CARE INSTRUCTIONS   Schedule regular health, dental, and eye exams.  Stay current with your immunizations.   Do not use any tobacco products including cigarettes, chewing tobacco, or electronic cigarettes.  If you are pregnant, do not drink alcohol.  If you are breastfeeding, limit how much and how often you drink alcohol.  Limit alcohol intake to no more than 1 drink per day for nonpregnant women. One drink equals 12 ounces of beer, 5 ounces of wine, or 1 ounces of hard liquor.  Do not use street drugs.  Do not share needles.  Ask your health care provider for help if you need support or information about quitting drugs.  Tell your health care provider if you often feel depressed.  Tell your health care provider if you have ever been abused or do not feel safe at home.   This information is not intended to replace advice given to you by your health care provider. Make sure you discuss any questions you have with your health care provider.   Document Released: 11/25/2010 Document Revised: 06/02/2014 Document Reviewed: 04/13/2013 Elsevier Interactive Patient Education [employer].

## 2015-11-01 NOTE — Assessment & Plan Note (Signed)
1) Anticipatory Guidance: Discussed importance of wearing a seatbelt while driving and not texting while driving; changing batteries in smoke detector at least once annually; wearing suntan lotion when outside; eating a balanced and moderate diet; getting physical activity at least 30 minutes per day.  2) Immunizations / Screenings / Labs:  Tetanus updated today. All other immunizations are up to date per recommendations. Obtain valproic acid for therapeutic drug monitoring. Due for dental screening encouraged to be completed independently. All other screenings are up to date per recommendations. Obtain CBC, CMET, and Lipid profile.  Overall well exam with risk factors for cardiovascular disease including a fairly sedentary lifestyle, overweight and increased risk from medication regimen. Recommend weight loss of 5-10% of current body weight through lifestyle management changes. Recommend increasing physical activity to 30 minutes of moderate level activity daily. Encourage nutritional intake that focuses on nutrient dense foods and is moderate, varied, and balanced and is low in saturated fats and processed/sugary foods. Bipolar stable with current regimen and will continue to monitor for side effects of risperidone. Continue other healthy lifestyle behaviors and choices. Follow up prevention exam in 1 year. Follow up office visit for chronic conditions pending blood work.

## 2015-11-02 DIAGNOSIS — F1020 Alcohol dependence, uncomplicated: Secondary | ICD-10-CM | POA: Diagnosis not present

## 2015-11-02 LAB — VALPROIC ACID LEVEL: Valproic Acid Lvl: 28.2 ug/mL — ABNORMAL LOW (ref 50.0–100.0)

## 2015-11-04 ENCOUNTER — Telehealth: Payer: Self-pay | Admitting: Family

## 2015-11-04 DIAGNOSIS — R718 Other abnormality of red blood cells: Secondary | ICD-10-CM

## 2015-11-04 NOTE — Telephone Encounter (Signed)
Please inform patient that her blood work shows that her cholesterol, kidney function, liver function, and electrolyte function are within the normal limits. Your white/red blood cells are small with concern for iron deficiency. You do not have an anemia. I would like her to follow up for an iron check. She can also start ferrous sulfate 325 mg daily which may help. Follow up will be dependent upon blood work results.

## 2015-11-05 NOTE — Telephone Encounter (Signed)
Pt aware of results 

## 2015-11-09 DIAGNOSIS — F1020 Alcohol dependence, uncomplicated: Secondary | ICD-10-CM | POA: Diagnosis not present

## 2015-11-16 DIAGNOSIS — F1020 Alcohol dependence, uncomplicated: Secondary | ICD-10-CM | POA: Diagnosis not present

## 2015-11-23 DIAGNOSIS — F1020 Alcohol dependence, uncomplicated: Secondary | ICD-10-CM | POA: Diagnosis not present

## 2015-11-26 DIAGNOSIS — F1020 Alcohol dependence, uncomplicated: Secondary | ICD-10-CM | POA: Diagnosis not present

## 2015-11-29 ENCOUNTER — Encounter: Payer: Self-pay | Admitting: Family Medicine

## 2015-11-30 DIAGNOSIS — F1020 Alcohol dependence, uncomplicated: Secondary | ICD-10-CM | POA: Diagnosis not present

## 2015-12-07 DIAGNOSIS — F1020 Alcohol dependence, uncomplicated: Secondary | ICD-10-CM | POA: Diagnosis not present

## 2015-12-21 DIAGNOSIS — F1020 Alcohol dependence, uncomplicated: Secondary | ICD-10-CM | POA: Diagnosis not present

## 2015-12-25 ENCOUNTER — Ambulatory Visit (INDEPENDENT_AMBULATORY_CARE_PROVIDER_SITE_OTHER): Payer: Commercial Managed Care - HMO | Admitting: Family

## 2015-12-25 ENCOUNTER — Encounter: Payer: Self-pay | Admitting: Family

## 2015-12-25 VITALS — BP 118/82 | HR 78 | Temp 97.9°F | Resp 14 | Ht 61.0 in | Wt 142.0 lb

## 2015-12-25 DIAGNOSIS — G44219 Episodic tension-type headache, not intractable: Secondary | ICD-10-CM | POA: Diagnosis not present

## 2015-12-25 DIAGNOSIS — R519 Headache, unspecified: Secondary | ICD-10-CM | POA: Insufficient documentation

## 2015-12-25 DIAGNOSIS — R51 Headache: Secondary | ICD-10-CM

## 2015-12-25 MED ORDER — SUMATRIPTAN SUCCINATE 100 MG PO TABS: ORAL_TABLET | ORAL | 0 refills | Status: DC

## 2015-12-25 NOTE — Progress Notes (Signed)
Subjective:    Patient ID: Melissa Dougherty, female    DOB: Feb 25, 1971, 45 y.o.   MRN: [id number]  Chief Complaint  Patient presents with  . Headache    having alot of major headaches where lights bother her, it has been a constant headache for a couple of weeks    HPI:  Melissa Dougherty is a 45 y.o. female who  has a past medical history of Alcohol abuse; Allergy; Bipolar disorder ([place]); Depression; Drug abuse; GERD (gastroesophageal reflux disease); Heart murmur; and Seizures ([place]). and presents today for an acute office visit.   This is a new problem. Associated symptom of a headache that is described as throbbing with sensitivity to light with no sensitivity to sound, nausea or vomiting. Headaches have been going on for about 2-3 weeks. Denies any head trauma. No history of migraine headaches that she can note. Modifying factors include Tylenol and advil which did help a little with her symptoms. Course of the symptoms has stayed about the same. Recently changed glasses about 6 months ago.    No Known Allergies   Current Outpatient Prescriptions on File Prior to Visit  Medication Sig Dispense Refill  . divalproex (DEPAKOTE ER) 500 MG 24 hr tablet Take 500 mg by mouth at bedtime.  0  . Multiple Vitamin (MULTIVITAMIN WITH MINERALS) TABS tablet Take 1 tablet by mouth daily.    . risperiDONE (RISPERDAL) 1 MG tablet Take 1 mg by mouth at bedtime.  0   No current facility-administered medications on file prior to visit.      Past Surgical History:  Procedure Laterality Date  . BACK SURGERY    . TUBAL LIGATION      Past Medical History:  Diagnosis Date  . Alcohol abuse   . Allergy   . Bipolar disorder ([place])   . Depression   . Drug abuse   . GERD (gastroesophageal reflux disease)   . Heart murmur   . Seizures ([place])    Last seizure was in 1989     Review of Systems  Constitutional: Negative for chills and fever.  HENT: Negative for congestion.   Eyes: Positive for  photophobia.  Respiratory: Negative for chest tightness and shortness of breath.   Gastrointestinal: Negative for nausea and vomiting.  Neurological: Positive for headaches.      Objective:    BP 118/82 (BP Location: Left Arm, Patient Position: Sitting, Cuff Size: Normal)   Pulse 78   Temp 97.9 F (36.6 C) (Oral)   Resp 14   Ht 5\' 1"  (1.549 m)   Wt 142 lb (64.4 kg)   SpO2 99%   BMI 26.83 kg/m  Nursing note and vital signs reviewed.  Physical Exam  Constitutional: She is oriented to person, place, and time. She appears well-developed and well-nourished. No distress.  HENT:  Right Ear: Hearing, tympanic membrane, external ear and ear canal normal.  Left Ear: Hearing, tympanic membrane, external ear and ear canal normal.  Nose: Nose normal.  Mouth/Throat: Uvula is midline, oropharynx is clear and moist and mucous membranes are normal.  Eyes: Conjunctivae and EOM are normal. Pupils are equal, round, and reactive to light.  Cardiovascular: Normal rate, regular rhythm, normal heart sounds and intact distal pulses.   Pulmonary/Chest: Effort normal and breath sounds normal.  Neurological: She is alert and oriented to person, place, and time. No cranial nerve deficit. Coordination normal.  Skin: Skin is warm and dry.  Psychiatric: She has a normal mood and affect.  Her behavior is normal. Judgment and thought content normal.       Assessment & Plan:   Problem List Items Addressed This Visit      Other   Headache - Primary    Symptoms concerning for possible migraine headaches although no previous history of migraine headaches noted. Start Imitrex. Continue over-the-counter medications as needed for symptom relief and supportive care. Reassuring that neurological exam is normal. Continue to monitor and follow-up if symptoms worsen or do not improve with medication regimen.      Relevant Medications   SUMAtriptan (IMITREX) 100 MG tablet    Other Visit Diagnoses   None.       I have discontinued Ms. Odeh's gabapentin. I am also having her start on SUMAtriptan. Additionally, I am having her maintain her divalproex, risperiDONE, and multivitamin with minerals.   Meds ordered this encounter  Medications  . SUMAtriptan (IMITREX) 100 MG tablet    Sig: Take 1 tablet by mouth at the onset of a headache. May repeat in 2 hours if headache persists or recurs.    Dispense:  10 tablet    Refill:  0    Order Specific Question:   Supervising Provider    Answer:   Pricilla Holm A [id number]     Follow-up: Return if symptoms worsen or fail to improve.  Mauricio Po, FNP

## 2015-12-25 NOTE — Assessment & Plan Note (Signed)
Symptoms concerning for possible migraine headaches although no previous history of migraine headaches noted. Start Imitrex. Continue over-the-counter medications as needed for symptom relief and supportive care. Reassuring that neurological exam is normal. Continue to monitor and follow-up if symptoms worsen or do not improve with medication regimen.

## 2015-12-25 NOTE — Patient Instructions (Signed)
Thank you for choosing  HealthCare.  Summary/Instructions:  Your prescription(s) have been submitted to your pharmacy or been printed and provided for you. Please take as directed and contact our office if you believe you are having problem(s) with the medication(s) or have any questions.  If your symptoms worsen or fail to improve, please contact our office for further instruction, or in case of emergency go directly to the emergency room at the closest medical facility.    General Headache Without Cause A headache is pain or discomfort felt around the head or neck area. The specific cause of a headache may not be found. There are many causes and types of headaches. A few common ones are:  Tension headaches.  Migraine headaches.  Cluster headaches.  Chronic daily headaches. HOME CARE INSTRUCTIONS  Watch your condition for any changes. Take these steps to help with your condition: Managing Pain  Take over-the-counter and prescription medicines only as told by your health care provider.  Lie down in a dark, quiet room when you have a headache.  If directed, apply ice to the head and neck area:  Put ice in a plastic bag.  Place a towel between your skin and the bag.  Leave the ice on for 20 minutes, 2-3 times per day.  Use a heating pad or hot shower to apply heat to the head and neck area as told by your health care provider.  Keep lights dim if bright lights bother you or make your headaches worse. Eating and Drinking  Eat meals on a regular schedule.  Limit alcohol use.  Decrease the amount of caffeine you drink, or stop drinking caffeine. General Instructions  Keep all follow-up visits as told by your health care provider. This is important.  Keep a headache journal to help find out what may trigger your headaches. For example, write down:  What you eat and drink.  How much sleep you get.  Any change to your diet or medicines.  Try massage or other  relaxation techniques.  Limit stress.  Sit up straight, and do not tense your muscles.  Do not use tobacco products, including cigarettes, chewing tobacco, or e-cigarettes. If you need help quitting, ask your health care provider.  Exercise regularly as told by your health care provider.  Sleep on a regular schedule. Get 7-9 hours of sleep, or the amount recommended by your health care provider. SEEK MEDICAL CARE IF:   Your symptoms are not helped by medicine.  You have a headache that is different from the usual headache.  You have nausea or you vomit.  You have a fever. SEEK IMMEDIATE MEDICAL CARE IF:   Your headache becomes severe.  You have repeated vomiting.  You have a stiff neck.  You have a loss of vision.  You have problems with speech.  You have pain in the eye or ear.  You have muscular weakness or loss of muscle control.  You lose your balance or have trouble walking.  You feel faint or pass out.  You have confusion.   This information is not intended to replace advice given to you by your health care provider. Make sure you discuss any questions you have with your health care provider.   Document Released: 05/12/2005 Document Revised: 01/31/2015 Document Reviewed: 09/04/2014 Elsevier Interactive Patient Education 2016 [employer]  

## 2015-12-26 ENCOUNTER — Telehealth: Payer: Self-pay

## 2015-12-26 NOTE — Telephone Encounter (Signed)
PA initiated via CoverMyMeds key [id number]

## 2015-12-28 DIAGNOSIS — F1020 Alcohol dependence, uncomplicated: Secondary | ICD-10-CM | POA: Diagnosis not present

## 2015-12-31 NOTE — Telephone Encounter (Signed)
PA for Imitrex DENIED - please advise on alternative medication. Thanks!

## 2016-01-02 MED ORDER — RIZATRIPTAN BENZOATE 10 MG PO TABS: ORAL_TABLET | ORAL | 0 refills | Status: DC

## 2016-01-02 NOTE — Telephone Encounter (Signed)
Pt advised.

## 2016-01-02 NOTE — Telephone Encounter (Signed)
Pt's plan will cover Sumatriptan 25 mg and 50 mg 9 tabs/30 days, Naratriptan 1 mg and 2.5 mg 9 tabs/30 days, and Rizatriptan 5 mg and 10 mg 12 tabs/30 days

## 2016-01-02 NOTE — Telephone Encounter (Signed)
Did they provide any medications that were recommended? If Imitrex is not covered, I am not sure what else would be.

## 2016-01-02 NOTE — Telephone Encounter (Signed)
Rizatriptan sent to pharmacy

## 2016-01-06 ENCOUNTER — Emergency Department (HOSPITAL_COMMUNITY)
Admission: EM | Admit: 2016-01-06 | Discharge: 2016-01-06 | Disposition: A | Discharge status: Home / Self Care | Payer: Commercial Managed Care - HMO | Attending: Emergency Medicine | Admitting: Emergency Medicine

## 2016-01-06 ENCOUNTER — Encounter (HOSPITAL_COMMUNITY): Payer: Self-pay | Admitting: Emergency Medicine

## 2016-01-06 ENCOUNTER — Emergency Department (HOSPITAL_COMMUNITY): Payer: Commercial Managed Care - HMO

## 2016-01-06 DIAGNOSIS — Y92810 Car as the place of occurrence of the external cause: Secondary | ICD-10-CM | POA: Insufficient documentation

## 2016-01-06 DIAGNOSIS — Y939 Activity, unspecified: Secondary | ICD-10-CM | POA: Diagnosis not present

## 2016-01-06 DIAGNOSIS — Y999 Unspecified external cause status: Secondary | ICD-10-CM | POA: Diagnosis not present

## 2016-01-06 DIAGNOSIS — S62660A Nondisplaced fracture of distal phalanx of right index finger, initial encounter for closed fracture: Secondary | ICD-10-CM | POA: Insufficient documentation

## 2016-01-06 DIAGNOSIS — W230XXA Caught, crushed, jammed, or pinched between moving objects, initial encounter: Secondary | ICD-10-CM | POA: Insufficient documentation

## 2016-01-06 DIAGNOSIS — S62609A Fracture of unspecified phalanx of unspecified finger, initial encounter for closed fracture: Secondary | ICD-10-CM

## 2016-01-06 DIAGNOSIS — S6991XA Unspecified injury of right wrist, hand and finger(s), initial encounter: Secondary | ICD-10-CM | POA: Diagnosis present

## 2016-01-06 MED ORDER — TRAMADOL HCL 50 MG PO TABS: 50.0000 mg | ORAL_TABLET | Freq: Four times a day (QID) | ORAL | 0 refills | Status: DC | PRN

## 2016-01-06 NOTE — ED Notes (Signed)
Declined W/C at D/C and was escorted to lobby by RN. 

## 2016-01-06 NOTE — ED Triage Notes (Signed)
Pt sts 2nd finger on right hand was smashed in door yesterday; blood under nail bed noticed

## 2016-01-06 NOTE — ED Provider Notes (Signed)
[facility] DEPT Provider Note   CSN: [id number] Arrival date & time: 01/06/16  1705  First Provider Contact:  First MD Initiated Contact with Patient 01/06/16 1717     By signing my name below, I, Melissa Dougherty, attest that this documentation has been prepared under the direction and in the presence of  Montine Circle, PA-C. Electronically Signed: Royce Dougherty, ED Scribe. 01/06/16. 5:34 PM.    History   Chief Complaint Chief Complaint  Patient presents with  . Finger Injury    The history is provided by the patient. No language interpreter was used.     HPI Comments:  Melissa Dougherty is a 45 y.o. female who presents to the Emergency Department complaining of injury to right index finger after slamming it in a car door yesterday.  She is experiencing 9/10 pain and swelling at the site.  Pain is exacerbated by bending the finger. She has bruising under the nail.  No alleviating factors noted.     Past Medical History:  Diagnosis Date  . Alcohol abuse   . Allergy   . Bipolar disorder ([place])   . Depression   . Drug abuse   . GERD (gastroesophageal reflux disease)   . Heart murmur   . Seizures ([place])    Last seizure was in 1989    Patient Active Problem List   Diagnosis Date Noted  . Headache 12/25/2015  . Routine general medical examination at a health care facility 11/01/2015  . Medicare welcome exam 11/01/2015  . Abdominal bloating 10/16/2015  . Bipolar I disorder, most recent episode (or current) manic ([place]) 07/23/2015  . Bipolar I disorder, most recent episode (or current) mixed, moderate 12/24/2013  . Adjustment disorder with mixed anxiety and depressed mood 12/23/2013  . MDD (major depressive disorder) ([place]) 12/23/2013  . Depressive disorder 12/22/2013  . Papanicolaou smear of cervix with atypical squamous cells of undetermined significance (ASC-US) 10/12/2013  . DEPRESSION 02/17/2007  . GERD 02/17/2007  . SEIZURE DISORDER, HX OF 02/17/2007     Past Surgical History:  Procedure Laterality Date  . BACK SURGERY    . TUBAL LIGATION      OB History    Gravida Para Term Preterm AB Living   2 2 2     2    SAB TAB Ectopic Multiple Live Births           2       Home Medications    Prior to Admission medications   Medication Sig Start Date End Date Taking? Authorizing Provider  divalproex (DEPAKOTE ER) 500 MG 24 hr tablet Take 500 mg by mouth at bedtime. 08/30/15   Historical Provider, MD  Multiple Vitamin (MULTIVITAMIN WITH MINERALS) TABS tablet Take 1 tablet by mouth daily.    Historical Provider, MD  risperiDONE (RISPERDAL) 1 MG tablet Take 1 mg by mouth at bedtime. 08/30/15   Historical Provider, MD  rizatriptan (MAXALT) 10 MG tablet Take 1 tablet by mouth at the onset of headache. May repeat in 2 hours if needed 01/02/16   Golden Circle, FNP    Family History Family History  Problem Relation Age of Onset  . Diabetes Mother   . Hypertension Mother   . Alcohol abuse Father     Social History Social History  Substance Use Topics  . Smoking status: Never Smoker  . Smokeless tobacco: Never Used  . Alcohol use No     Allergies   Review of patient's allergies indicates no known allergies.  Review of Systems Review of Systems  Constitutional: Negative for fever.  Musculoskeletal: Positive for arthralgias and myalgias.       Right index finger  Neurological: Negative for numbness.     Physical Exam Updated Vital Signs BP 105/74 (BP Location: Left Arm)   Pulse 79   Temp 97.5 F (36.4 C) (Oral)   Resp 18   SpO2 100%   Physical Exam   Physical Exam  Constitutional: Pt appears well-developed and well-nourished. No distress.  HENT:  Head: Normocephalic and atraumatic.  Eyes: Conjunctivae are normal.  Neck: Normal range of motion.  Cardiovascular: Normal rate, regular rhythm and intact distal pulses.   Capillary refill < 3 sec  Pulmonary/Chest: Effort normal and breath sounds normal.   Musculoskeletal: Pt exhibits tenderness to palpation of left distal index finger. Pt exhibits no edema.  ROM: 5/5  Neurological: Pt  is alert. Coordination normal.  Sensation 5/5/ Strength 5/5  Skin: Skin is warm and dry. Pt is not diaphoretic.  No tenting of the skin  Clotted subungual hematoma Psychiatric: Pt has a normal mood and affect.  Nursing note and vitals reviewed.    ED Treatments / Results   DIAGNOSTIC STUDIES:  Oxygen Saturation is 100% on RA, nml by my interpretation.    COORDINATION OF CARE:  5:34 PM Discussed treatment plan with pt at bedside and pt agreed to plan.   Labs (all labs ordered are listed, but only abnormal results are displayed) Labs Reviewed - No data to display  EKG  EKG Interpretation None       Radiology No results found.  Procedures Procedures (including critical care time)  Medications Ordered in ED Medications - No data to display   Initial Impression / Assessment and Plan / ED Course  I have reviewed the triage vital signs and the nursing notes.  Pertinent labs & imaging results that were available during my care of the patient were reviewed by me and considered in my medical decision making (see chart for details).  Clinical Course    Distal tuft fracture.  Splint.  RICE.  PCP follow-up.  Final Clinical Impressions(s) / ED Diagnoses   Final diagnoses:  Finger fracture, closed, initial encounter    New Prescriptions New Prescriptions   TRAMADOL (ULTRAM) 50 MG TABLET    Take 1 tablet (50 mg total) by mouth every 6 (six) hours as needed.   I personally performed the services described in this documentation, which was scribed in my presence. The recorded information has been reviewed and is accurate.       Montine Circle, PA-C 01/06/16 [place], MD 01/07/16 [id number]

## 2016-01-14 ENCOUNTER — Telehealth: Payer: Self-pay | Admitting: Family

## 2016-01-14 DIAGNOSIS — D259 Leiomyoma of uterus, unspecified: Secondary | ICD-10-CM

## 2016-01-14 NOTE — Telephone Encounter (Signed)
Referral placed.

## 2016-01-14 NOTE — Telephone Encounter (Signed)
Patient is requesting referral to gynecologist for fibroid issues.  Patient states she is having longer menstrual cycles and is in pain. Patient is requesting urgent referral.

## 2016-01-25 ENCOUNTER — Encounter: Payer: Self-pay | Admitting: Family

## 2016-01-25 ENCOUNTER — Ambulatory Visit (INDEPENDENT_AMBULATORY_CARE_PROVIDER_SITE_OTHER): Payer: Commercial Managed Care - HMO | Admitting: Family

## 2016-01-25 DIAGNOSIS — J069 Acute upper respiratory infection, unspecified: Secondary | ICD-10-CM | POA: Diagnosis not present

## 2016-01-25 DIAGNOSIS — F3113 Bipolar disorder, current episode manic without psychotic features, severe: Secondary | ICD-10-CM | POA: Diagnosis not present

## 2016-01-25 NOTE — Assessment & Plan Note (Signed)
Symptoms and exam consistent with acute upper respiratory infection most likely viral. Treat conservatively with over-the-counter medications as needed for symptom relief and supportive care. Follow-up if symptoms worsen or do not improve. 

## 2016-01-25 NOTE — Progress Notes (Signed)
Subjective:    Patient ID: Melissa Dougherty, female    DOB: [birth year], 45 y.o.   MRN: [id number]  Chief Complaint  Patient presents with  . Sore Throat    starting yesterday, sore throat, loss of voice, sinus congestion    HPI:  Melissa Dougherty is a 45 y.o. female who  has a past medical history of Alcohol abuse; Allergy; Bipolar disorder ([place]); Depression; Drug abuse; GERD (gastroesophageal reflux disease); Heart murmur; and Seizures ([place]). and presents today for an acute office visit.  This is a new problem. Associated symptom of sore throat, loss of voice and sinus congestion has been going on for approximately 24 hours. Worried she is pushing herself too hard or too fast with all that she has going on. Denies fevers. Modifying factors include hot tea and Hall's which did help some. Describes some difficulty sleeping. Denies any recent antibiotics. No sick contacts.    No Known Allergies   Outpatient Medications Prior to Visit  Medication Sig Dispense Refill  . Multiple Vitamin (MULTIVITAMIN WITH MINERALS) TABS tablet Take 1 tablet by mouth daily.    . rizatriptan (MAXALT) 10 MG tablet Take 1 tablet by mouth at the onset of headache. May repeat in 2 hours if needed 12 tablet 0  . traMADol (ULTRAM) 50 MG tablet Take 1 tablet (50 mg total) by mouth every 6 (six) hours as needed. 7 tablet 0  . divalproex (DEPAKOTE ER) 500 MG 24 hr tablet Take 500 mg by mouth at bedtime.  0  . risperiDONE (RISPERDAL) 1 MG tablet Take 1 mg by mouth at bedtime.  0   No facility-administered medications prior to visit.      Review of Systems  Constitutional: Negative for chills and fever.  HENT: Positive for congestion, ear pain, sinus pressure, sore throat and voice change.   Respiratory: Negative for cough, chest tightness and shortness of breath.   Neurological: Negative for headaches.      Objective:    BP 122/86 (BP Location: Left Arm, Patient Position: Sitting, Cuff Size: Normal)    Pulse 88   Temp 98.1 F (36.7 C) (Oral)   Resp 16   Ht 5\' 1"  (1.549 m)   Wt 141 lb (64 kg)   LMP 12/17/2015   SpO2 99%   BMI 26.64 kg/m  Nursing note and vital signs reviewed.  Physical Exam  Constitutional: She is oriented to person, place, and time. She appears well-developed and well-nourished. No distress.  HENT:  Right Ear: Hearing, tympanic membrane, external ear and ear canal normal.  Left Ear: Hearing, tympanic membrane, external ear and ear canal normal.  Nose: Right sinus exhibits maxillary sinus tenderness. Right sinus exhibits no frontal sinus tenderness. Left sinus exhibits maxillary sinus tenderness. Left sinus exhibits no frontal sinus tenderness.  Mouth/Throat: Uvula is midline, oropharynx is clear and moist and mucous membranes are normal.  Neck: Neck supple.  Cardiovascular: Normal rate, regular rhythm, normal heart sounds and intact distal pulses.   Pulmonary/Chest: Effort normal and breath sounds normal.  Neurological: She is alert and oriented to person, place, and time.  Skin: Skin is warm and dry.  Psychiatric: She has a normal mood and affect. Her behavior is normal. Judgment and thought content normal.       Assessment & Plan:   Problem List Items Addressed This Visit      Respiratory   Acute upper respiratory infection    Symptoms and exam consistent with acute upper respiratory infection most  likely viral. Treat conservatively with over-the-counter medications as needed for symptom relief and supportive care. Follow-up if symptoms worsen or do not improve.       Other Visit Diagnoses   None.      I am having Ms. Behnke maintain her divalproex, risperiDONE, multivitamin with minerals, rizatriptan, and traMADol.   Follow-up: Return if symptoms worsen or fail to improve.  Mauricio Po, FNP

## 2016-01-25 NOTE — Patient Instructions (Addendum)
Thank you for choosing [facility].  SUMMARY AND INSTRUCTIONS:  For sleep try Nyquil, Unisom or Benedryl.   Medication:  Your prescription(s) have been submitted to your pharmacy or been printed and provided for you. Please take as directed and contact our office if you believe you are having problem(s) with the medication(s) or have any questions.  Follow up:  If your symptoms worsen or fail to improve, please contact our office for further instruction, or in case of emergency go directly to the emergency room at the closest medical facility.    General Recommendations:    Please drink plenty of fluids.  Get plenty of rest   Sleep in humidified air  Use saline nasal sprays  Netti pot   OTC Medications:  Decongestants - helps relieve congestion   Flonase (generic fluticasone) or Nasacort (generic triamcinolone) - please make sure to use the "cross-over" technique at a 45 degree angle towards the opposite eye as opposed to straight up the nasal passageway.   Sudafed (generic pseudoephedrine - Note this is the one that is available behind the pharmacy counter); Products with phenylephrine (-PE) may also be used but is often not as effective as pseudoephedrine.   If you have HIGH BLOOD PRESSURE - Coricidin HBP; AVOID any product that is -D as this contains pseudoephedrine which may increase your blood pressure.  Afrin (oxymetazoline) every 6-8 hours for up to 3 days.   Allergies - helps relieve runny nose, itchy eyes and sneezing   Claritin (generic loratidine), Allegra (fexofenidine), or Zyrtec (generic cyrterizine) for runny nose. These medications should not cause drowsiness.  Note - Benadryl (generic diphenhydramine) may be used however may cause drowsiness  Cough -   Delsym or Robitussin (generic dextromethorphan)  Expectorants - helps loosen mucus to ease removal   Mucinex (generic guaifenesin) as directed on the package.  Headaches / General  Aches   Tylenol (generic acetaminophen) - DO NOT EXCEED 3 grams (3,000 mg) in a 24 hour time period  Advil/Motrin (generic ibuprofen)   Sore Throat -   Salt water gargle   Chloraseptic (generic benzocaine) spray or lozenges / Sucrets (generic dyclonine)       Upper Respiratory Infection, Adult Most upper respiratory infections (URIs) are a viral infection of the air passages leading to the lungs. A URI affects the nose, throat, and upper air passages. The most common type of URI is nasopharyngitis and is typically referred to as "the common cold." URIs run their course and usually go away on their own. Most of the time, a URI does not require medical attention, but sometimes a bacterial infection in the upper airways can follow a viral infection. This is called a secondary infection. Sinus and middle ear infections are common types of secondary upper respiratory infections. Bacterial pneumonia can also complicate a URI. A URI can worsen asthma and chronic obstructive pulmonary disease (COPD). Sometimes, these complications can require emergency medical care and may be life threatening.  CAUSES Almost all URIs are caused by viruses. A virus is a type of germ and can spread from one person to another.  RISKS FACTORS You may be at risk for a URI if:   You smoke.   You have chronic heart or lung disease.  You have a weakened defense (immune) system.   You are very young or very old.   You have nasal allergies or asthma.  You work in crowded or poorly ventilated areas.  You work in health care facilities or schools.  SIGNS AND SYMPTOMS  Symptoms typically develop 2-3 days after you come in contact with a cold virus. Most viral URIs last 7-10 days. However, viral URIs from the influenza virus (flu virus) can last 14-18 days and are typically more severe. Symptoms may include:   Runny or stuffy (congested) nose.   Sneezing.   Cough.   Sore throat.   Headache.    Fatigue.   Fever.   Loss of appetite.   Pain in your forehead, behind your eyes, and over your cheekbones (sinus pain).  Muscle aches.  DIAGNOSIS  Your health care provider may diagnose a URI by:  Physical exam.  Tests to check that your symptoms are not due to another condition such as:  Strep throat.  Sinusitis.  Pneumonia.  Asthma. TREATMENT  A URI goes away on its own with time. It cannot be cured with medicines, but medicines may be prescribed or recommended to relieve symptoms. Medicines may help:  Reduce your fever.  Reduce your cough.  Relieve nasal congestion. HOME CARE INSTRUCTIONS   Take medicines only as directed by your health care provider.   Gargle warm saltwater or take cough drops to comfort your throat as directed by your health care provider.  Use a warm mist humidifier or inhale steam from a shower to increase air moisture. This may make it easier to breathe.  Drink enough fluid to keep your urine clear or pale yellow.   Eat soups and other clear broths and maintain good nutrition.   Rest as needed.   Return to work when your temperature has returned to normal or as your health care provider advises. You may need to stay home longer to avoid infecting others. You can also use a face mask and careful hand washing to prevent spread of the virus.  Increase the usage of your inhaler if you have asthma.   Do not use any tobacco products, including cigarettes, chewing tobacco, or electronic cigarettes. If you need help quitting, ask your health care provider. PREVENTION  The best way to protect yourself from getting a cold is to practice good hygiene.   Avoid oral or hand contact with people with cold symptoms.   Wash your hands often if contact occurs.  There is no clear evidence that vitamin C, vitamin E, echinacea, or exercise reduces the chance of developing a cold. However, it is always recommended to get plenty of rest,  exercise, and practice good nutrition.  SEEK MEDICAL CARE IF:   You are getting worse rather than better.   Your symptoms are not controlled by medicine.   You have chills.  You have worsening shortness of breath.  You have brown or red mucus.  You have yellow or brown nasal discharge.  You have pain in your face, especially when you bend forward.  You have a fever.  You have swollen neck glands.  You have pain while swallowing.  You have white areas in the back of your throat. SEEK IMMEDIATE MEDICAL CARE IF:   You have severe or persistent:  Headache.  Ear pain.  Sinus pain.  Chest pain.  You have chronic lung disease and any of the following:  Wheezing.  Prolonged cough.  Coughing up blood.  A change in your usual mucus.  You have a stiff neck.  You have changes in your:  Vision.  Hearing.  Thinking.  Mood. MAKE SURE YOU:   Understand these instructions.  Will watch your condition.  Will get help right away  if you are not doing well or get worse.   This information is not intended to replace advice given to you by your health care provider. Make sure you discuss any questions you have with your health care provider.   Document Released: 11/05/2000 Document Revised: 09/26/2014 Document Reviewed: 08/17/2013 Elsevier Interactive Patient Education [employer].

## 2016-01-30 DIAGNOSIS — F3113 Bipolar disorder, current episode manic without psychotic features, severe: Secondary | ICD-10-CM | POA: Diagnosis not present

## 2016-02-02 ENCOUNTER — Emergency Department (HOSPITAL_COMMUNITY)
Admission: EM | Admit: 2016-02-02 | Discharge: 2016-02-02 | Disposition: A | Discharge status: Home / Self Care | Payer: Commercial Managed Care - HMO | Attending: Emergency Medicine | Admitting: Emergency Medicine

## 2016-02-02 ENCOUNTER — Encounter (HOSPITAL_COMMUNITY): Payer: Self-pay | Admitting: *Deleted

## 2016-02-02 DIAGNOSIS — H6503 Acute serous otitis media, bilateral: Secondary | ICD-10-CM | POA: Diagnosis not present

## 2016-02-02 DIAGNOSIS — H9203 Otalgia, bilateral: Secondary | ICD-10-CM | POA: Diagnosis present

## 2016-02-02 MED ORDER — AMOXICILLIN 500 MG PO CAPS: 500.0000 mg | ORAL_CAPSULE | Freq: Three times a day (TID) | ORAL | 0 refills | Status: DC

## 2016-02-02 MED ORDER — OXYMETAZOLINE HCL 0.05 % NA SOLN: 1.0000 | Freq: Two times a day (BID) | NASAL | 0 refills | Status: DC

## 2016-02-02 NOTE — ED Provider Notes (Signed)
[facility] DEPT Provider Note   CSN: [id number] Arrival date & time: 02/02/16  0256     History   Chief Complaint Chief Complaint  Patient presents with  . Otalgia    HPI Melissa Dougherty is a 45 y.o. female.  HPI   Patient to the ER with complaints of bilateral ear pain left worse than right. She has a past medical history of alcohol abuse, drug abuse, bipolar disorder, depression and seizures. She said her pain started 1 week ago. The pain is intermittent, worse during the mornings. She denies having any fevers, sore throat, neck pain, headaches, N/V/D, abdominal pain, confusion, SOB, CP.  Past Medical History:  Diagnosis Date  . Alcohol abuse   . Allergy   . Bipolar disorder ([place])   . Depression   . Drug abuse   . GERD (gastroesophageal reflux disease)   . Heart murmur   . Seizures ([place])    Last seizure was in 1989    Patient Active Problem List   Diagnosis Date Noted  . Acute upper respiratory infection 01/25/2016  . Headache 12/25/2015  . Routine general medical examination at a health care facility 11/01/2015  . Medicare welcome exam 11/01/2015  . Abdominal bloating 10/16/2015  . Bipolar I disorder, most recent episode (or current) manic ([place]) 07/23/2015  . Bipolar I disorder, most recent episode (or current) mixed, moderate 12/24/2013  . Adjustment disorder with mixed anxiety and depressed mood 12/23/2013  . MDD (major depressive disorder) ([place]) 12/23/2013  . Depressive disorder 12/22/2013  . Papanicolaou smear of cervix with atypical squamous cells of undetermined significance (ASC-US) 10/12/2013  . DEPRESSION 02/17/2007  . GERD 02/17/2007  . SEIZURE DISORDER, HX OF 02/17/2007    Past Surgical History:  Procedure Laterality Date  . BACK SURGERY    . TUBAL LIGATION      OB History    Gravida Para Term Preterm AB Living   2 2 2     2    SAB TAB Ectopic Multiple Live Births           2       Home Medications    Prior to Admission  medications   Medication Sig Start Date End Date Taking? Authorizing Provider  amoxicillin (AMOXIL) 500 MG capsule Take 1 capsule (500 mg total) by mouth 3 (three) times daily. 02/02/16   Audray Rumore Carlota Raspberry, PA-C  divalproex (DEPAKOTE ER) 500 MG 24 hr tablet Take 500 mg by mouth at bedtime. 08/30/15   Historical Provider, MD  Multiple Vitamin (MULTIVITAMIN WITH MINERALS) TABS tablet Take 1 tablet by mouth daily.    Historical Provider, MD  oxymetazoline (AFRIN NASAL SPRAY) 0.05 % nasal spray Place 1 spray into both nostrils 2 (two) times daily. 02/02/16   Angelino Rumery Carlota Raspberry, PA-C  risperiDONE (RISPERDAL) 1 MG tablet Take 1 mg by mouth at bedtime. 08/30/15   Historical Provider, MD  rizatriptan (MAXALT) 10 MG tablet Take 1 tablet by mouth at the onset of headache. May repeat in 2 hours if needed 01/02/16   Golden Circle, FNP  traMADol (ULTRAM) 50 MG tablet Take 1 tablet (50 mg total) by mouth every 6 (six) hours as needed. 01/06/16   Montine Circle, PA-C    Family History Family History  Problem Relation Age of Onset  . Diabetes Mother   . Hypertension Mother   . Alcohol abuse Father     Social History Social History  Substance Use Topics  . Smoking status: Never Smoker  . Smokeless tobacco:  Never Used  . Alcohol use No     Allergies   Review of patient's allergies indicates no known allergies.   Review of Systems Review of Systems  Review of Systems All other systems negative except as documented in the HPI. All pertinent positives and negatives as reviewed in the HPI.  Physical Exam Updated Vital Signs BP 118/87 (BP Location: Right Arm)   Pulse 81   Temp 97.3 F (36.3 C) (Oral)   Resp 14   Ht 5\' 1"  (1.549 m)   Wt 59.5 kg   LMP 01/17/2016   SpO2 100%   BMI 24.78 kg/m   Physical Exam  Constitutional: She appears well-developed and well-nourished. No distress.  HENT:  Head: Normocephalic and atraumatic.  Right Ear: There is tenderness. Tympanic membrane is erythematous. A middle  ear effusion is present.  Left Ear: There is tenderness. Tympanic membrane is erythematous. A middle ear effusion is present.  Eyes: Pupils are equal, round, and reactive to light.  Neck: Normal range of motion. Neck supple.  Cardiovascular: Normal rate and regular rhythm.   Pulmonary/Chest: Effort normal.  Abdominal: Soft.  Neurological: She is alert.  Skin: Skin is warm and dry.  Nursing note and vitals reviewed.    ED Treatments / Results  Labs (all labs ordered are listed, but only abnormal results are displayed) Labs Reviewed - No data to display  EKG  EKG Interpretation None       Radiology No results found.  Procedures Procedures (including critical care time)  Medications Ordered in ED Medications - No data to display   Initial Impression / Assessment and Plan / ED Course  I have reviewed the triage vital signs and the nursing notes.  Pertinent labs & imaging results that were available during my care of the patient were reviewed by me and considered in my medical decision making (see chart for details).  Clinical Course    Rx: Afrin and Amoxicillin  Patient presents with otalgia and exam consistent with acute otitis media. No concern for acute mastoiditis, meningitis.  .  Advised patient to follow-up with PCP.  I have also discussed reasons to return immediately to the ER.  Patient expresses understanding and agrees with plan. Pt appears safe for discharge.   Final Clinical Impressions(s) / ED Diagnoses   Final diagnoses:  Bilateral acute serous otitis media, recurrence not specified    New Prescriptions New Prescriptions   AMOXICILLIN (AMOXIL) 500 MG CAPSULE    Take 1 capsule (500 mg total) by mouth 3 (three) times daily.   OXYMETAZOLINE (AFRIN NASAL SPRAY) 0.05 % NASAL SPRAY    Place 1 spray into both nostrils 2 (two) times daily.     Delos Haring, PA-C 02/02/16 [place], DO 02/02/16 [id number]

## 2016-02-02 NOTE — ED Triage Notes (Signed)
The pt has had a sinus infection and she  Cannot hear from her lt ear    lmp  Aug 24th

## 2016-02-05 DIAGNOSIS — F3113 Bipolar disorder, current episode manic without psychotic features, severe: Secondary | ICD-10-CM | POA: Diagnosis not present

## 2016-02-07 ENCOUNTER — Other Ambulatory Visit (INDEPENDENT_AMBULATORY_CARE_PROVIDER_SITE_OTHER): Payer: Medicaid Other

## 2016-02-07 ENCOUNTER — Ambulatory Visit (INDEPENDENT_AMBULATORY_CARE_PROVIDER_SITE_OTHER): Payer: Commercial Managed Care - HMO | Admitting: Family

## 2016-02-07 ENCOUNTER — Encounter: Payer: Self-pay | Admitting: Family

## 2016-02-07 ENCOUNTER — Other Ambulatory Visit: Payer: Self-pay | Admitting: Family

## 2016-02-07 VITALS — BP 138/82 | HR 112 | Temp 97.7°F | Resp 16 | Ht 61.0 in | Wt 139.0 lb

## 2016-02-07 DIAGNOSIS — Z23 Encounter for immunization: Secondary | ICD-10-CM

## 2016-02-07 DIAGNOSIS — K5900 Constipation, unspecified: Secondary | ICD-10-CM | POA: Diagnosis not present

## 2016-02-07 DIAGNOSIS — R5383 Other fatigue: Secondary | ICD-10-CM

## 2016-02-07 DIAGNOSIS — Z1231 Encounter for screening mammogram for malignant neoplasm of breast: Secondary | ICD-10-CM

## 2016-02-07 LAB — TSH: TSH: 1.15 u[IU]/mL (ref 0.35–4.50)

## 2016-02-07 LAB — CBC
HCT: 37.3 % (ref 36.0–46.0)
Hemoglobin: 12.1 g/dL (ref 12.0–15.0)
MCHC: 32.5 g/dL (ref 30.0–36.0)
MCV: 70.1 fl — ABNORMAL LOW (ref 78.0–100.0)
Platelets: 169 10*3/uL (ref 150.0–400.0)
RBC: 5.32 Mil/uL — ABNORMAL HIGH (ref 3.87–5.11)
RDW: 19.5 % — ABNORMAL HIGH (ref 11.5–15.5)
WBC: 4.6 10*3/uL (ref 4.0–10.5)

## 2016-02-07 LAB — B12 AND FOLATE PANEL
Folate: >23.7 ng/mL (ref >5.9)
Vitamin B-12: 898 pg/mL (ref 211–911)

## 2016-02-07 LAB — IBC PANEL
Iron: 233 ug/dL — ABNORMAL HIGH (ref 42–145)
Saturation Ratios: 67.7 % — ABNORMAL HIGH (ref 20.0–50.0)
Transferrin: 246 mg/dL (ref 212.0–360.0)

## 2016-02-07 LAB — HEMOGLOBIN A1C: Hgb A1c MFr Bld: 5.6 % (ref 4.6–6.5)

## 2016-02-07 NOTE — Progress Notes (Signed)
Subjective:    Patient ID: Melissa Dougherty, female    DOB: 1970/12/10, 45 y.o.   MRN: [id number]  Chief Complaint  Patient presents with  . Fatigue    feeling "sluggish" states all she wants to do is sleep, lack of energy, constipation, has not had a BM since last week    HPI:  Melissa Dougherty is a 45 y.o. female who  has a past medical history of Alcohol abuse; Allergy; Bipolar disorder ([place]); Depression; Drug abuse; GERD (gastroesophageal reflux disease); Heart murmur; and Seizures ([place]). and presents today For an office visit.  1.) Decreased energy/fatigue - this is a new problem. Associated symptom of fatigue, wanting to sleep all the time, lack of energy, and feeling sluggish hemming going on for approximately 3-4 weeks. Symptoms have generally wax and wane. Denies any changes in medication. Sleeping approximately 8 hours per night and feels well rested in the morning. She is followed by [facility] for her Bipolar and depression.   2.) Constipation - this is a new problem. Associated symptom of constipation has been going on for approximately one to 2 weeks with her last bowel movement approximately one week ago.Notes that she does have flatulance.  Indicates that she does eat fiber but probably not enough. Modifying factors include Citrix drink and Miralax which has not helped. Has only taken one dose of the medication to this point. Notes this is a chronic problem. Generally has about 1-2 bowel movements per week depending upon what she is eating. No abdominal pain but does have bloating. No nausea or vomiting.   No Known Allergies    Outpatient Medications Prior to Visit  Medication Sig Dispense Refill  . amoxicillin (AMOXIL) 500 MG capsule Take 1 capsule (500 mg total) by mouth 3 (three) times daily. 21 capsule 0  . divalproex (DEPAKOTE ER) 500 MG 24 hr tablet Take 500 mg by mouth at bedtime.  0  . Multiple Vitamin (MULTIVITAMIN WITH MINERALS) TABS tablet Take 1 tablet by mouth  daily.    Marland Kitchen oxymetazoline (AFRIN NASAL SPRAY) 0.05 % nasal spray Place 1 spray into both nostrils 2 (two) times daily. 30 mL 0  . risperiDONE (RISPERDAL) 1 MG tablet Take 1 mg by mouth at bedtime.  0  . rizatriptan (MAXALT) 10 MG tablet Take 1 tablet by mouth at the onset of headache. May repeat in 2 hours if needed 12 tablet 0  . traMADol (ULTRAM) 50 MG tablet Take 1 tablet (50 mg total) by mouth every 6 (six) hours as needed. 7 tablet 0   No facility-administered medications prior to visit.       Past Surgical History:  Procedure Laterality Date  . BACK SURGERY    . TUBAL LIGATION        Past Medical History:  Diagnosis Date  . Alcohol abuse   . Allergy   . Bipolar disorder ([place])   . Depression   . Drug abuse   . GERD (gastroesophageal reflux disease)   . Heart murmur   . Seizures ([place])    Last seizure was in 1989    Review of Systems  Constitutional: Positive for fatigue. Negative for chills and fever.  Respiratory: Negative for chest tightness and shortness of breath.   Cardiovascular: Negative for chest pain, palpitations and leg swelling.  Neurological: Negative for headaches.  Psychiatric/Behavioral: Negative for dysphoric mood and suicidal ideas. The patient is not nervous/anxious.       Objective:    BP 138/82 (BP  Location: Left Arm, Patient Position: Sitting, Cuff Size: Normal)   Pulse (!) 112   Temp 97.7 F (36.5 C) (Oral)   Resp 16   Ht 5\' 1"  (1.549 m)   Wt 139 lb (63 kg)   LMP 01/17/2016   SpO2 99%   BMI 26.26 kg/m  Nursing note and vital signs reviewed.  Physical Exam  Constitutional: She is oriented to person, place, and time. She appears well-developed and well-nourished. No distress.  Cardiovascular: Normal rate, regular rhythm, normal heart sounds and intact distal pulses.   Pulmonary/Chest: Effort normal and breath sounds normal.  Abdominal: Normal appearance and bowel sounds are normal. She exhibits distension. She exhibits no mass. There  is no hepatosplenomegaly. There is no tenderness. There is no rigidity, no rebound, no guarding, no tenderness at McBurney's point and negative Murphy's sign.  Neurological: She is alert and oriented to person, place, and time.  Skin: Skin is warm and dry.  Psychiatric: She has a normal mood and affect. Her behavior is normal. Judgment and thought content normal.       Assessment & Plan:   Problem List Items Addressed This Visit      Digestive   Constipation    Concern for possible chronic constipation. Recommend continuing over-the-counter medications including Dulcolax, magnesium citrate, and MiraLAX. Start docusate sodium. Abdominal exam with distention with no abdominal pain and good bowel sounds with little concern for obstruction. If symptoms worsen or do not improve with over-the-counter medications and increased fiber/fluid intake, consider additional prescription medications as needed.        Other   Fatigue - Primary    Symptoms of fatigue of undetermined origin with differentials including metabolic, psychologic, and cardiovascular. Obtain TSH, IBC panel, A1c, CBC, and B 12/folate to rule out metabolic causes. Cannot rule out underlying depression/anxiety and exacerbation resulting in her symptoms. Unlikely cardiovascular disease given age and low risk factors. Encouraged to continue sleeping with good sleep hygiene and may trial caffeine as needed to provide energy pending blood work.      Relevant Orders   Hemoglobin A1c (Completed)   B12 and Folate Panel (Completed)   CBC (Completed)   IBC panel (Completed)   TSH (Completed)    Other Visit Diagnoses    Encounter for immunization           I am having Ms. Burrows maintain her divalproex, risperiDONE, multivitamin with minerals, rizatriptan, traMADol, amoxicillin, and oxymetazoline.   Follow-up: Return in about 1 month (around 03/08/2016), or if symptoms worsen or fail to improve.  Mauricio Po, FNP

## 2016-02-07 NOTE — Assessment & Plan Note (Signed)
Concern for possible chronic constipation. Recommend continuing over-the-counter medications including Dulcolax, magnesium citrate, and MiraLAX. Start docusate sodium. Abdominal exam with distention with no abdominal pain and good bowel sounds with little concern for obstruction. If symptoms worsen or do not improve with over-the-counter medications and increased fiber/fluid intake, consider additional prescription medications as needed.

## 2016-02-07 NOTE — Assessment & Plan Note (Signed)
Symptoms of fatigue of undetermined origin with differentials including metabolic, psychologic, and cardiovascular. Obtain TSH, IBC panel, A1c, CBC, and B 12/folate to rule out metabolic causes. Cannot rule out underlying depression/anxiety and exacerbation resulting in her symptoms. Unlikely cardiovascular disease given age and low risk factors. Encouraged to continue sleeping with good sleep hygiene and may trial caffeine as needed to provide energy pending blood work.

## 2016-02-07 NOTE — Patient Instructions (Signed)
Thank you for choosing [facility].  SUMMARY AND INSTRUCTIONS:  For your constipation - Consider using Duculox suppoisitories/oral, magnesium citrate and also start taking docusate sodium 100-200 mg daily as a stool softener.   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 If your symptoms do not improve, please let us know.   Medication:  Continue to take your medication as prescribed.   Labs:  Please stop by the lab on the lower level of the building for your blood work. Your results will be released to [place] (or called to you) after review, usually within 72 hours after test completion. If any changes need to be made, you will be notified at that same time.  1.) The lab is open from 7:30am to 5:30 pm Monday-Friday 2.) No appointment is necessary 3.) Fasting (if needed) is 6-8 hours after food and drink; black coffee and water are okay   Follow up:  If your symptoms worsen or fail to improve, please contact our office for further instruction, or in case of emergency go directly to the emergency room at the closest medical facility.    Fatigue Fatigue is feeling tired all of the time, a lack of energy, or a lack of motivation. Occasional or mild fatigue is often a normal response to activity or life in general. However, long-lasting (chronic) or extreme fatigue may indicate an underlying medical condition. HOME CARE INSTRUCTIONS  Watch your fatigue for any changes. The following actions may help to lessen any discomfort you are feeling:  Talk to your health care provider about how much sleep you need each night. Try to get the required amount every night.  Take medicines only as directed by your health care provider.  Eat a healthy and nutritious diet. Ask your health care provider if you need help changing your diet.  Drink enough fluid to keep your urine clear or pale yellow.  Practice ways of relaxing, such as yoga, meditation, massage therapy, or  acupuncture.  Exercise regularly.   Change situations that cause you stress. Try to keep your work and personal routine reasonable.  Do not abuse illegal drugs.  Limit alcohol intake to no more than 1 drink per day for nonpregnant women and 2 drinks per day for men. One drink equals 12 ounces of beer, 5 ounces of wine, or 1 ounces of hard liquor.  Take a multivitamin, if directed by your health care provider. SEEK MEDICAL CARE IF:   Your fatigue does not get better.  You have a fever.   You have unintentional weight loss or gain.  You have headaches.   You have difficulty:   Falling asleep.  Sleeping throughout the night.  You feel angry, guilty, anxious, or sad.   You are unable to have a bowel movement (constipation).   You skin is dry.   Your legs or another part of your body is swollen.  SEEK IMMEDIATE MEDICAL CARE IF:   You feel confused.   Your vision is blurry.  You feel faint or pass out.   You have a severe headache.   You have severe abdominal, pelvic, or back pain.   You have chest pain, shortness of breath, or an irregular or fast heartbeat.   You are unable to urinate or you urinate less than normal.   You develop abnormal bleeding, such as bleeding from the rectum, vagina, nose, lungs, or nipples.  You vomit blood.   You have thoughts about harming yourself or committing suicide.   You are  worried that you might harm someone else.    This information is not intended to replace advice given to you by your health care provider. Make sure you discuss any questions you have with your health care provider.   Document Released: 03/09/2007 Document Revised: 06/02/2014 Document Reviewed: 09/13/2013 Elsevier Interactive Patient Education 2016 [employer].  Constipation, Adult Constipation is when a person has fewer than three bowel movements a week, has difficulty having a bowel movement, or has stools that are dry, hard, or  larger than normal. As people grow older, constipation is more common. A low-fiber diet, not taking in enough fluids, and taking certain medicines may make constipation worse.  CAUSES   Certain medicines, such as antidepressants, pain medicine, iron supplements, antacids, and water pills.   Certain diseases, such as diabetes, irritable bowel syndrome (IBS), thyroid disease, or depression.   Not drinking enough water.   Not eating enough fiber-rich foods.   Stress or travel.   Lack of physical activity or exercise.   Ignoring the urge to have a bowel movement.   Using laxatives too much.  SIGNS AND SYMPTOMS   Having fewer than three bowel movements a week.   Straining to have a bowel movement.   Having stools that are hard, dry, or larger than normal.   Feeling full or bloated.   Pain in the lower abdomen.   Not feeling relief after having a bowel movement.  DIAGNOSIS  Your health care provider will take a medical history and perform a physical exam. Further testing may be done for severe constipation. Some tests may include:  A barium enema X-ray to examine your rectum, colon, and, sometimes, your small intestine.   A sigmoidoscopy to examine your lower colon.   A colonoscopy to examine your entire colon. TREATMENT  Treatment will depend on the severity of your constipation and what is causing it. Some dietary treatments include drinking more fluids and eating more fiber-rich foods. Lifestyle treatments may include regular exercise. If these diet and lifestyle recommendations do not help, your health care provider may recommend taking over-the-counter laxative medicines to help you have bowel movements. Prescription medicines may be prescribed if over-the-counter medicines do not work.  HOME CARE INSTRUCTIONS   Eat foods that have a lot of fiber, such as fruits, vegetables, whole grains, and beans.  Limit foods high in fat and processed sugars, such as  french fries, hamburgers, cookies, candies, and soda.   A fiber supplement may be added to your diet if you cannot get enough fiber from foods.   Drink enough fluids to keep your urine clear or pale yellow.   Exercise regularly or as directed by your health care provider.   Go to the restroom when you have the urge to go. Do not hold it.   Only take over-the-counter or prescription medicines as directed by your health care provider. Do not take other medicines for constipation without talking to your health care provider first.  [place] IF:   You have bright red blood in your stool.   Your constipation lasts for more than 4 days or gets worse.   You have abdominal or rectal pain.   You have thin, pencil-like stools.   You have unexplained weight loss. MAKE SURE YOU:   Understand these instructions.  Will watch your condition.  Will get help right away if you are not doing well or get worse.   This information is not intended to replace advice given  to you by your health care provider. Make sure you discuss any questions you have with your health care provider.   Document Released: 02/08/2004 Document Revised: 06/02/2014 Document Reviewed: 02/21/2013 Elsevier Interactive Patient Education [employer].

## 2016-02-14 ENCOUNTER — Ambulatory Visit: Payer: Medicare HMO | Admitting: Obstetrics and Gynecology

## 2016-02-15 ENCOUNTER — Ambulatory Visit: Payer: Medicare HMO | Admitting: Obstetrics and Gynecology

## 2016-02-18 ENCOUNTER — Other Ambulatory Visit: Payer: Self-pay | Admitting: Family

## 2016-02-18 ENCOUNTER — Ambulatory Visit (INDEPENDENT_AMBULATORY_CARE_PROVIDER_SITE_OTHER): Payer: Commercial Managed Care - HMO | Admitting: Family

## 2016-02-18 ENCOUNTER — Other Ambulatory Visit (INDEPENDENT_AMBULATORY_CARE_PROVIDER_SITE_OTHER): Payer: Commercial Managed Care - HMO

## 2016-02-18 ENCOUNTER — Encounter: Payer: Self-pay | Admitting: Family

## 2016-02-18 VITALS — BP 124/88 | HR 95 | Temp 97.8°F | Resp 16 | Ht 61.0 in | Wt 138.8 lb

## 2016-02-18 DIAGNOSIS — K5900 Constipation, unspecified: Secondary | ICD-10-CM | POA: Diagnosis not present

## 2016-02-18 DIAGNOSIS — H6690 Otitis media, unspecified, unspecified ear: Secondary | ICD-10-CM | POA: Insufficient documentation

## 2016-02-18 DIAGNOSIS — H6692 Otitis media, unspecified, left ear: Secondary | ICD-10-CM | POA: Diagnosis not present

## 2016-02-18 DIAGNOSIS — G629 Polyneuropathy, unspecified: Secondary | ICD-10-CM | POA: Diagnosis not present

## 2016-02-18 LAB — IBC PANEL
Iron: 55 ug/dL (ref 42–145)
Saturation Ratios: 14.6 % — ABNORMAL LOW (ref 20.0–50.0)
Transferrin: 269 mg/dL (ref 212.0–360.0)

## 2016-02-18 LAB — FERRITIN: Ferritin: 20.4 ng/mL (ref 10.0–291.0)

## 2016-02-18 MED ORDER — GABAPENTIN 300 MG PO CAPS: 300.0000 mg | ORAL_CAPSULE | Freq: Every day | ORAL | 0 refills | Status: DC

## 2016-02-18 MED ORDER — CEFDINIR 300 MG PO CAPS
300.0000 mg | ORAL_CAPSULE | Freq: Two times a day (BID) | ORAL | 0 refills | Status: DC
Start: 2016-02-18 — End: 2019-02-04

## 2016-02-18 MED ORDER — LUBIPROSTONE 24 MCG PO CAPS: 24.0000 ug | ORAL_CAPSULE | Freq: Two times a day (BID) | ORAL | 0 refills | Status: DC

## 2016-02-18 NOTE — Assessment & Plan Note (Signed)
Symptoms and exam consistent with otitis media. Start [place]. Follow-up if symptoms worsen or do not improve.

## 2016-02-18 NOTE — Progress Notes (Signed)
Subjective:    Patient ID: Melissa Dougherty, female    DOB: 1971/03/19, 45 y.o.   MRN: [id number]  Chief Complaint  Patient presents with  . Constipation    memory issues, feet burning, constipation, hearing issues    HPI:  Melissa Dougherty is a 45 y.o. female who  has a past medical history of Alcohol abuse; Allergy; Bipolar disorder ([place]); Depression; Drug abuse; GERD (gastroesophageal reflux disease); Heart murmur; and Seizures ([place]). and presents today for a follow up office visit.   1.) Constipation  - Recently evaluated in the office for constipation and noted to have constipation. She did have a bowel movement following a dose of the magnesium citrate. She has only taken the one dose. Also tried coffee, greens and pinto beans which have not helped. Is having regular flatulance.   2.) Elevated iron levels - During recent blood work patient was noted to have elevated iron saturations and iron levels. Denies any iron supplementation. No symptoms of liver dysfunction.  3.) Neuropathy - Previously diagnosed with neuropathy and recently tested negative for diabetes with an A1c of 5.6. Continues to experience neuropathy located in her bilateral feet have been going on for quite a while. Notes symptoms are generally worse at night. There are no modifying factors that make it better or worse.  4.) Ear pain - This is a new problem. Associated symptom of pain located in her left ear has been going on for about 1 month. Denies any fevers or discharge. No fevers. There are no factors that make it better or worse.    No Known Allergies    Outpatient Medications Prior to Visit  Medication Sig Dispense Refill  . divalproex (DEPAKOTE ER) 500 MG 24 hr tablet Take 500 mg by mouth at bedtime.  0  . Multiple Vitamin (MULTIVITAMIN WITH MINERALS) TABS tablet Take 1 tablet by mouth daily.    Marland Kitchen oxymetazoline (AFRIN NASAL SPRAY) 0.05 % nasal spray Place 1 spray into both nostrils 2 (two) times daily. 30  mL 0  . risperiDONE (RISPERDAL) 1 MG tablet Take 1 mg by mouth at bedtime.  0  . rizatriptan (MAXALT) 10 MG tablet Take 1 tablet by mouth at the onset of headache. May repeat in 2 hours if needed 12 tablet 0  . traMADol (ULTRAM) 50 MG tablet Take 1 tablet (50 mg total) by mouth every 6 (six) hours as needed. 7 tablet 0  . amoxicillin (AMOXIL) 500 MG capsule Take 1 capsule (500 mg total) by mouth 3 (three) times daily. 21 capsule 0   No facility-administered medications prior to visit.       Past Surgical History:  Procedure Laterality Date  . BACK SURGERY    . TUBAL LIGATION        Past Medical History:  Diagnosis Date  . Alcohol abuse   . Allergy   . Bipolar disorder ([place])   . Depression   . Drug abuse   . GERD (gastroesophageal reflux disease)   . Heart murmur   . Seizures ([place])    Last seizure was in 1989      Review of Systems  Constitutional: Negative for chills and fever.  Respiratory: Negative for cough, chest tightness and wheezing.   Cardiovascular: Negative for chest pain, palpitations and leg swelling.      Objective:    BP 124/88 (BP Location: Left Arm, Patient Position: Sitting, Cuff Size: Normal)   Pulse 95   Temp 97.8 F (36.6 C) (Oral)  Resp 16   Ht 5\' 1"  (1.549 m)   Wt 138 lb 12.8 oz (63 kg)   LMP 01/17/2016   SpO2 98%   BMI 26.23 kg/m  Nursing note and vital signs reviewed.  Physical Exam  Constitutional: She is oriented to person, place, and time. She appears well-developed and well-nourished. No distress.  HENT:  Right Ear: Hearing, tympanic membrane, external ear and ear canal normal.  Left Ear: No lacerations. No drainage, swelling or tenderness. A middle ear effusion is present.  Cardiovascular: Normal rate, regular rhythm, normal heart sounds and intact distal pulses.   Pulmonary/Chest: Effort normal and breath sounds normal.  Abdominal: Bowel sounds are normal. She exhibits no mass. There is no hepatosplenomegaly. There is no  tenderness. There is no rigidity, no rebound, no guarding, no tenderness at McBurney's point and negative Murphy's sign.  Neurological: She is alert and oriented to person, place, and time.  Skin: Skin is warm and dry.  Psychiatric: She has a normal mood and affect. Her behavior is normal. Judgment and thought content normal.       Assessment & Plan:   Problem List Items Addressed This Visit      Digestive   Constipation - Primary    Continues to experience associated symptom of constipation with mild relief from magnesium citrate. Start lubiprostone. Continue to drink plenty of fluids and increase fiber as tolerated. Follow-up pending trial of medication.      Relevant Medications   lubiprostone (AMITIZA) 24 MCG capsule     Nervous and Auditory   Neuropathy (HCC)    Continues to experience nondiabetic neuropathy with most recent A1c of 5.6. B12 also noted to be normal. Start gabapentin as needed for discomfort. Refer to neurology for further workup. Follow-up in one month or sooner if needed      Relevant Medications   gabapentin (NEURONTIN) 300 MG capsule   Other Relevant Orders   Ambulatory referral to Neurology   Otitis media    Symptoms and exam consistent with otitis media. Start [place]. Follow-up if symptoms worsen or do not improve.      Relevant Medications   cefdinir (OMNICEF) 300 MG capsule     Other   Increased storage iron    Previously noted to have increased levels of iron saturation and iron. There is concern for hemachromatosis as patient does not currently take iron. Obtain IBC panel, ferritin, and hemachromatosis DNA. There are no symptoms of end organ damage noted presently. Continue to monitor pending blood work results.      Relevant Orders   IBC panel   Ferritin   Hemochromatosis DNA-PCR(c282y,h63d)    Other Visit Diagnoses   None.      I have discontinued Ms. Beals's amoxicillin. I am also having her start on lubiprostone, cefdinir, and  gabapentin. Additionally, I am having her maintain her divalproex, risperiDONE, multivitamin with minerals, rizatriptan, traMADol, and oxymetazoline.   Meds ordered this encounter  Medications  . lubiprostone (AMITIZA) 24 MCG capsule    Sig: Take 1 capsule (24 mcg total) by mouth 2 (two) times daily with a meal.    Dispense:  60 capsule    Refill:  0    Order Specific Question:   Supervising Provider    Answer:   Pricilla Holm A [id number]  . cefdinir (OMNICEF) 300 MG capsule    Sig: Take 1 capsule (300 mg total) by mouth 2 (two) times daily.    Dispense:  14 capsule    Refill:  0    Order Specific Question:   Supervising Provider    Answer:   Pricilla Holm A [id number]  . gabapentin (NEURONTIN) 300 MG capsule    Sig: Take 1 capsule (300 mg total) by mouth at bedtime.    Dispense:  30 capsule    Refill:  0    Order Specific Question:   Supervising Provider    Answer:   Pricilla Holm A [id number]     Follow-up: Return in about 1 month (around 03/19/2016), or if symptoms worsen or fail to improve.  Mauricio Po, FNP

## 2016-02-18 NOTE — Assessment & Plan Note (Signed)
Previously noted to have increased levels of iron saturation and iron. There is concern for hemachromatosis as patient does not currently take iron. Obtain IBC panel, ferritin, and hemachromatosis DNA. There are no symptoms of end organ damage noted presently. Continue to monitor pending blood work results.

## 2016-02-18 NOTE — Assessment & Plan Note (Signed)
Continues to experience associated symptom of constipation with mild relief from magnesium citrate. Start lubiprostone. Continue to drink plenty of fluids and increase fiber as tolerated. Follow-up pending trial of medication.

## 2016-02-18 NOTE — Assessment & Plan Note (Signed)
Continues to experience nondiabetic neuropathy with most recent A1c of 5.6. B12 also noted to be normal. Start gabapentin as needed for discomfort. Refer to neurology for further workup. Follow-up in one month or sooner if needed

## 2016-02-18 NOTE — Patient Instructions (Signed)
Thank you for choosing [facility].  SUMMARY AND INSTRUCTIONS:  Medication:  Please start taking the [place] for constipation.  Start the Cefdinir for your ear.  Start  Gabapentin nightly.  Your prescription(s) have been submitted to your pharmacy or been printed and provided for you. Please take as directed and contact our office if you believe you are having problem(s) with the medication(s) or have any questions.  Labs:  Please stop by the lab on the lower level of the building for your blood work. Your results will be released to [place] (or called to you) after review, usually within 72 hours after test completion. If any changes need to be made, you will be notified at that same time.  1.) The lab is open from 7:30am to 5:30 pm Monday-Friday 2.) No appointment is necessary 3.) Fasting (if needed) is 6-8 hours after food and drink; black coffee and water are okay   Follow up:  They will call to schedule an appointment with neurology.   If your symptoms worsen or fail to improve, please contact our office for further instruction, or in case of emergency go directly to the emergency room at the closest medical facility.   Hemochromatosis Hemochromatosis, also called iron storage disease, is a condition in which the body stores too much iron. The excess iron builds up in your joints, heart, liver, pancreas, and other organs and damages them. CAUSES  Hemochromatosis may be caused by:  Abnormal genes. These are passed down (inherited) from both of your parents.  Having blood transfusions.  Having too much iron in your diet. RISK FACTORS  Being Caucasian.  Inheriting abnormal genes from both your parents.  Having a severe or long-term loss of red blood cells (anemia). SIGNS AND SYMPTOMS  Signs and symptoms can start at any age, but usually start in middle age. They may include:  Fatigue.  Weakness.  Joint pain.  Abdominal pain.  Weight loss.  Pearline Cables or  bronze skin coloring.  Loss of interest in sex.  Loss of menstrual periods in women.  Loss of body hair.  Shortness of breath. Late signs of hemochromatosis include damage to the liver, heart, or pancreas. This may lead to:  Liver cancer.  Abnormal heart rhythms.  Heart failure.  Diabetes. DIAGNOSIS  Your health care provider will perform a physical exam and ask about your symptoms and family history. Tests may be done to confirm the diagnosis. Blood tests may include:  Transferrin saturation. This test measures how much iron is bound to hemoglobin in your blood. Hemoglobin is a substance in red blood cells that carries oxygen to the tissues of the body.  Serum ferritin. This test measures a protein that stores iron in your blood.  A test to check for the abnormal genes that cause the condition. You may have a tissue sample taken from your liver with a needle (biopsy) for testing. The results will show if iron is building up in your liver. TREATMENT  Hemochromatosis is treated by removing iron by taking blood (phlebotomy). Having a phlebotomy is similar to having blood taken for donation. The procedure is simple and effective as long as it is started before organ damage develops. When you begin treatment, you may have a pint of blood removed once or twice a week. You may have blood tests done to determine when your iron levels return to normal. Once your iron levels are normal, you may only need to have a phlebotomy every few months. HOME CARE INSTRUCTIONS  Do not eat a lot of foods that are high in iron. Iron-rich foods include red meats and organ meats.  Do not take dietary supplements that contain iron.  Do not take vitamin C supplements. Vitamin C increases iron absorption.  Do not eat raw shellfish or raw fish. Hemochromatosis may increase your chance of infection from these foods.  If you have liver damage, do not drink any alcohol.  If you do not have liver damage,  limit alcohol intake to no more than 1 drink per day for nonpregnant women and 2 drinks per day for men. One drink equals 12 ounces of beer, 5 ounces of wine, or 1 ounces of hard liquor.  Try to exercise for at least 30 minutes on most days of the week.  Keep all follow-up visits as directed by your health care provider. This is important. SEEK MEDICAL CARE IF:  You have fatigue.  You have weakness.  You have joint pain.  You have abdominal pain.  You experience weight loss.  You have shortness of breath. SEEK IMMEDIATE MEDICAL CARE IF:  You have chest pain.  You have trouble breathing.   This information is not intended to replace advice given to you by your health care provider. Make sure you discuss any questions you have with your health care provider.   Document Released: 05/09/2000 Document Revised: 06/02/2014 Document Reviewed: 07/06/2013 Elsevier Interactive Patient Education [employer].

## 2016-02-22 ENCOUNTER — Ambulatory Visit
Admission: RE | Admit: 2016-02-22 | Discharge: 2016-02-22 | Disposition: A | Discharge status: Home / Self Care | Payer: Commercial Managed Care - HMO | Source: Ambulatory Visit | Attending: Family | Admitting: Family

## 2016-02-22 DIAGNOSIS — Z1231 Encounter for screening mammogram for malignant neoplasm of breast: Secondary | ICD-10-CM

## 2016-02-22 LAB — HEMOCHROMATOSIS DNA-PCR(C282Y,H63D)

## 2016-02-27 ENCOUNTER — Other Ambulatory Visit: Payer: Self-pay | Admitting: Family

## 2016-02-27 DIAGNOSIS — R928 Other abnormal and inconclusive findings on diagnostic imaging of breast: Secondary | ICD-10-CM

## 2016-02-29 ENCOUNTER — Other Ambulatory Visit: Payer: Self-pay | Admitting: Family

## 2016-02-29 ENCOUNTER — Other Ambulatory Visit: Payer: Self-pay

## 2016-02-29 DIAGNOSIS — F1020 Alcohol dependence, uncomplicated: Secondary | ICD-10-CM | POA: Diagnosis not present

## 2016-02-29 DIAGNOSIS — R928 Other abnormal and inconclusive findings on diagnostic imaging of breast: Secondary | ICD-10-CM

## 2016-02-29 DIAGNOSIS — R69 Illness, unspecified: Secondary | ICD-10-CM | POA: Diagnosis not present

## 2016-03-03 ENCOUNTER — Inpatient Hospital Stay: Admission: RE | Admit: 2016-03-03 | Payer: Self-pay | Source: Ambulatory Visit

## 2016-03-04 DIAGNOSIS — F3113 Bipolar disorder, current episode manic without psychotic features, severe: Secondary | ICD-10-CM | POA: Diagnosis not present

## 2016-03-04 DIAGNOSIS — K222 Esophageal obstruction: Secondary | ICD-10-CM | POA: Diagnosis not present

## 2016-03-05 ENCOUNTER — Ambulatory Visit
Admission: RE | Admit: 2016-03-05 | Discharge: 2016-03-05 | Disposition: A | Discharge status: Home / Self Care | Payer: Commercial Managed Care - HMO | Source: Ambulatory Visit | Attending: Family | Admitting: Family

## 2016-03-05 DIAGNOSIS — R928 Other abnormal and inconclusive findings on diagnostic imaging of breast: Secondary | ICD-10-CM

## 2016-03-05 DIAGNOSIS — N6489 Other specified disorders of breast: Secondary | ICD-10-CM | POA: Diagnosis not present

## 2016-03-05 DIAGNOSIS — R922 Inconclusive mammogram: Secondary | ICD-10-CM | POA: Diagnosis not present

## 2016-03-07 DIAGNOSIS — F1020 Alcohol dependence, uncomplicated: Secondary | ICD-10-CM | POA: Diagnosis not present

## 2016-03-14 DIAGNOSIS — F1020 Alcohol dependence, uncomplicated: Secondary | ICD-10-CM | POA: Diagnosis not present

## 2016-03-21 DIAGNOSIS — F1020 Alcohol dependence, uncomplicated: Secondary | ICD-10-CM | POA: Diagnosis not present

## 2016-04-01 DIAGNOSIS — F3113 Bipolar disorder, current episode manic without psychotic features, severe: Secondary | ICD-10-CM | POA: Diagnosis not present

## 2016-04-14 ENCOUNTER — Ambulatory Visit: Payer: Self-pay | Admitting: Family Medicine

## 2016-04-23 ENCOUNTER — Ambulatory Visit: Payer: Self-pay | Admitting: Neurology

## 2016-04-25 ENCOUNTER — Ambulatory Visit: Payer: Self-pay | Admitting: Neurology

## 2016-05-05 ENCOUNTER — Encounter: Payer: Self-pay | Admitting: Neurology

## 2016-07-04 DIAGNOSIS — F3113 Bipolar disorder, current episode manic without psychotic features, severe: Secondary | ICD-10-CM | POA: Diagnosis not present

## 2016-07-31 DIAGNOSIS — F3113 Bipolar disorder, current episode manic without psychotic features, severe: Secondary | ICD-10-CM | POA: Diagnosis not present

## 2016-08-11 DIAGNOSIS — F3113 Bipolar disorder, current episode manic without psychotic features, severe: Secondary | ICD-10-CM | POA: Diagnosis not present

## 2016-08-28 DIAGNOSIS — F3113 Bipolar disorder, current episode manic without psychotic features, severe: Secondary | ICD-10-CM | POA: Diagnosis not present

## 2016-09-04 ENCOUNTER — Ambulatory Visit (INDEPENDENT_AMBULATORY_CARE_PROVIDER_SITE_OTHER): Payer: Self-pay | Admitting: Physician Assistant

## 2016-09-06 DIAGNOSIS — H524 Presbyopia: Secondary | ICD-10-CM | POA: Diagnosis not present

## 2016-09-25 DIAGNOSIS — F3113 Bipolar disorder, current episode manic without psychotic features, severe: Secondary | ICD-10-CM | POA: Diagnosis not present

## 2016-10-23 DIAGNOSIS — F3113 Bipolar disorder, current episode manic without psychotic features, severe: Secondary | ICD-10-CM | POA: Diagnosis not present

## 2016-11-20 DIAGNOSIS — F3113 Bipolar disorder, current episode manic without psychotic features, severe: Secondary | ICD-10-CM | POA: Diagnosis not present

## 2016-12-23 DIAGNOSIS — F3113 Bipolar disorder, current episode manic without psychotic features, severe: Secondary | ICD-10-CM | POA: Diagnosis not present

## 2017-06-23 DIAGNOSIS — F3113 Bipolar disorder, current episode manic without psychotic features, severe: Secondary | ICD-10-CM | POA: Diagnosis not present

## 2017-06-24 DIAGNOSIS — F3113 Bipolar disorder, current episode manic without psychotic features, severe: Secondary | ICD-10-CM | POA: Diagnosis not present

## 2017-07-28 DIAGNOSIS — F3113 Bipolar disorder, current episode manic without psychotic features, severe: Secondary | ICD-10-CM | POA: Diagnosis not present

## 2017-09-03 DIAGNOSIS — F3113 Bipolar disorder, current episode manic without psychotic features, severe: Secondary | ICD-10-CM | POA: Diagnosis not present

## 2017-09-23 DIAGNOSIS — F3113 Bipolar disorder, current episode manic without psychotic features, severe: Secondary | ICD-10-CM | POA: Diagnosis not present

## 2017-10-01 DIAGNOSIS — F3113 Bipolar disorder, current episode manic without psychotic features, severe: Secondary | ICD-10-CM | POA: Diagnosis not present

## 2017-10-03 DIAGNOSIS — H524 Presbyopia: Secondary | ICD-10-CM | POA: Diagnosis not present

## 2017-10-28 DIAGNOSIS — F3113 Bipolar disorder, current episode manic without psychotic features, severe: Secondary | ICD-10-CM | POA: Diagnosis not present

## 2017-11-25 DIAGNOSIS — F3113 Bipolar disorder, current episode manic without psychotic features, severe: Secondary | ICD-10-CM | POA: Diagnosis not present

## 2017-12-09 DIAGNOSIS — F3113 Bipolar disorder, current episode manic without psychotic features, severe: Secondary | ICD-10-CM | POA: Diagnosis not present

## 2017-12-23 DIAGNOSIS — F3113 Bipolar disorder, current episode manic without psychotic features, severe: Secondary | ICD-10-CM | POA: Diagnosis not present

## 2018-01-20 DIAGNOSIS — F3113 Bipolar disorder, current episode manic without psychotic features, severe: Secondary | ICD-10-CM | POA: Diagnosis not present

## 2018-02-17 DIAGNOSIS — F3113 Bipolar disorder, current episode manic without psychotic features, severe: Secondary | ICD-10-CM | POA: Diagnosis not present

## 2018-03-11 DIAGNOSIS — F3113 Bipolar disorder, current episode manic without psychotic features, severe: Secondary | ICD-10-CM | POA: Diagnosis not present

## 2018-03-17 DIAGNOSIS — F3113 Bipolar disorder, current episode manic without psychotic features, severe: Secondary | ICD-10-CM | POA: Diagnosis not present

## 2018-04-14 DIAGNOSIS — F3113 Bipolar disorder, current episode manic without psychotic features, severe: Secondary | ICD-10-CM | POA: Diagnosis not present

## 2018-05-13 DIAGNOSIS — F3113 Bipolar disorder, current episode manic without psychotic features, severe: Secondary | ICD-10-CM | POA: Diagnosis not present

## 2018-06-08 DIAGNOSIS — F3113 Bipolar disorder, current episode manic without psychotic features, severe: Secondary | ICD-10-CM | POA: Diagnosis not present

## 2018-06-15 DIAGNOSIS — F3113 Bipolar disorder, current episode manic without psychotic features, severe: Secondary | ICD-10-CM | POA: Diagnosis not present

## 2018-07-13 DIAGNOSIS — F3113 Bipolar disorder, current episode manic without psychotic features, severe: Secondary | ICD-10-CM | POA: Diagnosis not present

## 2018-08-09 DIAGNOSIS — H903 Sensorineural hearing loss, bilateral: Secondary | ICD-10-CM | POA: Diagnosis not present

## 2018-08-09 DIAGNOSIS — H6123 Impacted cerumen, bilateral: Secondary | ICD-10-CM | POA: Diagnosis not present

## 2018-08-09 HISTORY — DX: Impacted cerumen, bilateral: H61.23

## 2018-08-10 DIAGNOSIS — F3113 Bipolar disorder, current episode manic without psychotic features, severe: Secondary | ICD-10-CM | POA: Diagnosis not present

## 2018-09-07 DIAGNOSIS — F3113 Bipolar disorder, current episode manic without psychotic features, severe: Secondary | ICD-10-CM | POA: Diagnosis not present

## 2018-11-11 DIAGNOSIS — F3113 Bipolar disorder, current episode manic without psychotic features, severe: Secondary | ICD-10-CM | POA: Diagnosis not present

## 2018-12-09 DIAGNOSIS — F3113 Bipolar disorder, current episode manic without psychotic features, severe: Secondary | ICD-10-CM | POA: Diagnosis not present

## 2018-12-17 DIAGNOSIS — F3113 Bipolar disorder, current episode manic without psychotic features, severe: Secondary | ICD-10-CM | POA: Diagnosis not present

## 2018-12-24 ENCOUNTER — Other Ambulatory Visit: Payer: Self-pay | Admitting: Family Medicine

## 2018-12-24 DIAGNOSIS — R1310 Dysphagia, unspecified: Secondary | ICD-10-CM | POA: Diagnosis not present

## 2018-12-24 DIAGNOSIS — F310 Bipolar disorder, current episode hypomanic: Secondary | ICD-10-CM | POA: Diagnosis not present

## 2018-12-28 ENCOUNTER — Other Ambulatory Visit (HOSPITAL_COMMUNITY): Payer: Self-pay | Admitting: Family Medicine

## 2018-12-28 ENCOUNTER — Ambulatory Visit
Admission: RE | Admit: 2018-12-28 | Discharge: 2018-12-28 | Disposition: A | Discharge status: Home / Self Care | Payer: Medicare HMO | Source: Ambulatory Visit | Attending: Family Medicine | Admitting: Family Medicine

## 2018-12-28 ENCOUNTER — Encounter (HOSPITAL_COMMUNITY): Payer: Self-pay

## 2018-12-28 ENCOUNTER — Ambulatory Visit (HOSPITAL_COMMUNITY): Admission: RE | Admit: 2018-12-28 | Payer: Commercial Managed Care - HMO | Source: Ambulatory Visit

## 2018-12-28 DIAGNOSIS — R1310 Dysphagia, unspecified: Secondary | ICD-10-CM

## 2018-12-28 DIAGNOSIS — K219 Gastro-esophageal reflux disease without esophagitis: Secondary | ICD-10-CM | POA: Diagnosis not present

## 2018-12-28 DIAGNOSIS — K449 Diaphragmatic hernia without obstruction or gangrene: Secondary | ICD-10-CM | POA: Diagnosis not present

## 2019-01-11 DIAGNOSIS — E6609 Other obesity due to excess calories: Secondary | ICD-10-CM | POA: Diagnosis not present

## 2019-01-11 DIAGNOSIS — F310 Bipolar disorder, current episode hypomanic: Secondary | ICD-10-CM | POA: Diagnosis not present

## 2019-01-11 DIAGNOSIS — K210 Gastro-esophageal reflux disease with esophagitis: Secondary | ICD-10-CM | POA: Diagnosis not present

## 2019-01-11 DIAGNOSIS — R1310 Dysphagia, unspecified: Secondary | ICD-10-CM | POA: Diagnosis not present

## 2019-01-14 DIAGNOSIS — K219 Gastro-esophageal reflux disease without esophagitis: Secondary | ICD-10-CM | POA: Diagnosis not present

## 2019-01-14 DIAGNOSIS — K210 Gastro-esophageal reflux disease with esophagitis: Secondary | ICD-10-CM | POA: Diagnosis not present

## 2019-01-14 DIAGNOSIS — R140 Abdominal distension (gaseous): Secondary | ICD-10-CM | POA: Diagnosis not present

## 2019-01-14 DIAGNOSIS — E789 Disorder of lipoprotein metabolism, unspecified: Secondary | ICD-10-CM | POA: Diagnosis not present

## 2019-01-17 DIAGNOSIS — M130 Polyarthritis, unspecified: Secondary | ICD-10-CM | POA: Diagnosis not present

## 2019-01-17 DIAGNOSIS — K210 Gastro-esophageal reflux disease with esophagitis: Secondary | ICD-10-CM | POA: Diagnosis not present

## 2019-01-18 DIAGNOSIS — F3113 Bipolar disorder, current episode manic without psychotic features, severe: Secondary | ICD-10-CM | POA: Diagnosis not present

## 2019-01-24 ENCOUNTER — Encounter (HOSPITAL_COMMUNITY): Payer: Self-pay

## 2019-01-24 ENCOUNTER — Ambulatory Visit (INDEPENDENT_AMBULATORY_CARE_PROVIDER_SITE_OTHER): Payer: Medicare HMO

## 2019-01-24 ENCOUNTER — Ambulatory Visit (HOSPITAL_COMMUNITY)
Admission: EM | Admit: 2019-01-24 | Discharge: 2019-01-24 | Disposition: A | Discharge status: Home / Self Care | Payer: Medicare HMO | Attending: Emergency Medicine | Admitting: Emergency Medicine

## 2019-01-24 ENCOUNTER — Other Ambulatory Visit: Payer: Self-pay

## 2019-01-24 DIAGNOSIS — K5900 Constipation, unspecified: Secondary | ICD-10-CM

## 2019-01-24 MED ORDER — POLYETHYLENE GLYCOL 3350 17 G PO PACK: 17.0000 g | PACK | Freq: Every day | ORAL | 0 refills | Status: DC

## 2019-01-24 MED ORDER — MAGNESIUM HYDROXIDE 400 MG/5ML PO SUSP: 15.0000 mL | Freq: Every day | ORAL | 0 refills | Status: DC | PRN

## 2019-01-24 MED ORDER — MINERAL OIL RE ENEM: 1.0000 | ENEMA | Freq: Once | RECTAL | 0 refills | Status: AC

## 2019-01-24 NOTE — ED Notes (Signed)
Pt states she has constipation. She states she has to use a laxative everyday.

## 2019-01-24 NOTE — Discharge Instructions (Addendum)
Please try using mira lax daily with dulcolax; If bowels still not moving may try enema or milk of magesia to supplement  Incorporate more fiber in diet  Drink plenty of water

## 2019-01-25 NOTE — ED Provider Notes (Signed)
[place]    CSN: [id number] Arrival date & time: 01/24/19  1742      History   Chief Complaint Chief Complaint  Patient presents with  . Constipation    HPI Melissa Dougherty is a 48 y.o. female history of GERD, bipolar disorder, alcohol use, previous tubal ligation and esophageal surgery presenting today for evaluation of constipation.  Patient states that over the past 2 weeks she has had approximately 1-2 bowel movements.  She had a very small bowel movement this morning after taking 2 Dulcolax.  She has had chronic issues with constipation.  Usually relieved with using Dulcolax or Ex-Lax.  She does not use any daily medicines.  She has had some bloating sensations, but denies abdominal pain.  Denies nausea or vomiting.  Has been able to continue to tolerate oral intake.  She declines passing gas.  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  Denies blood in stool.  Denies rectal pain.  HPI  Past Medical History:  Diagnosis Date  . Alcohol abuse   . Allergy   . Bipolar disorder ([place])   . Depression   . Drug abuse ([place])   . GERD (gastroesophageal reflux disease)   . Heart murmur   . Seizures ([place])    Last seizure was in 1989    Patient Active Problem List   Diagnosis Date Noted  . Increased storage iron 02/18/2016  . Neuropathy 02/18/2016  . Otitis media 02/18/2016  . Fatigue 02/07/2016  . Constipation 02/07/2016  . Acute upper respiratory infection 01/25/2016  . Headache 12/25/2015  . Routine general medical examination at a health care facility 11/01/2015  . Medicare welcome exam 11/01/2015  . Abdominal bloating 10/16/2015  . Bipolar I disorder, most recent episode (or current) manic ([place]) 07/23/2015  . Bipolar I disorder, most recent episode (or current) mixed, moderate 12/24/2013  . Adjustment disorder with mixed anxiety and depressed mood 12/23/2013  . MDD (major depressive disorder) 12/23/2013  .  Depressive disorder 12/22/2013  . Papanicolaou smear of cervix with atypical squamous cells of undetermined significance (ASC-US) 10/12/2013  . DEPRESSION 02/17/2007  . GERD 02/17/2007  . SEIZURE DISORDER, HX OF 02/17/2007    Past Surgical History:  Procedure Laterality Date  . BACK SURGERY    . TUBAL LIGATION      OB History    Gravida  2   Para  2   Term  2   Preterm      AB      Living  2     SAB      TAB      Ectopic      Multiple      Live Births  2            Home Medications    Prior to Admission medications   Medication Sig Start Date End Date Taking? Authorizing Provider  cefdinir (OMNICEF) 300 MG capsule Take 1 capsule (300 mg total) by mouth 2 (two) times daily. 02/18/16   Golden Circle, FNP  divalproex (DEPAKOTE ER) 500 MG 24 hr tablet Take 500 mg by mouth at bedtime. 08/30/15   [provider]  gabapentin (NEURONTIN) 300 MG capsule TAKE 1 CAPSULE(300 MG) BY MOUTH AT BEDTIME 02/19/16   Golden Circle, FNP  lubiprostone (AMITIZA) 24 MCG capsule Take 1 capsule (24 mcg total) by mouth 2 (two) times daily with a meal. 02/18/16   Mauricio Po  D, FNP  magnesium hydroxide (MILK OF MAGNESIA) 400 MG/5ML suspension Take 15 mLs by mouth daily as needed for mild constipation. 01/24/19   [place] C, PA-C  Multiple Vitamin (MULTIVITAMIN WITH MINERALS) TABS tablet Take 1 tablet by mouth daily.    [provider]  oxymetazoline (AFRIN NASAL SPRAY) 0.05 % nasal spray Place 1 spray into both nostrils 2 (two) times daily. 02/02/16   Delos Haring, PA-C  polyethylene glycol (MIRALAX / GLYCOLAX) 17 g packet Take 17 g by mouth daily. 01/24/19   Salik Grewell C, PA-C  risperiDONE (RISPERDAL) 1 MG tablet Take 1 mg by mouth at bedtime. 08/30/15   [provider]  rizatriptan (MAXALT) 10 MG tablet Take 1 tablet by mouth at the onset of headache. May repeat in 2 hours if needed 01/02/16   Golden Circle, FNP  traMADol (ULTRAM) 50 MG  tablet Take 1 tablet (50 mg total) by mouth every 6 (six) hours as needed. 01/06/16   Montine Circle, PA-C    Family History Family History  Problem Relation Age of Onset  . Diabetes Mother   . Hypertension Mother   . Alcohol abuse Father     Social History Social History   Tobacco Use  . Smoking status: Never Smoker  . Smokeless tobacco: Never Used  Substance Use Topics  . Alcohol use: No  . Drug use: No     Allergies   Patient has no known allergies.   Review of Systems Review of Systems  Constitutional: Negative for fever.  Respiratory: Negative for shortness of breath.   Cardiovascular: Negative for chest pain.  Gastrointestinal: Positive for constipation. Negative for abdominal pain, diarrhea, nausea and vomiting.  Genitourinary: Negative for dysuria, flank pain, genital sores, hematuria, menstrual problem, vaginal bleeding, vaginal discharge and vaginal pain.  Musculoskeletal: Negative for back pain.  Skin: Negative for rash.  Neurological: Negative for dizziness, light-headedness and headaches.     Physical Exam Triage Vital Signs ED Triage Vitals  Enc Vitals Group     BP 01/24/19 1836 (!) 137/99     Pulse Rate 01/24/19 1836 95     Resp 01/24/19 1836 18     Temp 01/24/19 1836 97.8 F (36.6 C)     Temp Source 01/24/19 1836 Oral     SpO2 01/24/19 1836 100 %     Weight 01/24/19 1837 175 lb 3.2 oz (79.5 kg)     Height --      Head Circumference --      Peak Flow --      Pain Score 01/24/19 1837 7     Pain Loc --      Pain Edu? --      Excl. in [place]? --    No data found.  Updated Vital Signs BP (!) 137/99 (BP Location: Right Arm)   Pulse 95   Temp 97.8 F (36.6 C) (Oral)   Resp 18   Wt 175 lb 3.2 oz (79.5 kg)   SpO2 100%   BMI 33.10 kg/m   Visual Acuity Right Eye Distance:   Left Eye Distance:   Bilateral Distance:    Right Eye Near:   Left Eye Near:    Bilateral Near:     Physical Exam Vitals signs and nursing note reviewed.   Constitutional:      General: She is not in acute distress.    Appearance: She is well-developed.  HENT:     Head: Normocephalic and atraumatic.  Eyes:  Conjunctiva/sclera: Conjunctivae normal.  Neck:     Musculoskeletal: Neck supple.  Cardiovascular:     Rate and Rhythm: Normal rate and regular rhythm.     Heart sounds: No murmur.  Pulmonary:     Effort: Pulmonary effort is normal. No respiratory distress.     Breath sounds: Normal breath sounds.     Comments: Breathing comfortably at rest, CTABL, no wheezing, rales or other adventitious sounds auscultated Abdominal:     Palpations: Abdomen is soft.     Tenderness: There is no abdominal tenderness.     Comments: Large well-healed surgical scar extending through upper part of abdomen, soft, slightly distended, dull to percussion, no tenderness to palpation throughout all 4 quadrants and epigastrium  Skin:    General: Skin is warm and dry.  Neurological:     Mental Status: She is alert.      UC Treatments / Results  Labs (all labs ordered are listed, but only abnormal results are displayed) Labs Reviewed - No data to display  EKG   Radiology Dg Abd 2 Views  Result Date: 01/24/2019 CLINICAL DATA:  Constipation x 2 weeks. No pain. No nausea or vomiting. Hx of tubal ligation surgery. Per provider: 2 weeks constipation, history of previous surgery, r/o obstruction EXAM: ABDOMEN - 2 VIEW COMPARISON:  07/01/2015 CT FINDINGS: Bowel gas pattern is nonobstructive. Small to moderate stool burden. Surgical clips are identified in the central abdomen and epigastric region. No evidence for free intraperitoneal air. IMPRESSION: No evidence for acute abnormality. Electronically Signed   By: Nolon Nations M.D.   On: 01/24/2019 19:52    Procedures Procedures (including critical care time)  Medications Ordered in UC Medications - No data to display  Initial Impression / Assessment and Plan / UC Course  I have reviewed the triage  vital signs and the nursing notes.  Pertinent labs & imaging results that were available during my care of the patient were reviewed by me and considered in my medical decision making (see chart for details).     Given history of previous abdominal surgeries and symptoms x2 weeks, will obtain abdominal film to rule out obstruction.  No signs of obstruction on x-ray.  Will add in MiraLAX daily to take with Dulcolax.  Provided milk of magnesia to use as needed or enemas.  Advised not to use these together, use one or the other.  Discussed lifestyle modifications of incorporating more fiber and drinking plenty of fluids.Discussed strict return precautions. Patient verbalized understanding and is agreeable with plan.  Final Clinical Impressions(s) / UC Diagnoses   Final diagnoses:  Constipation, unspecified constipation type     Discharge Instructions     Please try using mira lax daily with dulcolax; If bowels still not moving may try enema or milk of magesia to supplement  Incorporate more fiber in diet  Drink plenty of water   ED Prescriptions    Medication Sig Dispense Auth. Provider   mineral oil enema Place 133 mLs (1 enema total) rectally once for 1 dose. 133 mL Murice Barbar C, PA-C   magnesium hydroxide (MILK OF MAGNESIA) 400 MG/5ML suspension Take 15 mLs by mouth daily as needed for mild constipation. 355 mL Bliss Behnke C, PA-C   polyethylene glycol (MIRALAX / GLYCOLAX) 17 g packet Take 17 g by mouth daily. 14 each Gardiner Espana C, PA-C     Controlled Substance Prescriptions [place] Controlled Substance Registry consulted? Not Applicable   Janith Lima, [place] 01/25/19 1132

## 2019-01-26 DIAGNOSIS — R600 Localized edema: Secondary | ICD-10-CM | POA: Diagnosis not present

## 2019-01-26 DIAGNOSIS — I10 Essential (primary) hypertension: Secondary | ICD-10-CM | POA: Diagnosis not present

## 2019-01-26 DIAGNOSIS — B9681 Helicobacter pylori [H. pylori] as the cause of diseases classified elsewhere: Secondary | ICD-10-CM | POA: Diagnosis not present

## 2019-01-27 ENCOUNTER — Encounter (HOSPITAL_COMMUNITY): Payer: Self-pay | Admitting: *Deleted

## 2019-01-27 ENCOUNTER — Emergency Department (HOSPITAL_COMMUNITY)
Admission: EM | Admit: 2019-01-27 | Discharge: 2019-01-28 | Disposition: A | Discharge status: Home / Self Care | Payer: Medicare HMO | Attending: Emergency Medicine | Admitting: Emergency Medicine

## 2019-01-27 DIAGNOSIS — R609 Edema, unspecified: Secondary | ICD-10-CM | POA: Diagnosis not present

## 2019-01-27 DIAGNOSIS — R000 Tachycardia, unspecified: Secondary | ICD-10-CM | POA: Diagnosis not present

## 2019-01-27 DIAGNOSIS — Z79899 Other long term (current) drug therapy: Secondary | ICD-10-CM | POA: Insufficient documentation

## 2019-01-27 DIAGNOSIS — R600 Localized edema: Secondary | ICD-10-CM | POA: Diagnosis not present

## 2019-01-27 LAB — CBC
HCT: 38.7 % (ref 36.0–46.0)
Hemoglobin: 12.3 g/dL (ref 12.0–15.0)
MCH: 21.5 pg — ABNORMAL LOW (ref 26.0–34.0)
MCHC: 31.8 g/dL (ref 30.0–36.0)
MCV: 67.7 fL — ABNORMAL LOW (ref 80.0–100.0)
Platelets: 227 10*3/uL (ref 150–400)
RBC: 5.72 MIL/uL — ABNORMAL HIGH (ref 3.87–5.11)
RDW: 19.3 % — ABNORMAL HIGH (ref 11.5–15.5)
WBC: 8.9 10*3/uL (ref 4.0–10.5)
nRBC: 0 % (ref 0.0–0.2)

## 2019-01-27 LAB — URINALYSIS, ROUTINE W REFLEX MICROSCOPIC
Bilirubin Urine: NEGATIVE
Glucose, UA: NEGATIVE mg/dL
Hgb urine dipstick: NEGATIVE
Ketones, ur: NEGATIVE mg/dL
Leukocytes,Ua: NEGATIVE
Nitrite: NEGATIVE
Protein, ur: NEGATIVE mg/dL
Specific Gravity, Urine: 1.001 — ABNORMAL LOW (ref 1.005–1.030)
pH: 6 (ref 5.0–8.0)

## 2019-01-27 LAB — BASIC METABOLIC PANEL
Anion gap: 12 (ref 5–15)
BUN: 8 mg/dL (ref 6–20)
CO2: 20 mmol/L — ABNORMAL LOW (ref 22–32)
Calcium: 8.4 mg/dL — ABNORMAL LOW (ref 8.9–10.3)
Chloride: 102 mmol/L (ref 98–111)
Creatinine, Ser: 1.01 mg/dL — ABNORMAL HIGH (ref 0.44–1.00)
GFR calc Af Amer: >60 mL/min (ref >60)
GFR calc non Af Amer: >60 mL/min (ref >60)
Glucose, Bld: 94 mg/dL (ref 70–99)
Potassium: 4 mmol/L (ref 3.5–5.1)
Sodium: 134 mmol/L — ABNORMAL LOW (ref 135–145)

## 2019-01-27 LAB — I-STAT BETA HCG BLOOD, ED (MC, WL, AP ONLY): I-stat hCG, quantitative: <5 m[IU]/mL (ref <5)

## 2019-01-27 MED ORDER — SODIUM CHLORIDE 0.9% FLUSH: 3.0000 mL | Freq: Once | INTRAVENOUS | Status: DC

## 2019-01-27 NOTE — ED Triage Notes (Signed)
To ED, via [facility], for eval of bilateral leg swelling. Pt states denies sob. Pt appears in nad. Per EMS, pt called 911 and pt's house-mates called 911 stating she wasn't acting herself. Pt appears to be at baseline. Answering all questions appropriately.

## 2019-01-28 MED ORDER — ACETAMINOPHEN 325 MG PO TABS
650.0000 mg | ORAL_TABLET | Freq: Once | ORAL | Status: AC
  Administered 2019-01-28: 650 mg via ORAL
  Filled 2019-01-28: qty 2

## 2019-01-28 NOTE — ED Notes (Signed)
Patient able to get out of bed and walk with out any complaints.

## 2019-01-28 NOTE — ED Notes (Signed)
ED Provider at bedside. 

## 2019-01-28 NOTE — ED Notes (Signed)
PT IS LOCATED IN [facility].

## 2019-01-28 NOTE — Discharge Instructions (Signed)
Thank you for allowing me to care for you today in the Emergency Department.   Unfortunately, we did not have compression stockings available in the ER today.  You can have these especially ordered by your primary care provider, but there are some different types available over-the-counter.  Cutting back on the amount of salt in your diet can also help with swelling in your lower legs and feet.  Elevate your legs when you are sitting and resting to help with swelling.  Return to the emergency department if you develop significant shortness of breath, severe swelling and hardness in your abdomen, if one leg is significantly swollen or painful, or other new, concerning symptoms.

## 2019-01-28 NOTE — ED Provider Notes (Signed)
[facility] EMERGENCY DEPARTMENT Provider Note   CSN: [id number] Arrival date & time: 01/27/19  2144     History   Chief Complaint Chief Complaint  Patient presents with  . Leg Swelling    HPI Melissa Dougherty is a 48 y.o. female with history of bipolar disorder, peripheral neuropathy, alcohol abuse, drug abuse, and seizures who presents to the emergency department with a chief complaint of bilateral lower extremity edema for the last 2 weeks.  She reports that swelling is worse at the end of the day.  She does report that she has been spending more time sitting over the last few weeks.  She also has been taking more frequent medications as she has been struggling with constipation.  Last bowel movement was yesterday. She denies lower extremity pain, recent falls, numbness, joint pain or swelling, weakness, chest pain, shortness of breath, fever, or chills.     The history is provided by the patient. No language interpreter was used.    Past Medical History:  Diagnosis Date  . Alcohol abuse   . Allergy   . Bipolar disorder ([place])   . Depression   . Drug abuse ([place])   . GERD (gastroesophageal reflux disease)   . Heart murmur   . Seizures ([place])    Last seizure was in 1989    Patient Active Problem List   Diagnosis Date Noted  . Increased storage iron 02/18/2016  . Neuropathy 02/18/2016  . Otitis media 02/18/2016  . Fatigue 02/07/2016  . Constipation 02/07/2016  . Acute upper respiratory infection 01/25/2016  . Headache 12/25/2015  . Routine general medical examination at a health care facility 11/01/2015  . Medicare welcome exam 11/01/2015  . Abdominal bloating 10/16/2015  . Bipolar I disorder, most recent episode (or current) manic ([place]) 07/23/2015  . Bipolar I disorder, most recent episode (or current) mixed, moderate 12/24/2013  . Adjustment disorder with mixed anxiety and depressed mood 12/23/2013  . MDD (major depressive disorder) 12/23/2013  .  Depressive disorder 12/22/2013  . Papanicolaou smear of cervix with atypical squamous cells of undetermined significance (ASC-US) 10/12/2013  . DEPRESSION 02/17/2007  . GERD 02/17/2007  . SEIZURE DISORDER, HX OF 02/17/2007    Past Surgical History:  Procedure Laterality Date  . BACK SURGERY    . TUBAL LIGATION       OB History    Gravida  2   Para  2   Term  2   Preterm      AB      Living  2     SAB      TAB      Ectopic      Multiple      Live Births  2            Home Medications    Prior to Admission medications   Medication Sig Start Date End Date Taking? Authorizing Provider  cefdinir (OMNICEF) 300 MG capsule Take 1 capsule (300 mg total) by mouth 2 (two) times daily. 02/18/16   Golden Circle, FNP  divalproex (DEPAKOTE ER) 500 MG 24 hr tablet Take 500 mg by mouth at bedtime. 08/30/15   [provider]  gabapentin (NEURONTIN) 300 MG capsule TAKE 1 CAPSULE(300 MG) BY MOUTH AT BEDTIME 02/19/16   Golden Circle, FNP  lubiprostone (AMITIZA) 24 MCG capsule Take 1 capsule (24 mcg total) by mouth 2 (two) times daily with a meal. 02/18/16   Golden Circle, FNP  magnesium hydroxide (MILK OF MAGNESIA) 400 MG/5ML suspension Take 15 mLs by mouth daily as needed for mild constipation. 01/24/19   Wieters, Hallie C, PA-C  Multiple Vitamin (MULTIVITAMIN WITH MINERALS) TABS tablet Take 1 tablet by mouth daily.    [provider]  oxymetazoline (AFRIN NASAL SPRAY) 0.05 % nasal spray Place 1 spray into both nostrils 2 (two) times daily. 02/02/16   Delos Haring, PA-C  polyethylene glycol (MIRALAX / GLYCOLAX) 17 g packet Take 17 g by mouth daily. 01/24/19   Wieters, Hallie C, PA-C  risperiDONE (RISPERDAL) 1 MG tablet Take 1 mg by mouth at bedtime. 08/30/15   [provider]  rizatriptan (MAXALT) 10 MG tablet Take 1 tablet by mouth at the onset of headache. May repeat in 2 hours if needed 01/02/16   Golden Circle, FNP  traMADol (ULTRAM) 50 MG  tablet Take 1 tablet (50 mg total) by mouth every 6 (six) hours as needed. 01/06/16   Montine Circle, PA-C    Family History Family History  Problem Relation Age of Onset  . Diabetes Mother   . Hypertension Mother   . Alcohol abuse Father     Social History Social History   Tobacco Use  . Smoking status: Never Smoker  . Smokeless tobacco: Never Used  Substance Use Topics  . Alcohol use: No  . Drug use: No     Allergies   Patient has no known allergies.   Review of Systems Review of Systems  Constitutional: Negative for activity change, chills and fever.  Respiratory: Negative for shortness of breath.   Cardiovascular: Positive for leg swelling. Negative for chest pain.  Gastrointestinal: Negative for abdominal pain, blood in stool, nausea and vomiting.  Genitourinary: Negative for dysuria.  Musculoskeletal: Negative for back pain.  Skin: Negative for rash.  Allergic/Immunologic: Negative for immunocompromised state.  Neurological: Negative for headaches.  Psychiatric/Behavioral: Negative for confusion.     Physical Exam Updated Vital Signs BP 125/79 (BP Location: Right Arm)   Pulse 85   Temp 97.7 F (36.5 C) (Oral)   Resp 16   SpO2 100%   Physical Exam Vitals signs and nursing note reviewed.  Constitutional:      General: She is not in acute distress. HENT:     Head: Normocephalic.  Eyes:     Conjunctiva/sclera: Conjunctivae normal.  Neck:     Musculoskeletal: Neck supple.  Cardiovascular:     Rate and Rhythm: Normal rate and regular rhythm.     Heart sounds: No murmur. No friction rub. No gallop.   Pulmonary:     Effort: Pulmonary effort is normal. No respiratory distress.     Breath sounds: No stridor. No wheezing, rhonchi or rales.  Chest:     Chest wall: No tenderness.  Abdominal:     General: There is no distension.     Palpations: Abdomen is soft. There is no mass.     Tenderness: There is no abdominal tenderness. There is no right CVA  tenderness, left CVA tenderness, guarding or rebound.     Hernia: No hernia is present.  Musculoskeletal:     Right lower leg: Edema present.     Left lower leg: Edema present.     Comments: Trace edema to the bilateral lower extremities.  No tenderness palpation of the bilateral lower extremities.  Negative Homans sign. NVI.  Ambulatory without difficulty.  Skin:    General: Skin is warm.     Findings: No rash.  Neurological:  Mental Status: She is alert.  Psychiatric:        Behavior: Behavior normal.      ED Treatments / Results  Labs (all labs ordered are listed, but only abnormal results are displayed) Labs Reviewed  BASIC METABOLIC PANEL - Abnormal; Notable for the following components:      Result Value   Sodium 134 (*)    CO2 20 (*)    Creatinine, Ser 1.01 (*)    Calcium 8.4 (*)    All other components within normal limits  CBC - Abnormal; Notable for the following components:   RBC 5.72 (*)    MCV 67.7 (*)    MCH 21.5 (*)    RDW 19.3 (*)    All other components within normal limits  URINALYSIS, ROUTINE W REFLEX MICROSCOPIC - Abnormal; Notable for the following components:   Color, Urine COLORLESS (*)    Specific Gravity, Urine 1.001 (*)    All other components within normal limits  I-STAT BETA HCG BLOOD, ED ([facility], [facility], [facility] ONLY)    EKG None  Radiology No results found.  Procedures Procedures (including critical care time)  Medications Ordered in ED Medications  sodium chloride flush (NS) 0.9 % injection 3 mL (has no administration in time range)  acetaminophen (TYLENOL) tablet 650 mg (650 mg Oral Given 01/28/19 [id number])     Initial Impression / Assessment and Plan / ED Course  I have reviewed the triage vital signs and the nursing notes.  Pertinent labs & imaging results that were available during my care of the patient were reviewed by me and considered in my medical decision making (see chart for details).        48 year old female with history  of bipolar disorder, peripheral neuropathy, alcohol abuse, drug abuse, and seizures presenting with bilateral lower extremity edema for the last 2 weeks.  No shortness of breath, chest pain, fever, or chills.  The patient was seen and independently evaluated by Dr. Roxanne Mins, attending physician.  Low suspicion for acute heart failure, cellulitis.  She was advised to wear compression stockings and follow a low-salt diet and elevate her extremities.  The patient's legal guardian was updated with findings and care plan prior to discharge.  The patient is hemodynamically stable in no acute distress.  Safe discharge home with outpatient follow-up as needed.   Final Clinical Impressions(s) / ED Diagnoses   Final diagnoses:  Peripheral edema    ED Discharge Orders    None       Joanne Gavel, PA-C [id number]    Delora Fuel, MD [id number]

## 2019-01-28 NOTE — ED Notes (Signed)
Patient isn't present in waiting room.  Pt not answering for reassessment of vital signs.

## 2019-01-28 NOTE — ED Notes (Signed)
[place] call when pt is getting discharged/ updates

## 2019-01-31 ENCOUNTER — Encounter (HOSPITAL_COMMUNITY): Payer: Self-pay

## 2019-01-31 ENCOUNTER — Other Ambulatory Visit: Payer: Self-pay

## 2019-01-31 ENCOUNTER — Emergency Department (HOSPITAL_COMMUNITY)
Admission: EM | Admit: 2019-01-31 | Discharge: 2019-01-31 | Disposition: A | Discharge status: Other Acute Inpatient Hospital | Payer: Medicare HMO | Attending: Emergency Medicine | Admitting: Emergency Medicine

## 2019-01-31 ENCOUNTER — Inpatient Hospital Stay (HOSPITAL_COMMUNITY)
Admission: AD | Admit: 2019-01-31 | Discharge: 2019-02-04 | DRG: 885 | Disposition: A | Discharge status: Home / Self Care | Payer: Medicare HMO | Source: Intra-hospital | Attending: Psychiatry | Admitting: Psychiatry

## 2019-01-31 DIAGNOSIS — F314 Bipolar disorder, current episode depressed, severe, without psychotic features: Secondary | ICD-10-CM | POA: Diagnosis not present

## 2019-01-31 DIAGNOSIS — F312 Bipolar disorder, current episode manic severe with psychotic features: Secondary | ICD-10-CM | POA: Diagnosis not present

## 2019-01-31 DIAGNOSIS — R45851 Suicidal ideations: Secondary | ICD-10-CM | POA: Diagnosis present

## 2019-01-31 DIAGNOSIS — K5909 Other constipation: Secondary | ICD-10-CM | POA: Diagnosis present

## 2019-01-31 DIAGNOSIS — Z20828 Contact with and (suspected) exposure to other viral communicable diseases: Secondary | ICD-10-CM | POA: Diagnosis not present

## 2019-01-31 DIAGNOSIS — R451 Restlessness and agitation: Secondary | ICD-10-CM | POA: Diagnosis present

## 2019-01-31 DIAGNOSIS — F259 Schizoaffective disorder, unspecified: Secondary | ICD-10-CM | POA: Diagnosis not present

## 2019-01-31 DIAGNOSIS — Z79899 Other long term (current) drug therapy: Secondary | ICD-10-CM | POA: Insufficient documentation

## 2019-01-31 DIAGNOSIS — F419 Anxiety disorder, unspecified: Secondary | ICD-10-CM | POA: Diagnosis present

## 2019-01-31 DIAGNOSIS — Z03818 Encounter for observation for suspected exposure to other biological agents ruled out: Secondary | ICD-10-CM | POA: Diagnosis not present

## 2019-01-31 DIAGNOSIS — F3189 Other bipolar disorder: Secondary | ICD-10-CM

## 2019-01-31 LAB — COMPREHENSIVE METABOLIC PANEL
ALT: 35 U/L (ref 0–44)
AST: 47 U/L — ABNORMAL HIGH (ref 15–41)
Albumin: 4.2 g/dL (ref 3.5–5.0)
Alkaline Phosphatase: 97 U/L (ref 38–126)
Anion gap: 12 (ref 5–15)
BUN: 15 mg/dL (ref 6–20)
CO2: 23 mmol/L (ref 22–32)
Calcium: 9.2 mg/dL (ref 8.9–10.3)
Chloride: 101 mmol/L (ref 98–111)
Creatinine, Ser: 0.93 mg/dL (ref 0.44–1.00)
GFR calc Af Amer: >60 mL/min (ref >60)
GFR calc non Af Amer: >60 mL/min (ref >60)
Glucose, Bld: 100 mg/dL — ABNORMAL HIGH (ref 70–99)
Potassium: 3.3 mmol/L — ABNORMAL LOW (ref 3.5–5.1)
Sodium: 136 mmol/L (ref 135–145)
Total Bilirubin: 1.1 mg/dL (ref 0.3–1.2)
Total Protein: 8.6 g/dL — ABNORMAL HIGH (ref 6.5–8.1)

## 2019-01-31 LAB — RAPID URINE DRUG SCREEN, HOSP PERFORMED
Amphetamines: NOT DETECTED
Barbiturates: NOT DETECTED
Benzodiazepines: NOT DETECTED
Cocaine: NOT DETECTED
Opiates: NOT DETECTED
Tetrahydrocannabinol: NOT DETECTED

## 2019-01-31 LAB — ETHANOL: Alcohol, Ethyl (B): <10 mg/dL (ref <10)

## 2019-01-31 LAB — CBC
HCT: 44.1 % (ref 36.0–46.0)
Hemoglobin: 13.5 g/dL (ref 12.0–15.0)
MCH: 21.2 pg — ABNORMAL LOW (ref 26.0–34.0)
MCHC: 30.6 g/dL (ref 30.0–36.0)
MCV: 69.1 fL — ABNORMAL LOW (ref 80.0–100.0)
Platelets: 208 10*3/uL (ref 150–400)
RBC: 6.38 MIL/uL — ABNORMAL HIGH (ref 3.87–5.11)
RDW: 20 % — ABNORMAL HIGH (ref 11.5–15.5)
WBC: 7.6 10*3/uL (ref 4.0–10.5)
nRBC: 0 % (ref 0.0–0.2)

## 2019-01-31 LAB — SARS CORONAVIRUS 2 BY RT PCR (HOSPITAL ORDER, PERFORMED IN ~~LOC~~ HOSPITAL LAB): SARS Coronavirus 2: NEGATIVE

## 2019-01-31 LAB — I-STAT BETA HCG BLOOD, ED (MC, WL, AP ONLY): I-stat hCG, quantitative: <5 m[IU]/mL (ref <5)

## 2019-01-31 MED ORDER — TRAZODONE HCL 50 MG PO TABS: 50.0000 mg | ORAL_TABLET | Freq: Every evening | ORAL | Status: DC | PRN

## 2019-01-31 MED ORDER — HYDROXYZINE HCL 50 MG PO TABS
50.0000 mg | ORAL_TABLET | Freq: Once | ORAL | Status: AC
  Administered 2019-01-31: 50 mg via ORAL
  Filled 2019-01-31 (×2): qty 1

## 2019-01-31 MED ORDER — POTASSIUM CHLORIDE CRYS ER 20 MEQ PO TBCR
40.0000 meq | EXTENDED_RELEASE_TABLET | Freq: Once | ORAL | Status: AC
  Administered 2019-01-31: 40 meq via ORAL
  Filled 2019-01-31: qty 2

## 2019-01-31 MED ORDER — HYDROXYZINE HCL 25 MG PO TABS
25.0000 mg | ORAL_TABLET | Freq: Three times a day (TID) | ORAL | Status: DC | PRN
  Administered 2019-02-03: 25 mg via ORAL
  Filled 2019-01-31 (×2): qty 1

## 2019-01-31 MED ORDER — ALUM & MAG HYDROXIDE-SIMETH 200-200-20 MG/5ML PO SUSP: 30.0000 mL | ORAL | Status: DC | PRN

## 2019-01-31 MED ORDER — CLARITHROMYCIN 500 MG PO TABS
500.0000 mg | ORAL_TABLET | Freq: Two times a day (BID) | ORAL | Status: DC
  Administered 2019-02-01 – 2019-02-04 (×7): 500 mg via ORAL
  Filled 2019-01-31 (×9): qty 1

## 2019-01-31 MED ORDER — PANTOPRAZOLE SODIUM 40 MG PO TBEC
40.0000 mg | DELAYED_RELEASE_TABLET | Freq: Every day | ORAL | Status: DC
  Administered 2019-02-01 – 2019-02-04 (×4): 40 mg via ORAL
  Filled 2019-01-31 (×6): qty 1

## 2019-01-31 MED ORDER — POLYETHYLENE GLYCOL 3350 17 G PO PACK
17.0000 g | PACK | Freq: Every day | ORAL | Status: DC
  Administered 2019-02-01 – 2019-02-04 (×4): 17 g via ORAL
  Filled 2019-01-31 (×5): qty 1

## 2019-01-31 MED ORDER — AMOXICILLIN 500 MG PO CAPS
500.0000 mg | ORAL_CAPSULE | Freq: Two times a day (BID) | ORAL | Status: DC
  Administered 2019-02-01 – 2019-02-04 (×7): 500 mg via ORAL
  Filled 2019-01-31 (×9): qty 1
  Filled 2019-01-31: qty 2

## 2019-01-31 MED ORDER — HYDROCHLOROTHIAZIDE 12.5 MG PO CAPS
12.5000 mg | ORAL_CAPSULE | Freq: Every day | ORAL | Status: DC
  Administered 2019-02-01 – 2019-02-04 (×4): 12.5 mg via ORAL
  Filled 2019-01-31 (×6): qty 1

## 2019-01-31 MED ORDER — BENZTROPINE MESYLATE 0.5 MG PO TABS
0.5000 mg | ORAL_TABLET | Freq: Every day | ORAL | Status: DC
  Administered 2019-01-31 – 2019-02-01 (×2): 0.5 mg via ORAL
  Filled 2019-01-31 (×4): qty 1

## 2019-01-31 MED ORDER — MAGNESIUM HYDROXIDE 400 MG/5ML PO SUSP: 30.0000 mL | Freq: Every day | ORAL | Status: DC | PRN

## 2019-01-31 MED ORDER — ACETAMINOPHEN 325 MG PO TABS
650.0000 mg | ORAL_TABLET | Freq: Four times a day (QID) | ORAL | Status: DC | PRN
  Administered 2019-02-03: 650 mg via ORAL
  Filled 2019-01-31: qty 2

## 2019-01-31 NOTE — ED Notes (Signed)
This Probation officer attempt twice to collect labs. Both times unable to collect enough blood for testing

## 2019-01-31 NOTE — ED Notes (Signed)
Pt ambulatory to restroom
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
## 2019-01-31 NOTE — ED Notes (Signed)
Pt asked for scrubs to change into, as well as a shower and to brush teeth. Pt given scrubs, told she can shower later.

## 2019-01-31 NOTE — ED Notes (Signed)
Patient called out stating she needed to see a nurse. Went to bedside, patient stated she has had burning to the bottom of her feet for the last 3-4 months.

## 2019-01-31 NOTE — ED Notes (Signed)
Lofall mad aware that we are now awaiting Covid test results. Will call back when results are posted.

## 2019-01-31 NOTE — ED Notes (Signed)
Patient has took another shower and been provided maxi pads. Patient has her dinner meal tray.
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
## 2019-01-31 NOTE — ED Notes (Signed)
Pt has been accepted at [place] and can arrive at 2100. Pt *MUST* have a COVID test and the results returned prior to coming to [facility]. This information was provided to pt's nurse, [place], at [phone].   Room: 305-2  Accepting: Dr. Mariea Clonts  Attending: Dr. Mallie Darting  Call to Report: [phone]

## 2019-01-31 NOTE — Progress Notes (Signed)
ED TO INPATIENT HANDOFF REPORT  Name/Age/Gender Melissa Dougherty 48 y.o. female  Code Status    Code Status Orders  (From admission, onward)         Start     Ordered   01/31/19 1126  Full code  Continuous     01/31/19 1125        Code Status History    Date Active Date Inactive Code Status Order ID Comments User Context   07/22/2015 1628 07/23/2015 2111 Full Code [id number]  Janee Morn ED   01/08/2014 1825 01/09/2014 1645 Full Code [id number]  Glendell Docker, NP ED   12/23/2013 2306 12/28/2013 1652 Full Code [id number]  Malena Peer, NP Inpatient   12/22/2013 1724 12/23/2013 1703 Full Code [id number]  Malena Peer, NP Inpatient   Advance Care Planning Activity      Home/SNF/Other Home  Chief Complaint medical clearance  Level of Care/Admitting Diagnosis ED Disposition    None      Medical History Past Medical History:  Diagnosis Date  . Alcohol abuse   . Allergy   . Bipolar disorder ([place])   . Depression   . Drug abuse ([place])   . GERD (gastroesophageal reflux disease)   . Heart murmur   . Seizures ([place])    Last seizure was in 1989    Allergies No Known Allergies  IV Location/Drains/Wounds Patient Lines/Drains/Airways Status   Active Line/Drains/Airways    None          Labs/Imaging Results for orders placed or performed during the hospital encounter of 01/31/19 (from the past 48 hour(s))  Rapid urine drug screen (hospital performed)     Status: None   Collection Time: 01/31/19 10:21 AM  Result Value Ref Range   Opiates NONE DETECTED NONE DETECTED   Cocaine NONE DETECTED NONE DETECTED   Benzodiazepines NONE DETECTED NONE DETECTED   Amphetamines NONE DETECTED NONE DETECTED   Tetrahydrocannabinol NONE DETECTED NONE DETECTED   Barbiturates NONE DETECTED NONE DETECTED    Comment: (NOTE) DRUG SCREEN FOR MEDICAL PURPOSES ONLY.  IF CONFIRMATION IS NEEDED FOR ANY PURPOSE, NOTIFY LAB WITHIN 5 DAYS. LOWEST DETECTABLE  LIMITS FOR URINE DRUG SCREEN Drug Class                     Cutoff (ng/mL) Amphetamine and metabolites    1000 Barbiturate and metabolites    200 Benzodiazepine                 [id number] Tricyclics and metabolites     300 Opiates and metabolites        300 Cocaine and metabolites        300 THC                            50 Performed at [facility], [place] [address], [place], [place] [address]   CBC     Status: Abnormal   Collection Time: 01/31/19 11:52 AM  Result Value Ref Range   WBC 7.6 4.0 - 10.5 K/uL   RBC 6.38 (H) 3.87 - 5.11 MIL/uL   Hemoglobin 13.5 12.0 - 15.0 g/dL   HCT 44.1 36.0 - 46.0 %   MCV 69.1 (L) 80.0 - 100.0 fL   MCH 21.2 (L) 26.0 - 34.0 pg   MCHC 30.6 30.0 - 36.0 g/dL   RDW 20.0 (H) 11.5 - 15.5 %   Platelets 208 150 - 400 K/uL  nRBC 0.0 0.0 - 0.2 %    Comment: Performed at [facility], [place] [address], [place], [place] [address]  Comprehensive metabolic panel     Status: Abnormal   Collection Time: 01/31/19 11:52 AM  Result Value Ref Range   Sodium 136 135 - 145 mmol/L   Potassium 3.3 (L) 3.5 - 5.1 mmol/L   Chloride 101 98 - 111 mmol/L   CO2 23 22 - 32 mmol/L   Glucose, Bld 100 (H) 70 - 99 mg/dL   BUN 15 6 - 20 mg/dL   Creatinine, Ser 0.93 0.44 - 1.00 mg/dL   Calcium 9.2 8.9 - 10.3 mg/dL   Total Protein 8.6 (H) 6.5 - 8.1 g/dL   Albumin 4.2 3.5 - 5.0 g/dL   AST 47 (H) 15 - 41 U/L   ALT 35 0 - 44 U/L   Alkaline Phosphatase 97 38 - 126 U/L   Total Bilirubin 1.1 0.3 - 1.2 mg/dL   GFR calc non Af Amer >60 >60 mL/min   GFR calc Af Amer >60 >60 mL/min   Anion gap 12 5 - 15    Comment: Performed at [facility], [place] [address] [place], [place] [address]  Ethanol     Status: None   Collection Time: 01/31/19 11:52 AM  Result Value Ref Range   Alcohol, Ethyl (B) <10 <10 mg/dL    Comment: (NOTE) Lowest detectable limit for serum alcohol is 10 mg/dL. For medical purposes only. Performed at [facility], [place] [address], [place], [place] [address]   I-Stat beta hCG blood, ED     Status: None   Collection Time: 01/31/19 11:56 AM  Result Value Ref Range   I-stat hCG, quantitative <5.0 <5 mIU/mL   Comment 3            Comment:   GEST. AGE      CONC.  (mIU/mL)   <=1 WEEK        5 - 50     2 WEEKS       50 - 500     3 WEEKS       100 - 10,000     4 WEEKS     1,000 - 30,000        FEMALE AND NON-PREGNANT FEMALE:     LESS THAN 5 mIU/mL   SARS Coronavirus 2 [facility] order, Performed in [facility] hospital lab) Nasopharyngeal Nasopharyngeal Swab     Status: None   Collection Time: 01/31/19  8:00 PM   Specimen: Nasopharyngeal Swab  Result Value Ref Range   SARS Coronavirus 2 NEGATIVE NEGATIVE    Comment: (NOTE) If result is NEGATIVE SARS-CoV-2 target nucleic acids are NOT DETECTED. The SARS-CoV-2 RNA is generally detectable in upper and lower  respiratory specimens during the acute phase of infection. The lowest  concentration of SARS-CoV-2 viral copies this assay can detect is 250  copies / mL. A negative result does not preclude SARS-CoV-2 infection  and should not be used as the sole basis for treatment or other  patient management decisions.  A negative result may occur with  improper specimen collection / handling, submission of specimen other  than nasopharyngeal swab, presence of viral mutation(s) within the  areas targeted by this assay, and inadequate number of viral copies  (<250 copies / mL). A negative result must be combined with clinical  observations, patient history, and epidemiological information. If result is POSITIVE SARS-CoV-2 target nucleic acids  are DETECTED. The SARS-CoV-2 RNA is generally detectable in upper and lower  respiratory specimens dur ing the acute phase of infection.  Positive  results are indicative of active infection with SARS-CoV-2.  Clinical  correlation with patient history and other diagnostic information is  necessary to  determine patient infection status.  Positive results do  not rule out bacterial infection or co-infection with other viruses. If result is PRESUMPTIVE POSTIVE SARS-CoV-2 nucleic acids MAY BE PRESENT.   A presumptive positive result was obtained on the submitted specimen  and confirmed on repeat testing.  While 2019 novel coronavirus  (SARS-CoV-2) nucleic acids may be present in the submitted sample  additional confirmatory testing may be necessary for epidemiological  and / or clinical management purposes  to differentiate between  SARS-CoV-2 and other Sarbecovirus currently known to infect humans.  If clinically indicated additional testing with an alternate test  methodology [phone]) is advised. The SARS-CoV-2 RNA is generally  detectable in upper and lower respiratory sp ecimens during the acute  phase of infection. The expected result is Negative. Fact Sheet for Patients:  StrictlyIdeas.no Fact Sheet for Healthcare Providers: BankingDealers.co.za This test is not yet approved or cleared by the [place] FDA and has been authorized for detection and/or diagnosis of SARS-CoV-2 by FDA under an Emergency Use Authorization (EUA).  This EUA will remain in effect (meaning this test can be used) for the duration of the COVID-19 declaration under Section 564(b)(1) of the Act, 21 U.S.C. section 360bbb-3(b)(1), unless the authorization is terminated or revoked sooner. Performed at [facility], [place] [address] [place], [place] [address]    No results found.  Pending Labs Unresulted Labs (From admission, onward)   None      Vitals/Pain Today's Vitals   01/31/19 0950 01/31/19 0952 01/31/19 1826 01/31/19 2119  BP:  (!) 145/92 (!) 134/95 121/81  Pulse: (!) 102  100 98  Resp: 16  18 18   Temp: 98.1 F (36.7 C)  97.8 F (36.6 C) 97.9 F (36.6 C)  TempSrc:   Oral Oral  SpO2: 97%  100% 100%  Weight:  80 kg     PainSc:  0-No pain      Isolation Precautions Airborne and Contact precautions  Medications Medications  potassium chloride SA (K-DUR) CR tablet 40 mEq (40 mEq Oral Given 01/31/19 1311)    Mobility walks
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
## 2019-01-31 NOTE — BH Assessment (Signed)
[facility] Assessment Progress Note   Case was staffed with Norman DO who recommended a inpatient admission to assist with stabilization.    

## 2019-01-31 NOTE — ED Notes (Signed)
Reggie, friend, [phone]. He reported to [facility], pharmacy tech that he could provide information about patient and he recommended she not leave without treatment.

## 2019-01-31 NOTE — ED Notes (Signed)
Provided patient 2 warm blankets. Explained to patient that she can these off and on of her depending on her comfort level, but we could not continually keep giving blankets for her to place on the floor.

## 2019-02-01 ENCOUNTER — Encounter (HOSPITAL_COMMUNITY): Payer: Self-pay

## 2019-02-01 DIAGNOSIS — F314 Bipolar disorder, current episode depressed, severe, without psychotic features: Principal | ICD-10-CM

## 2019-02-01 MED ORDER — TEMAZEPAM 15 MG PO CAPS
15.0000 mg | ORAL_CAPSULE | Freq: Every evening | ORAL | Status: DC | PRN
  Administered 2019-02-01 – 2019-02-03 (×3): 15 mg via ORAL
  Filled 2019-02-01 (×3): qty 1

## 2019-02-01 MED ORDER — DIVALPROEX SODIUM 125 MG PO CSDR
125.0000 mg | DELAYED_RELEASE_CAPSULE | Freq: Two times a day (BID) | ORAL | Status: DC
  Administered 2019-02-01 – 2019-02-04 (×7): 125 mg via ORAL
  Filled 2019-02-01 (×9): qty 1

## 2019-02-01 MED ORDER — FLUOXETINE HCL 20 MG PO CAPS
20.0000 mg | ORAL_CAPSULE | Freq: Every day | ORAL | Status: DC
  Administered 2019-02-01 – 2019-02-04 (×4): 20 mg via ORAL
  Filled 2019-02-01 (×5): qty 1

## 2019-02-01 MED ORDER — TEMAZEPAM 30 MG PO CAPS
30.0000 mg | ORAL_CAPSULE | Freq: Every evening | ORAL | Status: DC | PRN
  Administered 2019-02-01: 30 mg via ORAL
  Filled 2019-02-01: qty 1

## 2019-02-01 NOTE — Tx Team (Signed)
Initial Treatment Plan 02/01/2019 1:57 AM Melissa Dougherty [id number]    PATIENT STRESSORS: Marital or family conflict Medication change or noncompliance   PATIENT STRENGTHS: General fund of knowledge Motivation for treatment/growth   PATIENT IDENTIFIED PROBLEMS:  risk for suicide  depression  psychosis  " self esteem, un-conditional love"               DISCHARGE CRITERIA:  Adequate post-discharge living arrangements Improved stabilization in mood, thinking, and/or behavior Verbal commitment to aftercare and medication compliance  PRELIMINARY DISCHARGE PLAN: Attend aftercare/continuing care group Attend PHP/IOP Outpatient therapy  PATIENT/FAMILY INVOLVEMENT: This treatment plan has been presented to and reviewed with the patient, Melissa Dougherty.  The patient and family have been given the opportunity to ask questions and make suggestions.  Providence Crosby, RN 02/01/2019, 1:57 AM
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
## 2019-02-01 NOTE — Progress Notes (Signed)
Patient ID: Melissa Dougherty, female   DOB: 08/27/1970, 48 y.o.   MRN: [id number] D: Patient at nursing station talking to staff on approach. Pt reports she had a good day and has been working on Radiographer, therapeutic. Pt showed Probation officer several coping skills she has written down. Pt had to be directed multiple times to use walker for safety.   Denies  SI/HI/AVH.No behavioral issues noted.  A: Support and encouragement offered as needed to express needs. Medications administered as prescribed.  R: Patient is safe and cooperative on unit. Will continue to monitor  for safety and stability.
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
## 2019-02-01 NOTE — Progress Notes (Signed)
Adult Psychoeducational Group Note  Date:  02/01/2019 Time:  10:19 PM  Group Topic/Focus:  Wrap-Up Group:   The focus of this group is to help patients review their daily goal of treatment and discuss progress on daily workbooks.  Participation Level:  Active  Participation Quality:  Appropriate  Affect:  Appropriate  Cognitive:  Appropriate  Insight: Appropriate  Engagement in Group:  Developing/Improving  Modes of Intervention:  Discussion  Additional Comments:  Pt stated he goal for today was to focus on her treatment. Pt stated she felt she accomplished her goal today. Pt stated her relationship with her family has improved since she was admitted here. Pt stated she felt better about herself today. Pt rated her overall day a 10. Pt stated her appetite was pretty good today. Pt stated her goal for tonight is to get another good nights rest. Pt stated she had pain in her feet tonight. Pt nurse was made aware of the situation. Pt stated if any thing change she would alert staff.  Candy Sledge 02/01/2019, 10:19 PM

## 2019-02-01 NOTE — Progress Notes (Signed)
Recreation Therapy Notes  Date: 9.8.20 Time: 0945 Location: 500 Hall Dayroom  Group Topic:  Goal Setting  Goal Area(s) Addresses:  Patient will be able to identify at least 3 life goals.  Patient will be able to identify benefit of investing in life goals.  Patient will be able to identify benefit of setting life goals.   Behavioral Response:  Engaged  Intervention:  Worksheet  Activity: Lobbyist.  Patients were to identify goals they want to accomplish in the next week, month, year and 5 years.  Patients were to also identify any obstacles they may face, what they need to achieve goals and what they can start doing now to work towards goals.  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  Pt identified obstacles as "people distracting me"; needs school supplies and a computer and can start working towards goals by doing assignments.    [name], LRT/CTRS    Ria Comment, Kaylah Chiasson A 02/01/2019 11:02 AM

## 2019-02-01 NOTE — Progress Notes (Signed)
Recreation Therapy Notes  INPATIENT RECREATION THERAPY ASSESSMENT  Patient Details Name: Melissa Dougherty MRN: [id number] DOB: 04-Jul-1970 Today's Date: 02/01/2019       Information Obtained From: Patient  Able to Participate in Assessment/Interview: Yes  Patient Presentation: Alert  Reason for Admission (Per Patient): Other (Comments)(Pt stated she had a nervous breakdown)  Patient Stressors: Family, Other (Comment)(Biological mother)  Coping Skills:   Journal, TV, Sports, Arguments, Aggression, Music, Substance Abuse, Impulsivity, Talk, Prayer, Avoidance, Read  Leisure Interests (2+):  Games - Risk analyst, Social - Friends  Frequency of Recreation/Participation: Engineer, production of Community Resources:  Yes  Community Resources:  Library  Current Use: No  If no, Barriers?: Other (Comment)(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)  Expressed Interest in [place]: No  [employer] of Residence:  Investment banker, corporate  Patient Main Form of Transportation: Musician  Patient Strengths:  Estate manager/land agent  Patient Identified Areas of Improvement:  Not get so angry; Communicate  Patient Goal for Hospitalization:  "lean to love self, learn coping skills and be independent"  Current SI (including self-harm):  No  Current HI:  No  Current AVH: No  Staff Intervention Plan: Group Attendance, Collaborate with Interdisciplinary Treatment Team  Consent to Intern Participation: N/A    [name], LRT/CTRS  Ria Comment, [employer] A 02/01/2019, 11:54 AM

## 2019-02-01 NOTE — Progress Notes (Signed)
Pt pushed the STARR button and when staff came down to the room pt was soaking wet with her scrubs on and water on the floor in her room and water on the floor in the bathroom. Pt stated she took a shower, but pt did not have any towels to dry herself off with. Pt stated she did not need towels. Pt was moved from 300 hall to 500 hall to a closer room to the nursing station and less stimulation on the hallway.

## 2019-02-02 MED ORDER — PERPHENAZINE 2 MG PO TABS
2.0000 mg | ORAL_TABLET | Freq: Every day | ORAL | Status: DC
  Administered 2019-02-02 – 2019-02-03 (×2): 2 mg via ORAL
  Filled 2019-02-02 (×3): qty 1

## 2019-02-02 NOTE — Progress Notes (Signed)
Recreation Therapy Notes  Date: 9.9.20 Time: 0945 Location: 500 Hall Dayroom  Group Topic: Coping Skills  Goal Area(s) Addresses:  Patient will identify positive coping skills for various situations. Patient will identify benefit of using coping skills post d/c.  Behavioral Response:  Engaged  Intervention: Worksheet, magazines, scissors, glue sticks  Activity: Museum/gallery conservator.  Patients were to identify at least 2 coping skills for the categories (diversion, social, cognitive, tension releaser and physical) presented.  Education: Radiographer, therapeutic, Dentist.   Education Outcome: Acknowledges understanding/In group clarification offered/Needs additional education.   Clinical Observations/Feedback:  Pt was able to identify coping skills for diversions- friends, biological mom; social- therapist, Merlyn Albert; Cognitive- working on G.E.D, working on driver's test; tension releasers- bath, walk and get nails done; and physical- exercise, drink water and stay healthy.    [name], LRT/CTRS   [name] A 02/02/2019 10:23 AM

## 2019-02-02 NOTE — [facility] Suicide Risk Assessment (Addendum)
[place] INPATIENT:  Family/Significant Other Suicide Prevention Education  Suicide Prevention Education:  Contact Attempts: Pt's friend, Melissa Dougherty,  has been identified by the patient as the family member/significant other with whom the patient will be residing, and identified as the person(s) who will aid the patient in the event of a mental health crisis.  With written consent from the patient, two attempts were made to provide suicide prevention education, prior to and/or following the patient's discharge.  We were unsuccessful in providing suicide prevention education.  A suicide education pamphlet was given to the patient to share with family/significant other.  Date and time of first attempt: 02/02/2019 @ 3:55pm Date and time of second attempt: 02/03/2019 @ 9:30am  Melissa Dougherty 02/02/2019, 3:57 PM

## 2019-02-02 NOTE — Progress Notes (Signed)
[facility] MD Progress Note  02/02/2019 7:51 AM Melissa Dougherty  MRN:  [id number] Subjective:    Patient had an uneventful evening she states that her relationship with her family is good but she lives on her own in her own apartment on [address].  She was heard on the phone however being somewhat argumentative and saying family was not supportive of her, assuming she was talking to family members.  She states she is not had seizure medications not needed them in over 5 years so we will accept this determination she states she was told she did not need them.    She does participate well she believes the medications are helpful denies auditory and visual hallucinations and thoughts of harming self are not reported today. Principal Problem:  Diagnosis: Active Problems:   Bipolar 1 disorder, depressed, severe ([place])  Total Time spent with patient: 20 minutes  Past Psychiatric History: As discussed her past history is somewhat ill-defined  Past Medical History:  Past Medical History:  Diagnosis Date  . Alcohol abuse   . Allergy   . Bipolar disorder ([place])   . Depression   . Drug abuse ([place])   . GERD (gastroesophageal reflux disease)   . Heart murmur   . Seizures ([place])    Last seizure was in 1989    Past Surgical History:  Procedure Laterality Date  . BACK SURGERY    . TUBAL LIGATION     Family History:  Family History  Problem Relation Age of Onset  . Diabetes Mother   . Hypertension Mother   . Alcohol abuse Father    Family Psychiatric  History: neg Social History:  Social History   Substance and Sexual Activity  Alcohol Use No     Social History   Substance and Sexual Activity  Drug Use No    Social History   Socioeconomic History  . Marital status: Single    Spouse name: Not on file  . Number of children: 2  . Years of education: 60  . Highest education level: Not on file  Occupational History  . Occupation: Disability    Comment: Epilepsy, Mental Health  Issues  Social Needs  . Financial resource strain: Not on file  . Food insecurity    Worry: Not on file    Inability: Not on file  . Transportation needs    Medical: Not on file    Non-medical: Not on file  Tobacco Use  . Smoking status: Never Smoker  . Smokeless tobacco: Never Used  Substance and Sexual Activity  . Alcohol use: No  . Drug use: No  . Sexual activity: Not Currently    Birth control/protection: Surgical  Lifestyle  . Physical activity    Days per week: Not on file    Minutes per session: Not on file  . Stress: Not on file  Relationships  . Social Herbalist on phone: Not on file    Gets together: Not on file    Attends religious service: Not on file    Active member of club or organization: Not on file    Attends meetings of clubs or organizations: Not on file    Relationship status: Not on file  Other Topics Concern  . Not on file  Social History Narrative   Fun: Go shopping   Denies abuse and feels safe at home.    Additional Social History:  Sleep: Good  Appetite:  Good  Current Medications: Current Facility-Administered Medications  Medication Dose Route Frequency Provider Last Rate Last Dose  . acetaminophen (TYLENOL) tablet 650 mg  650 mg Oral Q6H PRN Lindon Romp A, NP      . alum & mag hydroxide-simeth (MAALOX/MYLANTA) 200-200-20 MG/5ML suspension 30 mL  30 mL Oral Q4H PRN Lindon Romp A, NP      . amoxicillin (AMOXIL) capsule 500 mg  500 mg Oral BID Lindon Romp A, NP   500 mg at 02/01/19 1708  . clarithromycin (BIAXIN) tablet 500 mg  500 mg Oral BID Lindon Romp A, NP   500 mg at 02/01/19 1708  . divalproex (DEPAKOTE SPRINKLE) capsule 125 mg  125 mg Oral Q12H Johnn Hai, MD   125 mg at 02/01/19 1944  . FLUoxetine (PROZAC) capsule 20 mg  20 mg Oral Daily Johnn Hai, MD   20 mg at 02/01/19 1148  . hydrochlorothiazide (MICROZIDE) capsule 12.5 mg  12.5 mg Oral Daily Lindon Romp A, NP   12.5 mg at  02/01/19 [id number]  . hydrOXYzine (ATARAX/VISTARIL) tablet 25 mg  25 mg Oral TID PRN Lindon Romp A, NP      . magnesium hydroxide (MILK OF MAGNESIA) suspension 30 mL  30 mL Oral Daily PRN Lindon Romp A, NP      . pantoprazole (PROTONIX) EC tablet 40 mg  40 mg Oral Daily Lindon Romp A, NP   40 mg at 02/01/19 0723  . polyethylene glycol (MIRALAX / GLYCOLAX) packet 17 g  17 g Oral Daily Lindon Romp A, NP   17 g at 02/01/19 0740  . temazepam (RESTORIL) capsule 15 mg  15 mg Oral QHS PRN Johnn Hai, MD   15 mg at 02/01/19 2111    Lab Results:  Results for orders placed or performed during the hospital encounter of 01/31/19 (from the past 48 hour(s))  Rapid urine drug screen (hospital performed)     Status: None   Collection Time: 01/31/19 10:21 AM  Result Value Ref Range   Opiates NONE DETECTED NONE DETECTED   Cocaine NONE DETECTED NONE DETECTED   Benzodiazepines NONE DETECTED NONE DETECTED   Amphetamines NONE DETECTED NONE DETECTED   Tetrahydrocannabinol NONE DETECTED NONE DETECTED   Barbiturates NONE DETECTED NONE DETECTED    Comment: (NOTE) DRUG SCREEN FOR MEDICAL PURPOSES ONLY.  IF CONFIRMATION IS NEEDED FOR ANY PURPOSE, NOTIFY LAB WITHIN 5 DAYS. LOWEST DETECTABLE LIMITS FOR URINE DRUG SCREEN Drug Class                     Cutoff (ng/mL) Amphetamine and metabolites    1000 Barbiturate and metabolites    200 Benzodiazepine                 [id number] Tricyclics and metabolites     300 Opiates and metabolites        300 Cocaine and metabolites        300 THC                            50 Performed at [facility], [place] [address], [place], [place] [address]   CBC     Status: Abnormal   Collection Time: 01/31/19 11:52 AM  Result Value Ref Range   WBC 7.6 4.0 - 10.5 K/uL   RBC 6.38 (H) 3.87 - 5.11 MIL/uL   Hemoglobin 13.5 12.0 - 15.0 g/dL   HCT  44.1 36.0 - 46.0 %   MCV 69.1 (L) 80.0 - 100.0 fL   MCH 21.2 (L) 26.0 - 34.0 pg   MCHC 30.6 30.0 - 36.0 g/dL   RDW 20.0  (H) 11.5 - 15.5 %   Platelets 208 150 - 400 K/uL   nRBC 0.0 0.0 - 0.2 %    Comment: Performed at [facility], [place] [address], [place], [place] [address]  Comprehensive metabolic panel     Status: Abnormal   Collection Time: 01/31/19 11:52 AM  Result Value Ref Range   Sodium 136 135 - 145 mmol/L   Potassium 3.3 (L) 3.5 - 5.1 mmol/L   Chloride 101 98 - 111 mmol/L   CO2 23 22 - 32 mmol/L   Glucose, Bld 100 (H) 70 - 99 mg/dL   BUN 15 6 - 20 mg/dL   Creatinine, Ser 0.93 0.44 - 1.00 mg/dL   Calcium 9.2 8.9 - 10.3 mg/dL   Total Protein 8.6 (H) 6.5 - 8.1 g/dL   Albumin 4.2 3.5 - 5.0 g/dL   AST 47 (H) 15 - 41 U/L   ALT 35 0 - 44 U/L   Alkaline Phosphatase 97 38 - 126 U/L   Total Bilirubin 1.1 0.3 - 1.2 mg/dL   GFR calc non Af Amer >60 >60 mL/min   GFR calc Af Amer >60 >60 mL/min   Anion gap 12 5 - 15    Comment: Performed at [facility], [place] [address] [place], [place] [address]  Ethanol     Status: None   Collection Time: 01/31/19 11:52 AM  Result Value Ref Range   Alcohol, Ethyl (B) <10 <10 mg/dL    Comment: (NOTE) Lowest detectable limit for serum alcohol is 10 mg/dL. For medical purposes only. Performed at [facility], [place] [address] [place],  [address]   I-Stat beta hCG blood, ED     Status: None   Collection Time: 01/31/19 11:56 AM  Result Value Ref Range   I-stat hCG, quantitative <5.0 <5 mIU/mL   Comment 3            Comment:   GEST. AGE      CONC.  (mIU/mL)   <=1 WEEK        5 - 50     2 WEEKS       50 - 500     3 WEEKS       100 - 10,000     4 WEEKS     1,000 - 30,000        FEMALE AND NON-PREGNANT FEMALE:     LESS THAN 5 mIU/mL   SARS Coronavirus 2 [facility] order, Performed in [facility] hospital lab) Nasopharyngeal Nasopharyngeal Swab     Status: None   Collection Time: 01/31/19  8:00 PM   Specimen: Nasopharyngeal Swab  Result Value Ref Range   SARS Coronavirus 2 NEGATIVE NEGATIVE    Comment:  (NOTE) If result is NEGATIVE SARS-CoV-2 target nucleic acids are NOT DETECTED. The SARS-CoV-2 RNA is generally detectable in upper and lower  respiratory specimens during the acute phase of infection. The lowest  concentration of SARS-CoV-2 viral copies this assay can detect is 250  copies / mL. A negative result does not preclude SARS-CoV-2 infection  and should not be used as the sole basis for treatment or other  patient management decisions.  A negative result may occur with  improper specimen collection / handling, submission of specimen  other  than nasopharyngeal swab, presence of viral mutation(s) within the  areas targeted by this assay, and inadequate number of viral copies  (<250 copies / mL). A negative result must be combined with clinical  observations, patient history, and epidemiological information. If result is POSITIVE SARS-CoV-2 target nucleic acids are DETECTED. The SARS-CoV-2 RNA is generally detectable in upper and lower  respiratory specimens dur ing the acute phase of infection.  Positive  results are indicative of active infection with SARS-CoV-2.  Clinical  correlation with patient history and other diagnostic information is  necessary to determine patient infection status.  Positive results do  not rule out bacterial infection or co-infection with other viruses. If result is PRESUMPTIVE POSTIVE SARS-CoV-2 nucleic acids MAY BE PRESENT.   A presumptive positive result was obtained on the submitted specimen  and confirmed on repeat testing.  While 2019 novel coronavirus  (SARS-CoV-2) nucleic acids may be present in the submitted sample  additional confirmatory testing may be necessary for epidemiological  and / or clinical management purposes  to differentiate between  SARS-CoV-2 and other Sarbecovirus currently known to infect humans.  If clinically indicated additional testing with an alternate test  methodology [phone]) is advised. The SARS-CoV-2 RNA is  generally  detectable in upper and lower respiratory sp ecimens during the acute  phase of infection. The expected result is Negative. Fact Sheet for Patients:  StrictlyIdeas.no Fact Sheet for Healthcare Providers: BankingDealers.co.za This test is not yet approved or cleared by the [place] FDA and has been authorized for detection and/or diagnosis of SARS-CoV-2 by FDA under an Emergency Use Authorization (EUA).  This EUA will remain in effect (meaning this test can be used) for the duration of the COVID-19 declaration under Section 564(b)(1) of the Act, 21 U.S.C. section 360bbb-3(b)(1), unless the authorization is terminated or revoked sooner. Performed at [facility], [place] [address] [place], [place] [address]     Blood Alcohol level:  Lab Results  Component Value Date   [facility] <10 01/31/2019   ETH <5 [id number]    Metabolic Disorder Labs: Lab Results  Component Value Date   HGBA1C 5.6 02/07/2016   MPG 117 06/10/2009   No results found for: PROLACTIN Lab Results  Component Value Date   CHOL 181 11/01/2015   TRIG 92.0 11/01/2015   HDL 51.60 11/01/2015   CHOLHDL 4 11/01/2015   VLDL 18.4 11/01/2015   LDLCALC 111 (H) 11/01/2015   LDLCALC  06/10/2009    69        Total Cholesterol/HDL:CHD Risk Coronary Heart Disease Risk Table                     Men   Women  1/2 Average Risk   3.4   3.3  Average Risk       5.0   4.4  2 X Average Risk   9.6   7.1  3 X Average Risk  23.4   11.0        Use the calculated Patient Ratio above and the CHD Risk Table to determine the patient's CHD Risk.        ATP III CLASSIFICATION (LDL):  <100     mg/dL   Optimal  100-129  mg/dL   Near or Above                    Optimal  130-159  mg/dL   Borderline  160-189  mg/dL   High  >  190     mg/dL   Very High    Physical Findings: AIMS: Facial and Oral Movements Muscles of Facial Expression: None, normal Lips and  Perioral Area: None, normal Jaw: None, normal Tongue: None, normal,Extremity Movements Upper (arms, wrists, hands, fingers): None, normal Lower (legs, knees, ankles, toes): None, normal, Trunk Movements Neck, shoulders, hips: None, normal, Overall Severity Severity of abnormal movements (highest score from questions above): None, normal Incapacitation due to abnormal movements: None, normal Patient's awareness of abnormal movements (rate only patient's report): No Awareness, Dental Status Current problems with teeth and/or dentures?: No Does patient usually wear dentures?: No  CIWA:  CIWA-Ar Total: 0 COWS:  COWS Total Score: 1  Musculoskeletal: Strength & Muscle Tone: within normal limits Gait & Station: normal/uses walker Patient leans: N/A  Psychiatric Specialty Exam: Physical Exam  ROS  Blood pressure 95/69, pulse (!) 118, temperature 98.5 F (36.9 C), temperature source Oral, resp. rate 16, height 5\' 1"  (1.549 m), weight 76.2 kg, last menstrual period 01/17/2016, SpO2 100 %.Body mass index is 31.74 kg/m.  General Appearance: Casual  Eye Contact:  Good  Speech:  Dysarthric slightly at baseline  Volume:  Decreased  Mood:  Dysphoric  Affect:  Blunt  Thought Process:  Coherent, Linear and Descriptions of Associations: Circumstantial  Orientation:  Full (Time, Place, and Person)  Thought Content:  Logical  Suicidal Thoughts:  No  Homicidal Thoughts:  No  Memory:  Immediate;   Fair  Judgement:  Fair  Insight:  Fair  Psychomotor Activity:  Normal  Concentration:  Concentration: Fair  Recall:  [employer] of Knowledge:  Fair  Language:  Fair  Akathisia:  Negative  Handed:  Right  AIMS (if indicated):     Assets:  Communication Skills Desire for Improvement  ADL's:  Intact  Cognition:  WNL  Sleep:  Number of Hours: 4.75     Treatment Plan Summary: Daily contact with patient to assess and evaluate symptoms and progress in treatment and Medication management  Continue  current precautions continue cognitive therapy.  Again we are excepting that she has a presumed exacerbation underlying bipolar condition so we will continue Depakote at low-dose perphenazine.  Probable discharge tomorrow if this rate of improvement continues.  No thoughts of harming self or others and understands what it is to contract for safety  Johnn Hai, MD 02/02/2019, 7:51 AM

## 2019-02-02 NOTE — Progress Notes (Signed)
DAR NOTE: Patient presents with anxious affect and depressed mood.  Denies suicidal thoughts, pain, auditory and visual hallucinations.  Rates depression at 0, hopelessness at 0, and anxiety at 0.  Maintained on routine safety checks.  Medications given as prescribed.  Support and encouragement offered as needed.  Attended group and participated.  States goal for today is "staying away from family."  Patient observed socializing with peers in the dayroom.  Offered no complaint.

## 2019-02-02 NOTE — Tx Team (Signed)
Interdisciplinary Treatment and Diagnostic Plan Update  02/02/2019 Time of Session: 11:05am RINOA SCHEURER MRN: [id number]  Principal Diagnosis: <principal problem not specified>  Secondary Diagnoses: Active Problems:   Bipolar 1 disorder, depressed, severe ([facility])   Current Medications:  Current Facility-Administered Medications  Medication Dose Route Frequency Provider Last Rate Last Dose  . acetaminophen (TYLENOL) tablet 650 mg  650 mg Oral Q6H PRN Lindon Romp A, NP      . alum & mag hydroxide-simeth (MAALOX/MYLANTA) 200-200-20 MG/5ML suspension 30 mL  30 mL Oral Q4H PRN Lindon Romp A, NP      . amoxicillin (AMOXIL) capsule 500 mg  500 mg Oral BID Lindon Romp A, NP   500 mg at 02/02/19 0804  . clarithromycin (BIAXIN) tablet 500 mg  500 mg Oral BID Lindon Romp A, NP   500 mg at 02/02/19 0804  . divalproex (DEPAKOTE SPRINKLE) capsule 125 mg  125 mg Oral Q12H Johnn Hai, MD   125 mg at 02/02/19 0804  . FLUoxetine (PROZAC) capsule 20 mg  20 mg Oral Daily Johnn Hai, MD   20 mg at 02/02/19 0804  . hydrochlorothiazide (MICROZIDE) capsule 12.5 mg  12.5 mg Oral Daily Lindon Romp A, NP   12.5 mg at 02/02/19 0804  . hydrOXYzine (ATARAX/VISTARIL) tablet 25 mg  25 mg Oral TID PRN Lindon Romp A, NP      . magnesium hydroxide (MILK OF MAGNESIA) suspension 30 mL  30 mL Oral Daily PRN Lindon Romp A, NP      . pantoprazole (PROTONIX) EC tablet 40 mg  40 mg Oral Daily Lindon Romp A, NP   40 mg at 02/02/19 0804  . perphenazine (TRILAFON) tablet 2 mg  2 mg Oral QHS Johnn Hai, MD      . polyethylene glycol (MIRALAX / GLYCOLAX) packet 17 g  17 g Oral Daily Lindon Romp A, NP   17 g at 02/02/19 0805  . temazepam (RESTORIL) capsule 15 mg  15 mg Oral QHS PRN Johnn Hai, MD   15 mg at 02/01/19 2111   PTA Medications: Medications Prior to Admission  Medication Sig Dispense Refill Last Dose  . amoxicillin (AMOXIL) 500 MG capsule Take 500 mg by mouth 2 (two) times daily.     . benztropine  (COGENTIN) 0.5 MG tablet Take 0.5 mg by mouth at bedtime.     . clarithromycin (BIAXIN) 500 MG tablet Take 500 mg by mouth 2 (two) times daily.     . hydrochlorothiazide (MICROZIDE) 12.5 MG capsule Take 12.5 mg by mouth daily.     Lorayne Bender SUSTENNA 156 MG/ML SUSY injection Inject 1 mL as directed every 30 (thirty) days.     . magnesium hydroxide (MILK OF MAGNESIA) 400 MG/5ML suspension Take 15 mLs by mouth daily as needed for mild constipation. 355 mL 0   . pantoprazole (PROTONIX) 40 MG tablet Take 40 mg by mouth daily.     . polyethylene glycol (MIRALAX / GLYCOLAX) 17 g packet Take 17 g by mouth daily. 14 each 0   . traZODone (DESYREL) 50 MG tablet Take 50 mg by mouth at bedtime as needed for sleep.     . cefdinir (OMNICEF) 300 MG capsule Take 1 capsule (300 mg total) by mouth 2 (two) times daily. (Patient not taking: Reported on 01/31/2019) 14 capsule 0 Not Taking at Unknown time  . gabapentin (NEURONTIN) 300 MG capsule TAKE 1 CAPSULE(300 MG) BY MOUTH AT BEDTIME (Patient not taking: Reported on 01/31/2019) 90 capsule 0 Not  Taking at Unknown time  . lubiprostone (AMITIZA) 24 MCG capsule Take 1 capsule (24 mcg total) by mouth 2 (two) times daily with a meal. (Patient not taking: Reported on 01/31/2019) 60 capsule 0 Not Taking at Unknown time  . oxymetazoline (AFRIN NASAL SPRAY) 0.05 % nasal spray Place 1 spray into both nostrils 2 (two) times daily. (Patient not taking: Reported on 01/31/2019) 30 mL 0 Not Taking at Unknown time  . rizatriptan (MAXALT) 10 MG tablet Take 1 tablet by mouth at the onset of headache. May repeat in 2 hours if needed (Patient not taking: Reported on 01/31/2019) 12 tablet 0 Not Taking at Unknown time  . traMADol (ULTRAM) 50 MG tablet Take 1 tablet (50 mg total) by mouth every 6 (six) hours as needed. (Patient not taking: Reported on 01/31/2019) 7 tablet 0 Not Taking at Unknown time    Patient Stressors: Marital or family conflict Medication change or noncompliance  Patient Strengths:  Technical sales engineer for treatment/growth  Treatment Modalities: Medication Management, Group therapy, Case management,  1 to 1 session with clinician, Psychoeducation, Recreational therapy.   Physician Treatment Plan for Primary Diagnosis: <principal problem not specified> Long Term Goal(s): Improvement in symptoms so as ready for discharge Improvement in symptoms so as ready for discharge   Short Term Goals: Ability to demonstrate self-control will improve Ability to identify and develop effective coping behaviors will improve Ability to maintain clinical measurements within normal limits will improve Compliance with prescribed medications will improve Ability to identify triggers associated with substance abuse/mental health issues will improve Ability to verbalize feelings will improve Ability to disclose and discuss suicidal ideas Ability to demonstrate self-control will improve Ability to identify and develop effective coping behaviors will improve  Medication Management: Evaluate patient's response, side effects, and tolerance of medication regimen.  Therapeutic Interventions: 1 to 1 sessions, Unit Group sessions and Medication administration.  Evaluation of Outcomes: Progressing  Physician Treatment Plan for Secondary Diagnosis: Active Problems:   Bipolar 1 disorder, depressed, severe ([place])  Long Term Goal(s): Improvement in symptoms so as ready for discharge Improvement in symptoms so as ready for discharge   Short Term Goals: Ability to demonstrate self-control will improve Ability to identify and develop effective coping behaviors will improve Ability to maintain clinical measurements within normal limits will improve Compliance with prescribed medications will improve Ability to identify triggers associated with substance abuse/mental health issues will improve Ability to verbalize feelings will improve Ability to disclose and discuss suicidal  ideas Ability to demonstrate self-control will improve Ability to identify and develop effective coping behaviors will improve     Medication Management: Evaluate patient's response, side effects, and tolerance of medication regimen.  Therapeutic Interventions: 1 to 1 sessions, Unit Group sessions and Medication administration.  Evaluation of Outcomes: Progressing   RN Treatment Plan for Primary Diagnosis: <principal problem not specified> Long Term Goal(s): Knowledge of disease and therapeutic regimen to maintain health will improve  Short Term Goals: Ability to participate in decision making will improve, Ability to verbalize feelings will improve, Ability to disclose and discuss suicidal ideas, Ability to identify and develop effective coping behaviors will improve and Compliance with prescribed medications will improve  Medication Management: RN will administer medications as ordered by provider, will assess and evaluate patient's response and provide education to patient for prescribed medication. RN will report any adverse and/or side effects to prescribing provider.  Therapeutic Interventions: 1 on 1 counseling sessions, Psychoeducation, Medication administration, Evaluate responses to treatment, Monitor  vital signs and CBGs as ordered, Perform/monitor CIWA, COWS, AIMS and Fall Risk screenings as ordered, Perform wound care treatments as ordered.  Evaluation of Outcomes: Progressing   LCSW Treatment Plan for Primary Diagnosis: <principal problem not specified> Long Term Goal(s): Safe transition to appropriate next level of care at discharge, Engage patient in therapeutic group addressing interpersonal concerns.  Short Term Goals: Engage patient in aftercare planning with referrals and resources and Increase skills for wellness and recovery  Therapeutic Interventions: Assess for all discharge needs, 1 to 1 time with Social worker, Explore available resources and support systems,  Assess for adequacy in community support network, Educate family and significant other(s) on suicide prevention, Complete Psychosocial Assessment, Interpersonal group therapy.  Evaluation of Outcomes: Progressing   Progress in Treatment: Attending groups: Yes. Participating in groups: Yes. Taking medication as prescribed: Yes. Toleration medication: Yes. Family/Significant other contact made: Yes, individual(s) contacted:  pt's friend Patient understands diagnosis: Yes. Discussing patient identified problems/goals with staff: Yes. Medical problems stabilized or resolved: Yes. Denies suicidal/homicidal ideation: Yes. Issues/concerns per patient self-inventory: No. Other:   New problem(s) identified: No, Describe:  None  New Short Term/Long Term Goal(s): Medication stabilization, elimination of SI thoughts, and development of a comprehensive mental wellness plan.   Patient Goals:  "Learn how to be more independent and not bother my family"   Discharge Plan or Barriers: Patient will discharge home and will continue services with [facility] and get a PCP through [facility].   Reason for Continuation of Hospitalization: Depression Medication stabilization  Estimated Length of Stay: 1-2 days   Attendees: Patient: Melissa Dougherty  02/02/2019   Physician: Dr. Johnn Hai, MD  02/02/2019   Nursing: Benjamine Mola, RN  02/02/2019   RN Care Manager: 02/02/2019   Social Worker: Ardelle Anton, LCSW  02/02/2019  Recreational Therapist:  02/02/2019   Other:  02/02/2019   Other:  02/02/2019  Other: 02/02/2019      Scribe for Treatment Team: Trecia Rogers, LCSW 02/02/2019 2:40 PM

## 2019-02-02 NOTE — BHH Group Notes (Signed)
Occupational Therapy Group Note  Date:  02/02/2019 Time:  3:27 PM  Group Topic/Focus:  Self Esteem Action Plan:   The focus of this group is to help patients create a plan to continue to build self-esteem after discharge.  Participation Level:  Active  Participation Quality:  Appropriate  Affect:  Depressed and Flat  Cognitive:  Alert  Insight: Lacking  Engagement in Group:  Engaged  Modes of Intervention:  Activity, Discussion, Education and Socialization  Additional Comments:    S: My family relationships decreases my self esteem  O: OT tx with focus on self esteem building this date. Education given on definition of self esteem, with both causes of low and high self esteem identified. Activity given for pt to identify a positive/aspiring trait for each letter of the alphabet. Pt to work with peers to help complete activity and build positive thinking.   A: Pt actively engaged in all discussion, stating that her family decreases her self esteem and that they are not supportive of her. Pt states how positive people increase her self esteem. She completed positive thinking activity independently.   P: OT group will be x1 per week while pt inpatient.  Zenovia Jarred, MSOT, OTR/L Behavioral Health OT/ Acute Relief OT PHP Office: [phone]  Zenovia Jarred 02/02/2019, 3:27 PM

## 2019-02-02 NOTE — Progress Notes (Signed)
Patient ID: Melissa Dougherty, female   DOB: 04-04-1971, 48 y.o.   MRN: [id number] D: Patient at nursing station requesting supplies for shower. Pt reports she had a good day and has been working on Radiographer, therapeutic. Pt reports her goal is to discharge tomorrow. Pt had to be directed multiple times to use walker for safety.   Denies  SI/HI/AVH.No behavioral issues noted.  A: Support and encouragement offered as needed to express needs. Medications administered as prescribed.  R: Patient is safe and cooperative on unit. Will continue to monitor  for safety and stability.
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
## 2019-02-03 MED ORDER — DIVALPROEX SODIUM 125 MG PO CSDR: 125.0000 mg | DELAYED_RELEASE_CAPSULE | Freq: Two times a day (BID) | ORAL | 1 refills | Status: DC

## 2019-02-03 MED ORDER — PERPHENAZINE 2 MG PO TABS: 2.0000 mg | ORAL_TABLET | Freq: Every day | ORAL | 2 refills | Status: DC

## 2019-02-03 MED ORDER — FLUOXETINE HCL 20 MG PO CAPS: 20.0000 mg | ORAL_CAPSULE | Freq: Every day | ORAL | 1 refills | Status: DC

## 2019-02-03 MED ORDER — TEMAZEPAM 15 MG PO CAPS: 15.0000 mg | ORAL_CAPSULE | Freq: Every evening | ORAL | 1 refills | Status: DC | PRN

## 2019-02-03 NOTE — Plan of Care (Signed)
  Problem: Coping: Goal: Ability to verbalize frustrations and anger appropriately will improve Outcome: Progressing Goal: Ability to demonstrate self-control will improve Outcome: Progressing

## 2019-02-03 NOTE — Progress Notes (Addendum)
[employer] scheduled for patient at 3:30pm.   Update 3:00pm. [employer] transportation canceled. Please consult CSW if transportation assistance is required.  Stephanie Acre, LCSW-A Clinical Social Worker

## 2019-02-03 NOTE — Progress Notes (Signed)
Recreation Therapy Notes  INPATIENT RECREATION TR PLAN  Patient Details Name: DAANA PETRASEK MRN: [id number] DOB: [birth year] Today's Date: 02/03/2019  Rec Therapy Plan Is patient appropriate for Therapeutic Recreation?: Yes Treatment times per week: about 3 days Estimated Length of Stay: 5-7 days TR Treatment/Interventions: Group participation (Comment)  Discharge Criteria Pt will be discharged from therapy if:: Discharged Treatment plan/goals/alternatives discussed and agreed upon by:: Patient/family  Discharge Summary Short term goals set: See patient care plan Short term goals met: Complete Progress toward goals comments: Groups attended Which groups?: Coping skills, Goal setting Reason goals not met: None Therapeutic equipment acquired: N/A Reason patient discharged from therapy: Discharge from hospital Pt/family agrees with progress & goals achieved: Yes Date patient discharged from therapy: 02/03/19    [name], LRT/CTRS  Ria Comment, Jennamarie Goings A 02/03/2019, 9:38 AM

## 2019-02-03 NOTE — BHH Suicide Risk Assessment (Signed)
[facility] Discharge Suicide Risk Assessment   Principal Problem: Exacerbation of presumed schizoaffective type condition Discharge Diagnoses: Active Problems:   Bipolar 1 disorder, depressed, severe (HCC)   Total Time spent with patient: 45 minutes  Musculoskeletal: Strength & Muscle Tone: within normal limits Gait & Station: normal/uses walker Patient leans: N/A  Psychiatric Specialty Exam: ROS  Blood pressure 123/75, pulse (!) 113, temperature 99.1 F (37.3 C), temperature source Oral, resp. rate 16, height 5\' 1"  (1.549 m), weight 76.2 kg, last menstrual period 01/17/2016, SpO2 100 %.Body mass index is 31.74 kg/m.  General Appearance: Casual  Eye Contact::  Good  Speech:  Clear and Coherent409  Volume:  Decreased  Mood:  Euthymic  Affect:  Restricted  Thought Process:  Coherent, Linear and Descriptions of Associations: Circumstantial  Orientation:  Full (Time, Place, and Person)  Thought Content:  Rumination  Suicidal Thoughts:  No  Homicidal Thoughts:  No  Memory:  Immediate;   Fair Recent;   Fair  Judgement:  Fair  Insight:  Fair  Psychomotor Activity:  Normal  Concentration:  Good  Recall:  [employer] of Knowledge:Fair  Language: Fair  Akathisia:  Negative  Handed:  Right  AIMS (if indicated):     Assets:  Communication Skills Desire for Improvement  Sleep:  Number of Hours: 2.75  Cognition: WNL  ADL's:  Intact   Mental Status Per Nursing Assessment::   On Admission:  NA  Demographic Factors:  Unemployed  Loss Factors: Decrease in vocational status  Historical Factors: NA  Risk Reduction Factors:   Sense of responsibility to family, Religious beliefs about death, Positive social support and Positive therapeutic relationship  Continued Clinical Symptoms:  Previous Psychiatric Diagnoses and Treatments  Cognitive Features That Contribute To Risk:  None    Suicide Risk:  Minimal: No identifiable suicidal ideation.  Patients presenting with no risk  factors but with morbid ruminations; may be classified as minimal risk based on the severity of the depressive symptoms  Follow-up [facility] Follow up on 02/10/2019.   Why: Follow up for possible diabetes is Thursday, 9/17 at 9:30a. with Dr. Thereasa Solo  Please bring your photo ID, insurance card and current medical medications.  Contact information: [place] [phone] [place] Follow up.   Specialty: Professional Counselor Why: Please follow up for mental health services during clinic walk-in hours Monday-Friday at 8:30a.  Be sure to bring your photo ID, insurance card, SSN and current medications.  Contact information: [facility] [place] [address] [phone]           Plan Of Care/Follow-up recommendations:  Activity:  full  Manar Smalling, MD 02/03/2019, 7:42 AM
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
## 2019-02-03 NOTE — Progress Notes (Signed)
  [facility] Adult Case Management Discharge Plan :  Will you be returning to the same living situation after discharge:  Yes,  home At discharge, do you have transportation home?: Yes,  Patient's therapist at [place] you have the ability to pay for your medications: Yes,  medicare  Release of information consent forms completed and in the chart;  Patient's signature needed at discharge.  Patient to Follow up at: Follow-up [place] Follow up on 02/10/2019.   Why: Follow up for possible diabetes is Thursday, 9/17 at 9:30a. with Dr. Thereasa Solo  Please bring your photo ID, insurance card and current medical medications.  Contact information: [place] [phone] [place] Follow up.   Specialty: Professional Counselor Why: Please follow up for mental health services during clinic walk-in hours Monday-Friday at 8:30a.  Be sure to bring your photo ID, insurance card, SSN and current medications.  Contact information: [facility] [place] [address] [phone]           Next level of care provider has access to [place] and Suicide Prevention discussed: No.; CSW attempted to contact pt's friend twice, but was unsuccessful.  Have you used any form of tobacco in the last 30 days? (Cigarettes, Smokeless Tobacco, Cigars, and/or Pipes): No  Has patient been referred to the Quitline?: N/A patient is not a smoker  Patient has been referred for addiction treatment: Yes  Trecia Rogers, LCSW 02/03/2019, 9:30 AM

## 2019-02-03 NOTE — Plan of Care (Signed)
Pt was able to identify coping skills at completion of recreation therapy group sessions.   Marchello Rothgeb, LRT/CTRS 

## 2019-02-03 NOTE — Progress Notes (Addendum)
Recreation Therapy Notes  Date: 9.10.20 Time: 1000 Location: 500 Hall Dayroom   Group Topic: Communication, Team Building, Problem Solving  Goal Area(s) Addresses:  Patient will effectively work with peer towards shared goal.  Patient will identify skill used to make activity successful.  Patient will identify how skills used during activity can be used to reach post d/c goals.   Behavioral Response:  Minimal  Intervention: STEM Activity   Activity: [employer]. Patients were provided the following materials: 5 drinking straws, 5 rubber bands, 5 paper clips, 2 index cards and 2 drinking cups.  Using the provided materials patients were asked to build a launching mechanisms to launch a ping pong ball approximately 12 feet. Patients were divided into teams of 3-5.   Education: Education officer, community, Dentist.   Education Outcome: Acknowledges education/In group clarification offered/Needs additional education.   Clinical Observations/Feedback:  Pt attempted to complete activity with peer.  Pt became frustrated and quit.  Pt remained in group and was social with peers.     [name], LRT/CTRS    Ria Comment, Twinkle Sockwell A 02/03/2019 10:40 AM

## 2019-02-04 NOTE — Progress Notes (Signed)
Patient ID: Melissa Dougherty, female   DOB: 10/05/1970, 48 y.o.   MRN: [id number]  D: Pt alert and oriented on the unit.   A: Education, support, and encouragement provided. Discharge summary, medications and follow up appointments reviewed with pt. Suicide prevention resources provided, including "My 3 App." Pt's belongings in locker # returned and belongings sheet signed.  R: Pt denies SI/HI, A/VH, pain, or any concerns at this time. Pt ambulatory on and off unit. Pt discharged to lobby.

## 2019-02-04 NOTE — Progress Notes (Signed)
Recreation Therapy Notes  Date: 9.11.20 Time: 1000 Location: 500 Hall Dayroom  Group Topic: Triggers  Goal Area(s) Addresses:  Patient will identify what triggers a response from them. Patient will identify how to deal with triggers when they arise. Patient will identify how to avoid dealing with triggers.  Behavioral Response:  Engaged  Intervention:  Worksheet  Activity: Triggers.  Patients were to identify their three biggest triggers.  Patients were to also identify ways to avoid dealing with triggers and how they face triggers head on.  Education: Communication, Discharge Planning  Education Outcome: Acknowledges understanding/In group clarification offered/Needs additional education.   Clinical Observations/Feedback:  Pt stated her triggers are people not believing in her and being stressed out.  Pt avoid these triggers by not being around the people who stress her and don't believe in her.  Pt deals with triggers head on by being more independent, talking to therapist and walking away.     [name], LRT/CTRS    [name] A 02/04/2019 11:53 AM

## 2019-02-04 NOTE — Progress Notes (Signed)
  [facility] Adult Case Management Discharge Plan :  Will you be returning to the same living situation after discharge:  Yes,  home At discharge, do you have transportation home?: No. [place] used.  Do you have the ability to pay for your medications: Yes,  [employer] Medicare  Release of information consent forms completed and in the chart;  Patient's signature needed at discharge.  Patient to Follow up at: Follow-up [facility] Follow up on 02/10/2019.   Why: Follow up for possible diabetes is Thursday, 9/17 at 9:30a. with Dr. Thereasa Solo  Please bring your photo ID, insurance card and current medical medications.  Contact information: [place] [phone] [facility] Follow up.   Specialty: Professional Counselor Why: Please follow up for mental health services during clinic walk-in hours Monday-Friday at 8:30a.  Be sure to bring your photo ID, insurance card, SSN and current medications.  Contact information: [facility] [place] [phone]           Next level of care provider has access to [place] and Suicide Prevention discussed: No. Attempts made.   Have you used any form of tobacco in the last 30 days? (Cigarettes, Smokeless Tobacco, Cigars, and/or Pipes): No  Has patient been referred to the Quitline?: N/A patient is not a smoker  Patient has been referred for addiction treatment: N/A  Joanne Chars, LCSW 02/04/2019, 9:43 AM

## 2019-02-04 NOTE — Progress Notes (Signed)
Patient ID: Melissa Dougherty, female   DOB: [birth year], 48 y.o.   MRN: [id number]   [place] NOVEL CORONAVIRUS (COVID-19) DAILY CHECK-OFF SYMPTOMS - answer yes or no to each - every day NO YES  Have you had a fever in the past 24 hours?  . Fever (Temp > 37.80C / 100F) X   Have you had any of these symptoms in the past 24 hours? . New Cough .  Sore Throat  .  Shortness of Breath .  Difficulty Breathing .  Unexplained Body Aches   X   Have you had any one of these symptoms in the past 24 hours not related to allergies?   . Runny Nose .  Nasal Congestion .  Sneezing   X   If you have had runny nose, nasal congestion, sneezing in the past 24 hours, has it worsened?  X   EXPOSURES - check yes or no X   Have you traveled outside the state in the past 14 days?  X   Have you been in contact with someone with a confirmed diagnosis of COVID-19 or PUI in the past 14 days without wearing appropriate PPE?  X   Have you been living in the same home as a person with confirmed diagnosis of COVID-19 or a PUI (household contact)?    X   Have you been diagnosed with COVID-19?    X              What to do next: Answered NO to all: Answered YES to anything:   Proceed with unit schedule Follow the [facility] Inpatient Flowsheet.

## 2019-02-04 NOTE — Progress Notes (Signed)
CSW scheduled [facility]/Lyft transportation for patient at 10:30am to assist with discharge planning.  Stephanie Acre, LCSW-A Clinical Social Worker

## 2019-02-04 NOTE — Progress Notes (Signed)
D: Patient denies SI, HI or AVH. Patient presents as flat and anxious but pleasant.  Pt. Is visualized on the unit interacting with her peers and staff.  Pt. To be discharged today and is aware of same.  Pt. Denies any physical complaints.   A: Patient given emotional support from RN. Patient encouraged to come to staff with concerns and/or questions. Patient's medication routine continued. Patient's orders and plan of care reviewed.   R: Patient remains appropriate and cooperative. Will continue to monitor patient q15 minutes for safety.

## 2019-02-04 NOTE — Progress Notes (Signed)
Pt falling asleep in the dayroom when taking her vitals. Staff explain to the pt purpose going to sleep at night to get proper rest. .
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
## 2019-02-09 ENCOUNTER — Ambulatory Visit: Payer: Medicare HMO | Admitting: Podiatry
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
## 2019-02-10 ENCOUNTER — Other Ambulatory Visit: Payer: Self-pay

## 2019-02-10 ENCOUNTER — Ambulatory Visit: Payer: Medicare HMO | Attending: Family Medicine | Admitting: Physician Assistant

## 2019-02-10 DIAGNOSIS — H9193 Unspecified hearing loss, bilateral: Secondary | ICD-10-CM | POA: Diagnosis not present

## 2019-02-10 DIAGNOSIS — F3110 Bipolar disorder, current episode manic without psychotic features, unspecified: Secondary | ICD-10-CM

## 2019-02-10 DIAGNOSIS — F32A Depression, unspecified: Secondary | ICD-10-CM

## 2019-02-10 DIAGNOSIS — F329 Major depressive disorder, single episode, unspecified: Secondary | ICD-10-CM

## 2019-02-10 DIAGNOSIS — H524 Presbyopia: Secondary | ICD-10-CM | POA: Diagnosis not present

## 2019-02-10 DIAGNOSIS — Z09 Encounter for follow-up examination after completed treatment for conditions other than malignant neoplasm: Secondary | ICD-10-CM | POA: Diagnosis not present

## 2019-02-22 ENCOUNTER — Telehealth: Payer: Self-pay | Admitting: Licensed Clinical Social Worker

## 2019-02-22 NOTE — Telephone Encounter (Signed)
Call placed to patient to follow up on [facility] referral. LCSW left message requesting a return call.

## 2019-03-03 DIAGNOSIS — E119 Type 2 diabetes mellitus without complications: Secondary | ICD-10-CM | POA: Diagnosis not present

## 2019-03-03 DIAGNOSIS — H25813 Combined forms of age-related cataract, bilateral: Secondary | ICD-10-CM | POA: Diagnosis not present

## 2019-03-10 ENCOUNTER — Emergency Department (HOSPITAL_COMMUNITY)
Admission: EM | Admit: 2019-03-10 | Discharge: 2019-03-10 | Disposition: A | Discharge status: Home / Self Care | Payer: Medicare HMO | Attending: Emergency Medicine | Admitting: Emergency Medicine

## 2019-03-10 ENCOUNTER — Other Ambulatory Visit: Payer: Self-pay

## 2019-03-10 ENCOUNTER — Encounter (HOSPITAL_COMMUNITY): Payer: Self-pay

## 2019-03-10 DIAGNOSIS — F329 Major depressive disorder, single episode, unspecified: Secondary | ICD-10-CM

## 2019-03-10 DIAGNOSIS — F3132 Bipolar disorder, current episode depressed, moderate: Secondary | ICD-10-CM | POA: Insufficient documentation

## 2019-03-10 DIAGNOSIS — Z046 Encounter for general psychiatric examination, requested by authority: Secondary | ICD-10-CM | POA: Diagnosis present

## 2019-03-10 DIAGNOSIS — Z79899 Other long term (current) drug therapy: Secondary | ICD-10-CM | POA: Diagnosis not present

## 2019-03-10 DIAGNOSIS — R456 Violent behavior: Secondary | ICD-10-CM | POA: Diagnosis not present

## 2019-03-10 DIAGNOSIS — F32A Depression, unspecified: Secondary | ICD-10-CM

## 2019-03-10 LAB — CBC
HCT: 39.4 % (ref 36.0–46.0)
Hemoglobin: 12.3 g/dL (ref 12.0–15.0)
MCH: 21.4 pg — ABNORMAL LOW (ref 26.0–34.0)
MCHC: 31.2 g/dL (ref 30.0–36.0)
MCV: 68.5 fL — ABNORMAL LOW (ref 80.0–100.0)
Platelets: 219 10*3/uL (ref 150–400)
RBC: 5.75 MIL/uL — ABNORMAL HIGH (ref 3.87–5.11)
RDW: 19.6 % — ABNORMAL HIGH (ref 11.5–15.5)
WBC: 6.7 10*3/uL (ref 4.0–10.5)
nRBC: 0 % (ref 0.0–0.2)

## 2019-03-10 LAB — COMPREHENSIVE METABOLIC PANEL
ALT: 18 U/L (ref 0–44)
AST: 24 U/L (ref 15–41)
Albumin: 3.8 g/dL (ref 3.5–5.0)
Alkaline Phosphatase: 100 U/L (ref 38–126)
Anion gap: 10 (ref 5–15)
BUN: 8 mg/dL (ref 6–20)
CO2: 23 mmol/L (ref 22–32)
Calcium: 9 mg/dL (ref 8.9–10.3)
Chloride: 104 mmol/L (ref 98–111)
Creatinine, Ser: 0.98 mg/dL (ref 0.44–1.00)
GFR calc Af Amer: >60 mL/min (ref >60)
GFR calc non Af Amer: >60 mL/min (ref >60)
Glucose, Bld: 113 mg/dL — ABNORMAL HIGH (ref 70–99)
Potassium: 3.5 mmol/L (ref 3.5–5.1)
Sodium: 137 mmol/L (ref 135–145)
Total Bilirubin: 0.6 mg/dL (ref 0.3–1.2)
Total Protein: 7.8 g/dL (ref 6.5–8.1)

## 2019-03-10 LAB — RAPID URINE DRUG SCREEN, HOSP PERFORMED
Amphetamines: NOT DETECTED
Barbiturates: NOT DETECTED
Benzodiazepines: POSITIVE — AB
Cocaine: NOT DETECTED
Opiates: NOT DETECTED
Tetrahydrocannabinol: NOT DETECTED

## 2019-03-10 LAB — I-STAT BETA HCG BLOOD, ED (MC, WL, AP ONLY): I-stat hCG, quantitative: <5 m[IU]/mL (ref <5)

## 2019-03-10 LAB — ETHANOL: Alcohol, Ethyl (B): <10 mg/dL (ref <10)
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
## 2019-03-10 NOTE — ED Notes (Signed)
Pt verbalized discharge instructions and follow up care. Alert and ambulatory. No IV. Has ride home 
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
## 2019-03-10 NOTE — BH Assessment (Signed)
[facility] Assessment Progress Note   Clinician has called twice to [facility] and requested teleassesment machine be put in room.  Clinician has called the teleassessment machine six times and not gotten an answer.  TTS to continue to try to see patient.
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
## 2019-03-10 NOTE — Discharge Instructions (Addendum)
Follow up with [facility].  °

## 2019-03-14 DIAGNOSIS — F3113 Bipolar disorder, current episode manic without psychotic features, severe: Secondary | ICD-10-CM | POA: Diagnosis not present

## 2019-03-16 DIAGNOSIS — H04123 Dry eye syndrome of bilateral lacrimal glands: Secondary | ICD-10-CM | POA: Diagnosis not present

## 2019-03-16 DIAGNOSIS — H25813 Combined forms of age-related cataract, bilateral: Secondary | ICD-10-CM | POA: Diagnosis not present

## 2019-03-19 ENCOUNTER — Emergency Department (HOSPITAL_COMMUNITY): Payer: Medicare HMO

## 2019-03-19 ENCOUNTER — Emergency Department (HOSPITAL_COMMUNITY)
Admission: EM | Admit: 2019-03-19 | Discharge: 2019-03-20 | Disposition: A | Discharge status: Home / Self Care | Payer: Medicare HMO | Attending: Emergency Medicine | Admitting: Emergency Medicine

## 2019-03-19 ENCOUNTER — Other Ambulatory Visit: Payer: Self-pay

## 2019-03-19 ENCOUNTER — Encounter (HOSPITAL_COMMUNITY): Payer: Self-pay | Admitting: *Deleted

## 2019-03-19 DIAGNOSIS — R519 Headache, unspecified: Secondary | ICD-10-CM | POA: Insufficient documentation

## 2019-03-19 DIAGNOSIS — R55 Syncope and collapse: Secondary | ICD-10-CM | POA: Insufficient documentation

## 2019-03-19 DIAGNOSIS — S99922A Unspecified injury of left foot, initial encounter: Secondary | ICD-10-CM | POA: Diagnosis not present

## 2019-03-19 DIAGNOSIS — R531 Weakness: Secondary | ICD-10-CM | POA: Diagnosis not present

## 2019-03-19 DIAGNOSIS — R102 Pelvic and perineal pain: Secondary | ICD-10-CM | POA: Diagnosis not present

## 2019-03-19 DIAGNOSIS — M7989 Other specified soft tissue disorders: Secondary | ICD-10-CM | POA: Diagnosis not present

## 2019-03-19 DIAGNOSIS — S99912A Unspecified injury of left ankle, initial encounter: Secondary | ICD-10-CM | POA: Diagnosis not present

## 2019-03-19 DIAGNOSIS — I959 Hypotension, unspecified: Secondary | ICD-10-CM | POA: Diagnosis not present

## 2019-03-19 LAB — PROTIME-INR
INR: 1 (ref 0.8–1.2)
Prothrombin Time: 13.5 seconds (ref 11.4–15.2)

## 2019-03-19 LAB — DIFFERENTIAL
Abs Immature Granulocytes: 0.01 10*3/uL (ref 0.00–0.07)
Basophils Absolute: 0 10*3/uL (ref 0.0–0.1)
Basophils Relative: 0 %
Eosinophils Absolute: 0.1 10*3/uL (ref 0.0–0.5)
Eosinophils Relative: 1 %
Immature Granulocytes: 0 %
Lymphocytes Relative: 18 %
Lymphs Abs: 1.3 10*3/uL (ref 0.7–4.0)
Monocytes Absolute: 0.5 10*3/uL (ref 0.1–1.0)
Monocytes Relative: 6 %
Neutro Abs: 5.5 10*3/uL (ref 1.7–7.7)
Neutrophils Relative %: 75 %

## 2019-03-19 LAB — I-STAT CHEM 8, ED
BUN: 8 mg/dL (ref 6–20)
Calcium, Ion: 1.04 mmol/L — ABNORMAL LOW (ref 1.15–1.40)
Chloride: 104 mmol/L (ref 98–111)
Creatinine, Ser: 1.1 mg/dL — ABNORMAL HIGH (ref 0.44–1.00)
Glucose, Bld: 119 mg/dL — ABNORMAL HIGH (ref 70–99)
HCT: 38 % (ref 36.0–46.0)
Hemoglobin: 12.9 g/dL (ref 12.0–15.0)
Potassium: 3.6 mmol/L (ref 3.5–5.1)
Sodium: 138 mmol/L (ref 135–145)
TCO2: 25 mmol/L (ref 22–32)

## 2019-03-19 LAB — CBC
HCT: 36.4 % (ref 36.0–46.0)
Hemoglobin: 11.5 g/dL — ABNORMAL LOW (ref 12.0–15.0)
MCH: 21.7 pg — ABNORMAL LOW (ref 26.0–34.0)
MCHC: 31.6 g/dL (ref 30.0–36.0)
MCV: 68.5 fL — ABNORMAL LOW (ref 80.0–100.0)
Platelets: 199 10*3/uL (ref 150–400)
RBC: 5.31 MIL/uL — ABNORMAL HIGH (ref 3.87–5.11)
RDW: 18.6 % — ABNORMAL HIGH (ref 11.5–15.5)
WBC: 7.5 10*3/uL (ref 4.0–10.5)
nRBC: 0 % (ref 0.0–0.2)

## 2019-03-19 LAB — COMPREHENSIVE METABOLIC PANEL
ALT: 20 U/L (ref 0–44)
AST: 32 U/L (ref 15–41)
Albumin: 3.2 g/dL — ABNORMAL LOW (ref 3.5–5.0)
Alkaline Phosphatase: 82 U/L (ref 38–126)
Anion gap: 10 (ref 5–15)
BUN: 8 mg/dL (ref 6–20)
CO2: 23 mmol/L (ref 22–32)
Calcium: 8.5 mg/dL — ABNORMAL LOW (ref 8.9–10.3)
Chloride: 105 mmol/L (ref 98–111)
Creatinine, Ser: 1.14 mg/dL — ABNORMAL HIGH (ref 0.44–1.00)
GFR calc Af Amer: >60 mL/min (ref >60)
GFR calc non Af Amer: 57 mL/min — ABNORMAL LOW (ref >60)
Glucose, Bld: 122 mg/dL — ABNORMAL HIGH (ref 70–99)
Potassium: 3.5 mmol/L (ref 3.5–5.1)
Sodium: 138 mmol/L (ref 135–145)
Total Bilirubin: 1 mg/dL (ref 0.3–1.2)
Total Protein: 6.7 g/dL (ref 6.5–8.1)

## 2019-03-19 LAB — I-STAT BETA HCG BLOOD, ED (MC, WL, AP ONLY): I-stat hCG, quantitative: <5 m[IU]/mL (ref <5)

## 2019-03-19 LAB — APTT: aPTT: 30 seconds (ref 24–36)

## 2019-03-19 LAB — CBG MONITORING, ED: Glucose-Capillary: 110 mg/dL — ABNORMAL HIGH (ref 70–99)

## 2019-03-19 MED ORDER — SODIUM CHLORIDE 0.9 % IV BOLUS
1000.0000 mL | Freq: Once | INTRAVENOUS | Status: AC
  Administered 2019-03-19: 1000 mL via INTRAVENOUS

## 2019-03-19 MED ORDER — SODIUM CHLORIDE 0.9% FLUSH
3.0000 mL | Freq: Once | INTRAVENOUS | Status: AC
  Administered 2019-03-19: 3 mL via INTRAVENOUS

## 2019-03-19 NOTE — ED Notes (Signed)
Unknown last known well; pt reports she [place] to the mall this morning and was ambulatory. L leg falls to the bed during assessment, states " I just feel weak all over," no drift in R leg

## 2019-03-19 NOTE — Discharge Instructions (Signed)
Please follow up with your primary care provider within 5-7 days for re-evaluation of your symptoms. If you do not have a primary care provider, information for a healthcare clinic has been provided for you to make arrangements for follow up care. Please return to the emergency department for any new or worsening symptoms. ° °
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
## 2019-03-20 NOTE — ED Notes (Signed)
Patient verbalizes understanding of discharge instructions. Opportunity for questioning and answers were provided. Armband removed by staff, pt discharged from ED ambulatory to taxi cab

## 2019-03-20 NOTE — ED Notes (Addendum)
Asked pt how she was getting home this evening and she stated "taxi". As I was preparing pts' d/c paperwork she stated "y'all ain't even gonna give me no medicine?". Asked pt was kind of medicine she felt she needed and she stated "shit, I don't know sleeping pills or something". Advised pt she could follow up with her PCP regarding her problems sleeping, but it would be unsafe to give her sleeping pills and then put her in a taxi home. Pt then called an acquaintance and stated "Shit, this place is a fucking joke. They bout to discharge me without any medications or anything. Looks like I'm gonna have to go out and start using some drugs and really get myself fucked up for them to give me my pills." Questioned pt regarding SI, which she denied. Pt continued to loudly discuss on the phone how she felt and verbalized that "we ain't do shit for her". Explained to pt the care she received while here and pt was non-redirectable from her phone conversation. Escorted pt to lobby, taxi was called from lobby. Pt discharged independently to taxi.

## 2019-03-21 ENCOUNTER — Telehealth: Payer: Self-pay | Admitting: Licensed Clinical Social Worker

## 2019-03-21 NOTE — Telephone Encounter (Signed)
Call placed to pt regarding recent ED visit for depression. Pt expressed a desire for additional behavioral health support. Behavioral Health Consult scheduled with Christa See 03/28/19.

## 2019-03-24 ENCOUNTER — Telehealth: Payer: Self-pay | Admitting: Licensed Clinical Social Worker

## 2019-03-24 NOTE — Telephone Encounter (Signed)
Behavioral Health Intern attempted to reach Pt to remind her of upcoming [place] appointment 03/28/19. Pt did not answer, was unable to reach voicemail. BHI also attempted to call mother's phone number without success.

## 2019-03-28 ENCOUNTER — Ambulatory Visit: Payer: Medicare HMO | Admitting: Licensed Clinical Social Worker

## 2019-03-30 ENCOUNTER — Ambulatory Visit: Payer: Medicare HMO | Admitting: Family Medicine

## 2019-03-30 NOTE — Progress Notes (Deleted)
  Subjective:   Patient ID: Melissa Dougherty    DOB: 29-Aug-1970, 48 y.o. female   MRN: [id number]  Melissa Dougherty is a 48 y.o. female with a history of GERD, seizure d/o, bipolar d/o, depression here for   HPI:   Patient presents today to establish care. Other Concerns: ***   PMH: GERD, seizure d/o, bipolar d/o, depression, h/o alcohol use disorder, h/o illicit drug use - BH appt 11/2*** - ED visit 10/15 - depressed, not taking bipolar meds. Previously seen at [facility] and [facility] in the past   Medications: Depakote 125 mg q12h, Prozac 20 mg daily, perphenazine 2 mg qhs, temazepam 15 mg qhs prn, trazodone 50 mg qhs prn, HCTZ 12.5 mg daily, omeprazole 20 mg daily***, pantoprazole 40mg  daily, amoxicillin***, eyedrops   Social History:  Lives with *** Employment: ***   Exercise: *** Diet: ***   Smoking: never smoker  Alcohol: *** Illicit Drug use: ***   Health Maintenance:  - due for flu vaccine, pap smear  FH:  Cancers in family: *** Family history of early heart disease: *** -Mom: Diabetes, hypertension -Dad: Alcohol use disorder  Reproductive History - G2P2002 - Menses: *** - Contraception: BTL - Cancer screening: 2017 ASCUS w/ neg HPV, ASCCP rec'd rpt at 1 yr***   Review of Systems:  Per HPI.  Medications and smoking status reviewed.  Objective:   LMP 03/14/2016  Vitals and nursing note reviewed.  General: well nourished, well developed, in no acute distress with non-toxic appearance HEENT: normocephalic, atraumatic, moist mucous membranes Neck: supple, non-tender without lymphadenopathy CV: regular rate and rhythm without murmurs, rubs, or gallops, no lower extremity edema Lungs: clear to auscultation bilaterally with normal work of breathing Abdomen: soft, non-tender, non-distended, no masses or organomegaly palpable, normoactive bowel sounds Skin: warm, dry, no rashes or lesions Extremities: warm and well perfused, normal tone MSK: ROM grossly intact,  gait normal Neuro: Alert and oriented, speech normal  Assessment & Plan:   No problem-specific Assessment & Plan notes found for this encounter.  No orders of the defined types were placed in this encounter.  No orders of the defined types were placed in this encounter.   Rory Percy, DO PGY-3, [facility] 03/30/2019 7:47 AM

## 2019-04-08 DIAGNOSIS — H25811 Combined forms of age-related cataract, right eye: Secondary | ICD-10-CM | POA: Diagnosis not present

## 2019-05-04 ENCOUNTER — Encounter (HOSPITAL_COMMUNITY): Payer: Self-pay

## 2019-05-04 ENCOUNTER — Other Ambulatory Visit: Payer: Self-pay

## 2019-05-04 ENCOUNTER — Emergency Department (HOSPITAL_COMMUNITY)
Admission: EM | Admit: 2019-05-04 | Discharge: 2019-05-06 | Disposition: A | Discharge status: Other Acute Inpatient Hospital | Payer: Medicare HMO | Attending: Emergency Medicine | Admitting: Emergency Medicine

## 2019-05-04 DIAGNOSIS — Z79899 Other long term (current) drug therapy: Secondary | ICD-10-CM | POA: Insufficient documentation

## 2019-05-04 DIAGNOSIS — F3161 Bipolar disorder, current episode mixed, mild: Secondary | ICD-10-CM | POA: Diagnosis not present

## 2019-05-04 DIAGNOSIS — Z03818 Encounter for observation for suspected exposure to other biological agents ruled out: Secondary | ICD-10-CM | POA: Diagnosis not present

## 2019-05-04 DIAGNOSIS — R451 Restlessness and agitation: Secondary | ICD-10-CM

## 2019-05-04 DIAGNOSIS — Z20828 Contact with and (suspected) exposure to other viral communicable diseases: Secondary | ICD-10-CM | POA: Diagnosis not present

## 2019-05-04 DIAGNOSIS — E876 Hypokalemia: Secondary | ICD-10-CM | POA: Diagnosis not present

## 2019-05-04 DIAGNOSIS — F329 Major depressive disorder, single episode, unspecified: Secondary | ICD-10-CM | POA: Diagnosis present

## 2019-05-04 DIAGNOSIS — F3160 Bipolar disorder, current episode mixed, unspecified: Secondary | ICD-10-CM | POA: Diagnosis present

## 2019-05-04 LAB — CBC
HCT: 37.1 % (ref 36.0–46.0)
Hemoglobin: 11.7 g/dL — ABNORMAL LOW (ref 12.0–15.0)
MCH: 21.8 pg — ABNORMAL LOW (ref 26.0–34.0)
MCHC: 31.5 g/dL (ref 30.0–36.0)
MCV: 69.1 fL — ABNORMAL LOW (ref 80.0–100.0)
Platelets: 205 10*3/uL (ref 150–400)
RBC: 5.37 MIL/uL — ABNORMAL HIGH (ref 3.87–5.11)
RDW: 19.1 % — ABNORMAL HIGH (ref 11.5–15.5)
WBC: 9.5 10*3/uL (ref 4.0–10.5)
nRBC: 0 % (ref 0.0–0.2)

## 2019-05-04 LAB — RAPID URINE DRUG SCREEN, HOSP PERFORMED
Amphetamines: NOT DETECTED
Barbiturates: NOT DETECTED
Benzodiazepines: NOT DETECTED
Cocaine: NOT DETECTED
Opiates: NOT DETECTED
Tetrahydrocannabinol: NOT DETECTED

## 2019-05-04 LAB — ACETAMINOPHEN LEVEL: Acetaminophen (Tylenol), Serum: <10 ug/mL — ABNORMAL LOW (ref 10–30)

## 2019-05-04 LAB — COMPREHENSIVE METABOLIC PANEL
ALT: 21 U/L (ref 0–44)
AST: 30 U/L (ref 15–41)
Albumin: 3.4 g/dL — ABNORMAL LOW (ref 3.5–5.0)
Alkaline Phosphatase: 102 U/L (ref 38–126)
Anion gap: 13 (ref 5–15)
BUN: 6 mg/dL (ref 6–20)
CO2: 22 mmol/L (ref 22–32)
Calcium: 8.6 mg/dL — ABNORMAL LOW (ref 8.9–10.3)
Chloride: 100 mmol/L (ref 98–111)
Creatinine, Ser: 1.01 mg/dL — ABNORMAL HIGH (ref 0.44–1.00)
GFR calc Af Amer: >60 mL/min (ref >60)
GFR calc non Af Amer: >60 mL/min (ref >60)
Glucose, Bld: 168 mg/dL — ABNORMAL HIGH (ref 70–99)
Potassium: 2.9 mmol/L — ABNORMAL LOW (ref 3.5–5.1)
Sodium: 135 mmol/L (ref 135–145)
Total Bilirubin: 0.9 mg/dL (ref 0.3–1.2)
Total Protein: 7.4 g/dL (ref 6.5–8.1)

## 2019-05-04 LAB — ETHANOL: Alcohol, Ethyl (B): <10 mg/dL (ref <10)

## 2019-05-04 LAB — I-STAT BETA HCG BLOOD, ED (MC, WL, AP ONLY): I-stat hCG, quantitative: <5 m[IU]/mL (ref <5)

## 2019-05-04 LAB — SALICYLATE LEVEL: Salicylate Lvl: <7 mg/dL (ref 2.8–30.0)

## 2019-05-04 MED ORDER — PERPHENAZINE 2 MG PO TABS
2.0000 mg | ORAL_TABLET | Freq: Every day | ORAL | Status: DC
  Administered 2019-05-04: 2 mg via ORAL
  Filled 2019-05-04 (×2): qty 1

## 2019-05-04 MED ORDER — BENZTROPINE MESYLATE 0.5 MG PO TABS
0.5000 mg | ORAL_TABLET | Freq: Every day | ORAL | Status: DC
  Administered 2019-05-04 – 2019-05-05 (×2): 0.5 mg via ORAL
  Filled 2019-05-04 (×2): qty 1

## 2019-05-04 MED ORDER — HYDROCHLOROTHIAZIDE 12.5 MG PO CAPS
12.5000 mg | ORAL_CAPSULE | Freq: Every day | ORAL | Status: DC
  Administered 2019-05-04 – 2019-05-05 (×2): 12.5 mg via ORAL
  Filled 2019-05-04 (×3): qty 1

## 2019-05-04 MED ORDER — LORAZEPAM 1 MG PO TABS
1.0000 mg | ORAL_TABLET | ORAL | Status: AC | PRN
  Administered 2019-05-04: 1 mg via ORAL
  Filled 2019-05-04: qty 1

## 2019-05-04 MED ORDER — TEMAZEPAM 15 MG PO CAPS: 15.0000 mg | ORAL_CAPSULE | Freq: Every evening | ORAL | Status: DC | PRN

## 2019-05-04 MED ORDER — DIVALPROEX SODIUM 125 MG PO CSDR
125.0000 mg | DELAYED_RELEASE_CAPSULE | Freq: Two times a day (BID) | ORAL | Status: DC
  Administered 2019-05-04 – 2019-05-05 (×2): 125 mg via ORAL
  Filled 2019-05-04 (×4): qty 1

## 2019-05-04 MED ORDER — ZIPRASIDONE MESYLATE 20 MG IM SOLR: 10.0000 mg | Freq: Four times a day (QID) | INTRAMUSCULAR | Status: DC | PRN

## 2019-05-04 MED ORDER — TRAZODONE HCL 50 MG PO TABS: 50.0000 mg | ORAL_TABLET | Freq: Every evening | ORAL | Status: DC | PRN

## 2019-05-04 MED ORDER — FLUOXETINE HCL 20 MG PO CAPS
20.0000 mg | ORAL_CAPSULE | Freq: Every day | ORAL | Status: DC
  Administered 2019-05-04 – 2019-05-05 (×2): 20 mg via ORAL
  Filled 2019-05-04 (×2): qty 1

## 2019-05-04 NOTE — BH Assessment (Signed)
Clinician attempted to meet with pt to complete the [facility] Assessment but pt had been given 42m Geodon at 2108. Note: the [place] was ordered at 2058 (10 min prior to the Geodon injection being given), which would lead one to conclude that pt was not stable at the time the Assessment was ordered. For TTS services to flow most efficiently, TTS requests orders not be placed until pts are:  1) In a room 2) Have met with a provider 3) Are medically cleared 4) Are able to meet with clinician (awake/can be awoken, not under the influence of substances, have not been sedated, etc.)  Pt is still under the influence of the 173mGeodon she was given at 2108 and was not able to be awoken. Assessment will be attempted at a later time.

## 2019-05-04 NOTE — ED Provider Notes (Signed)
[facility] DEPT Provider Note   CSN: [id number] Arrival date & time: 05/04/19  2047     History   Chief Complaint Chief Complaint  Patient presents with  . IVC    HPI Melissa Dougherty is a 48 y.o. female.     HPI Patient presents under involuntary commitment via [employer]. History is obtained by those individuals, with some details offered by the patient, though she has no insight into her presentation. Level 5 caveat secondary to psychiatric disease, with known history of bipolar disorder, depression. Reportedly the patient has been irritating neighbors, has been combative, has been disruptive, has not been taking medication. The patient herself states that she wants people to leave her alone, she denies medical complaints, denies pain, but offers a rambling, tangential, yelling history of her own personal affairs. Past Medical History:  Diagnosis Date  . Alcohol abuse   . Allergy   . Bipolar disorder ([place])   . Depression   . Drug abuse ([place])   . GERD (gastroesophageal reflux disease)   . Heart murmur   . Seizures ([place])    Last seizure was in 1989    Patient Active Problem List   Diagnosis Date Noted  . Bipolar 1 disorder, depressed, severe ([place]) 01/31/2019  . Sensorineural hearing loss (SNHL) of both ears 08/09/2018  . Increased storage iron 02/18/2016  . Neuropathy 02/18/2016  . Otitis media 02/18/2016  . Fatigue 02/07/2016  . Constipation 02/07/2016  . Acute upper respiratory infection 01/25/2016  . Headache 12/25/2015  . Routine general medical examination at a health care facility 11/01/2015  . Medicare welcome exam 11/01/2015  . Abdominal bloating 10/16/2015  . Bipolar 1 disorder, mixed ([place]) 01/10/2014  . Bipolar I disorder, most recent episode (or current) mixed, moderate 12/24/2013  . Adjustment disorder with mixed anxiety and depressed mood 12/23/2013  . MDD (major depressive disorder) 12/23/2013  . Depressive disorder  12/22/2013  . Papanicolaou smear of cervix with atypical squamous cells of undetermined significance (ASC-US) 10/12/2013  . DEPRESSION 02/17/2007  . GERD 02/17/2007  . SEIZURE DISORDER, HX OF 02/17/2007    Past Surgical History:  Procedure Laterality Date  . BACK SURGERY    . TUBAL LIGATION       OB History    Gravida  2   Para  2   Term  2   Preterm      AB      Living  2     SAB      TAB      Ectopic      Multiple      Live Births  2            Home Medications    Prior to Admission medications   Medication Sig Start Date End Date Taking? Authorizing Provider  amoxicillin (AMOXIL) 500 MG capsule  03/03/19   [provider]  benztropine (COGENTIN) 0.5 MG tablet Take 0.5 mg by mouth at bedtime. 01/17/19   [provider]  BESIVANCE 0.6 % SUSP Place 1 drop into the right eye 2 (two) times daily. 03/17/19   [provider]  clarithromycin Binnie Rail) 500 MG tablet  03/03/19   [provider]  divalproex (DEPAKOTE SPRINKLE) 125 MG capsule Take 1 capsule (125 mg total) by mouth every 12 (twelve) hours. 02/03/19   Johnn Hai, MD  FLUoxetine (PROZAC) 20 MG capsule Take 1 capsule (20 mg total) by mouth daily. 02/04/19   Johnn Hai, MD  hydrochlorothiazide (  MICROZIDE) 12.5 MG capsule Take 12.5 mg by mouth daily. 01/26/19   [provider]  INVEGA SUSTENNA 156 MG/ML SUSY injection Inject 1 mL as directed every 30 (thirty) days. 01/18/19   [provider]  LOTEMAX SM 0.38 % GEL Place 1 drop into the right eye 3 (three) times daily. 03/17/19   [provider]  lubiprostone (AMITIZA) 24 MCG capsule Take 1 capsule (24 mcg total) by mouth 2 (two) times daily with a meal. Patient not taking: Reported on 01/31/2019 02/18/16   Golden Circle, FNP  omeprazole (PRILOSEC) 20 MG capsule  03/03/19   [provider]  oxymetazoline (AFRIN NASAL SPRAY) 0.05 % nasal spray Place 1 spray into both nostrils 2 (two) times  daily. Patient not taking: Reported on 01/31/2019 02/02/16   Delos Haring, PA-C  pantoprazole (PROTONIX) 40 MG tablet Take 40 mg by mouth daily. 01/11/19   [provider]  perphenazine (TRILAFON) 2 MG tablet Take 1 tablet (2 mg total) by mouth at bedtime. 02/03/19   Johnn Hai, MD  polyethylene glycol (MIRALAX / GLYCOLAX) 17 g packet Take 17 g by mouth daily. 01/24/19   Wieters, Hallie C, PA-C  PROLENSA 0.07 % SOLN Place 1 drop into the right eye daily. 03/17/19   [provider]  rizatriptan (MAXALT) 10 MG tablet Take 1 tablet by mouth at the onset of headache. May repeat in 2 hours if needed Patient not taking: Reported on 01/31/2019 01/02/16   Golden Circle, FNP  temazepam (RESTORIL) 15 MG capsule Take 1 capsule (15 mg total) by mouth at bedtime as needed for sleep. 02/03/19   Johnn Hai, MD  traMADol (ULTRAM) 50 MG tablet Take 1 tablet (50 mg total) by mouth every 6 (six) hours as needed. Patient not taking: Reported on 01/31/2019 01/06/16   Montine Circle, PA-C  traZODone (DESYREL) 50 MG tablet Take 50 mg by mouth at bedtime as needed for sleep. 01/11/19   [provider]    Family History Family History  Problem Relation Age of Onset  . Diabetes Mother   . Hypertension Mother   . Alcohol abuse Father     Social History Social History   Tobacco Use  . Smoking status: Never Smoker  . Smokeless tobacco: Never Used  Substance Use Topics  . Alcohol use: No  . Drug use: No     Allergies   Patient has no known allergies.   Review of Systems Review of Systems  Unable to perform ROS: Psychiatric disorder     Physical Exam Updated Vital Signs BP (!) 142/93   Pulse (!) 119   Temp 98.2 F (36.8 C) (Oral)   Resp 20   LMP 03/14/2016   SpO2 100%   Physical Exam Vitals signs and nursing note reviewed.  Constitutional:      Appearance: She is well-developed.     Comments: Adult female lying in the gurney, but yelling frequently, agitated,  combative.  HENT:     Head: Normocephalic and atraumatic.  Eyes:     Conjunctiva/sclera: Conjunctivae normal.  Pulmonary:     Effort: Pulmonary effort is normal. No respiratory distress.     Breath sounds: No stridor.  Abdominal:     General: There is no distension.  Skin:    General: Skin is warm and dry.  Neurological:     Mental Status: She is alert and oriented to person, place, and time.     Cranial Nerves: No cranial nerve deficit.  Psychiatric:  Mood and Affect: Mood is anxious. Affect is labile and angry.        Speech: Speech is rapid and pressured.        Behavior: Behavior is aggressive and hyperactive.        Thought Content: Thought content is delusional.      ED Treatments / Results  Labs (all labs ordered are listed, but only abnormal results are displayed) Labs Reviewed  COMPREHENSIVE METABOLIC PANEL  ETHANOL  SALICYLATE LEVEL  ACETAMINOPHEN LEVEL  CBC  RAPID URINE DRUG SCREEN, HOSP PERFORMED  I-STAT BETA HCG BLOOD, ED ([facility], [facility], [facility] ONLY)    EKG None  Radiology No results found.  Procedures Procedures (including critical care time)  Medications Ordered in ED Medications  benztropine (COGENTIN) tablet 0.5 mg (has no administration in time range)  divalproex (DEPAKOTE SPRINKLE) capsule 125 mg (has no administration in time range)  FLUoxetine (PROZAC) capsule 20 mg (has no administration in time range)  hydrochlorothiazide (MICROZIDE) capsule 12.5 mg (has no administration in time range)  perphenazine (TRILAFON) tablet 2 mg (has no administration in time range)  temazepam (RESTORIL) capsule 15 mg (has no administration in time range)  traZODone (DESYREL) tablet 50 mg (has no administration in time range)  ziprasidone (GEODON) injection 10 mg (has no administration in time range)    And  LORazepam (ATIVAN) tablet 1 mg (has no administration in time range)     Initial Impression / Assessment and Plan / ED Course  I have reviewed the triage  vital signs and the nursing notes.  Pertinent labs & imaging results that were available during my care of the patient were reviewed by me and considered in my medical decision making (see chart for details).  Adult female presents under involuntary commitment, with concern for worsening psychosis. Patient is awake, alert, denies complaints herself, is hemodynamically unremarkable aside from mild evidence for tachycardia. Patient will require labs, possibly sedation to facilitate psychiatric evaluation.  Labs notable only for mild hypokalemia, mild hyperglycemia otherwise generally unremarkable.  Final Clinical Impressions(s) / ED Diagnoses   Final diagnoses:  Agitation      Carmin Muskrat, MD 05/04/19 2355

## 2019-05-04 NOTE — ED Notes (Signed)
Pt cooperative with staff members and pleasant. Pt was dressed into maroon scrubs and all items placed in a pt's belongings bag and secured in cabinets behind nurse's desk. Pt provided with warm blankets and a ginger ale.

## 2019-05-04 NOTE — ED Triage Notes (Signed)
Per Police,  Pt was ringing on neighbors house multiple times today and appeared to be manic. Pt also threatening neighbor and police on arrival. Pts sister took out IVC paperwork on pt and wanted her to be checked out.

## 2019-05-05 ENCOUNTER — Ambulatory Visit: Payer: Medicare HMO | Admitting: Family Medicine

## 2019-05-05 DIAGNOSIS — F3161 Bipolar disorder, current episode mixed, mild: Secondary | ICD-10-CM | POA: Diagnosis not present

## 2019-05-05 LAB — RESPIRATORY PANEL BY RT PCR (FLU A&B, COVID)
Influenza A by PCR: NEGATIVE
Influenza B by PCR: NEGATIVE
SARS Coronavirus 2 by RT PCR: NEGATIVE

## 2019-05-05 MED ORDER — ZIPRASIDONE MESYLATE 20 MG IM SOLR
INTRAMUSCULAR | Status: AC
  Administered 2019-05-05: 20 mg via INTRAMUSCULAR
  Filled 2019-05-05: qty 20

## 2019-05-05 MED ORDER — STERILE WATER FOR INJECTION IJ SOLN
INTRAMUSCULAR | Status: AC
  Administered 2019-05-05: 1.5 mL
  Filled 2019-05-05: qty 10

## 2019-05-05 MED ORDER — LORAZEPAM 1 MG PO TABS: 1.0000 mg | ORAL_TABLET | ORAL | Status: DC | PRN

## 2019-05-05 MED ORDER — DIVALPROEX SODIUM 125 MG PO CSDR
250.0000 mg | DELAYED_RELEASE_CAPSULE | Freq: Two times a day (BID) | ORAL | Status: DC
  Administered 2019-05-05: 250 mg via ORAL
  Filled 2019-05-05 (×3): qty 2

## 2019-05-05 MED ORDER — FLUPHENAZINE HCL 2.5 MG/ML IJ SOLN
2.5000 mg | Freq: Four times a day (QID) | INTRAMUSCULAR | Status: DC | PRN
  Filled 2019-05-05: qty 1

## 2019-05-05 MED ORDER — ZIPRASIDONE MESYLATE 20 MG IM SOLR: 20.0000 mg | INTRAMUSCULAR | Status: DC | PRN

## 2019-05-05 MED ORDER — FLUPHENAZINE HCL 2.5 MG/ML IJ SOLN
2.5000 mg | INTRAMUSCULAR | Status: AC
  Administered 2019-05-05: 2.5 mg via INTRAMUSCULAR
  Filled 2019-05-05: qty 1

## 2019-05-05 MED ORDER — ZIPRASIDONE MESYLATE 20 MG IM SOLR: 20.0000 mg | Freq: Once | INTRAMUSCULAR | Status: AC

## 2019-05-05 MED ORDER — VITAMIN B-1 100 MG PO TABS: 100.0000 mg | ORAL_TABLET | Freq: Every day | ORAL | Status: DC

## 2019-05-05 MED ORDER — FOLIC ACID 1 MG PO TABS: 1.0000 mg | ORAL_TABLET | Freq: Every day | ORAL | Status: DC

## 2019-05-05 MED ORDER — FLUPHENAZINE HCL 5 MG PO TABS
5.0000 mg | ORAL_TABLET | Freq: Three times a day (TID) | ORAL | Status: DC
  Administered 2019-05-05: 5 mg via ORAL
  Filled 2019-05-05 (×5): qty 1

## 2019-05-05 MED ORDER — OLANZAPINE 10 MG PO TBDP: 10.0000 mg | ORAL_TABLET | Freq: Three times a day (TID) | ORAL | Status: DC | PRN

## 2019-05-05 MED ORDER — POTASSIUM CHLORIDE CRYS ER 20 MEQ PO TBCR
40.0000 meq | EXTENDED_RELEASE_TABLET | Freq: Once | ORAL | Status: AC
  Administered 2019-05-05: 40 meq via ORAL
  Filled 2019-05-05: qty 2

## 2019-05-05 NOTE — Progress Notes (Signed)
Patient ID: Melissa Dougherty, female   DOB: 1971/03/01, 48 y.o.   MRN: [id number] Consult service received a call from emergency room nursing.  Stating that the patient had become significantly agitated.  Review of her orders was done.  The patient had been given Trilafon, but this was stopped.  On her last discharge she had been placed on Prolixin twice daily.  We will start Prolixin 5 mg p.o. twice daily, and if the patient does not agree to oral medicine then we will give Prolixin 2.5 mg IM every 8 hours as needed agitation.  Additionally she is on Depakote sprinkles.  She is on 125 mg p.o. twice daily.  This will be increased to 250 mg p.o. twice daily.  Her Depakote level in the emergency department had not been obtained, and we will order that for the a.m. if she is still in the emergency room.  [facility]. 1.  Stop Trilafon 2.  Prolixin 5 mg p.o. every 8 hours first dose now for psychosis. 3.  If patient refuses oral Prolixin then may give injection of Prolixin 2.5 mg every 6 hours as needed agitation. 4.  Increase Depakote sprinkles to 250 mg p.o. every 12 hours for mood stability. 5.  Patient awaiting bed assignment at behavioral health hospital.

## 2019-05-05 NOTE — BH Assessment (Addendum)
[facility] Assessment Progress Note  Per Myles Lipps, MD, this pt requires psychiatric hospitalization at this time.  Pt presents under IVC initiated by pt's foster sister, and upheld by Dr Mallie Darting.  The following facilities have been contacted to seek placement for this pt, with results as noted:  Beds available, information sent, decision pending:  [facility]:  [facility] (due to pt and unit acuity)   At capacity:  [facility], [place] Coordinator [phone]

## 2019-05-05 NOTE — ED Notes (Signed)
Pt accepted to [facility]  Accepting [facility]  Report to be called after 8 am tomorrow \ [phone] Pt is involuntary sheriff needed for transport

## 2019-05-05 NOTE — BHH Counselor (Signed)
TTS contacted nurse to complete assessment, per nurse Secretary Debbie at [phone], pt is still not awake and unable to complete assessment. Pt was given Geodon at  2304. TTS will complete assessment once patient awakes. Thank you!

## 2019-05-05 NOTE — ED Notes (Signed)
Pt agitated, arguing, yelling, talking about leaving, limited insight.

## 2019-05-05 NOTE — ED Notes (Signed)
Pt to room 39. Pt calm, sleeping.

## 2019-05-05 NOTE — Consult Note (Signed)
Telepsych Consultation   Reason for Consult:  IVC, agitated Referring Physician:  EDP Location of Patient:  Location of Provider: [facility]  Patient Identification: Melissa Dougherty MRN:  [id number] Principal Diagnosis: Bipolar 1 disorder, mixed ([place]) Diagnosis:  Principal Problem:   Bipolar 1 disorder, mixed ([place])   Total Time spent with patient: 30 minutes  Subjective:   Melissa Dougherty is a 48 y.o. female patient admitted with involuntary commitment.  Patient reportedly irritating neighbors, combative disruptive and not taking home medications.  Patient appears agitated upon my assessment.  Patient assessed by nurse practitioner.  Observed with loud speech, rapid and pressured.  Patient agitated and labile, patient states "I will sue every fucking 1 of you."  Patient appears paranoid, states "that person is not my sister."  Patient refuses to participate in assessment.  Patient increasingly labile and threatening in emergency department.  Patient seen along with Dr. Mallie Darting who agrees with disposition for inpatient treatment.  HPI: Patient involuntarily committed, arrived to emergency department with [facility].  Past Psychiatric History: Bipolar disorder  Risk to Self:  Yes Risk to Others:  Yes Prior Inpatient Therapy:  Yes Prior Outpatient Therapy:  Yes  Past Medical History:  Past Medical History:  Diagnosis Date  . Alcohol abuse   . Allergy   . Bipolar disorder ([place])   . Depression   . Drug abuse ([place])   . GERD (gastroesophageal reflux disease)   . Heart murmur   . Seizures ([place])    Last seizure was in 1989    Past Surgical History:  Procedure Laterality Date  . BACK SURGERY    . TUBAL LIGATION     Family History:  Family History  Problem Relation Age of Onset  . Diabetes Mother   . Hypertension Mother   . Alcohol abuse Father    Family Psychiatric  History: Unknown Social History:  Social History   Substance and  Sexual Activity  Alcohol Use No     Social History   Substance and Sexual Activity  Drug Use No    Social History   Socioeconomic History  . Marital status: Single    Spouse name: Not on file  . Number of children: 2  . Years of education: 86  . Highest education level: Not on file  Occupational History  . Occupation: Disability    Comment: Epilepsy, Mental Health Issues  Tobacco Use  . Smoking status: Never Smoker  . Smokeless tobacco: Never Used  Substance and Sexual Activity  . Alcohol use: No  . Drug use: No  . Sexual activity: Not Currently    Birth control/protection: Surgical  Other Topics Concern  . Not on file  Social History Narrative   Fun: Go shopping   Denies abuse and feels safe at home.    Social Determinants of Health   Financial Resource Strain:   . Difficulty of Paying Living Expenses: Not on file  Food Insecurity:   . Worried About Charity fundraiser in the Last Year: Not on file  . Ran Out of Food in the Last Year: Not on file  Transportation Needs:   . Lack of Transportation (Medical): Not on file  . Lack of Transportation (Non-Medical): Not on file  Physical Activity:   . Days of Exercise per Week: Not on file  . Minutes of Exercise per Session: Not on file  Stress:   . Feeling of Stress : Not on file  Social Connections:   .  Frequency of Communication with Friends and Family: Not on file  . Frequency of Social Gatherings with Friends and Family: Not on file  . Attends Religious Services: Not on file  . Active Member of Clubs or Organizations: Not on file  . Attends Archivist Meetings: Not on file  . Marital Status: Not on file   Additional Social History:    Allergies:  No Known Allergies  Labs:  Results for orders placed or performed during the hospital encounter of 05/04/19 (from the past 48 hour(s))  Comprehensive metabolic panel     Status: Abnormal   Collection Time: 05/04/19  8:58 PM  Result Value Ref Range    Sodium 135 135 - 145 mmol/L   Potassium 2.9 (L) 3.5 - 5.1 mmol/L   Chloride 100 98 - 111 mmol/L   CO2 22 22 - 32 mmol/L   Glucose, Bld 168 (H) 70 - 99 mg/dL   BUN 6 6 - 20 mg/dL   Creatinine, Ser 1.01 (H) 0.44 - 1.00 mg/dL   Calcium 8.6 (L) 8.9 - 10.3 mg/dL   Total Protein 7.4 6.5 - 8.1 g/dL   Albumin 3.4 (L) 3.5 - 5.0 g/dL   AST 30 15 - 41 U/L   ALT 21 0 - 44 U/L   Alkaline Phosphatase 102 38 - 126 U/L   Total Bilirubin 0.9 0.3 - 1.2 mg/dL   GFR calc non Af Amer >60 >60 mL/min   GFR calc Af Amer >60 >60 mL/min   Anion gap 13 5 - 15    Comment: Performed at [facility], [place] [address], [place], [place] [address]  Ethanol     Status: None   Collection Time: 05/04/19  8:58 PM  Result Value Ref Range   Alcohol, Ethyl (B) <10 <10 mg/dL    Comment: (NOTE) Lowest detectable limit for serum alcohol is 10 mg/dL. For medical purposes only. Performed at [facility], [place] [address] [place], [place] [id number]   Salicylate level     Status: None   Collection Time: 05/04/19  8:58 PM  Result Value Ref Range   Salicylate Lvl [id number] 2.8 - 30.0 mg/dL    Comment: Performed at [facility], [place] [address] [place], [place] [address]  Acetaminophen level     Status: Abnormal   Collection Time: 05/04/19  8:58 PM  Result Value Ref Range   Acetaminophen (Tylenol), Serum <10 (L) 10 - 30 ug/mL    Comment: (NOTE) Therapeutic concentrations vary significantly. A range of 10-30 ug/mL  may be an effective concentration for many patients. However, some  are best treated at concentrations outside of this range. Acetaminophen concentrations >150 ug/mL at 4 hours after ingestion  and >50 ug/mL at 12 hours after ingestion are often associated with  toxic reactions. Performed at [facility], [place] [address] [place], [place] [address]   cbc     Status: Abnormal   Collection Time: 05/04/19  8:58 PM  Result Value Ref Range   WBC  9.5 4.0 - 10.5 K/uL   RBC 5.37 (H) 3.87 - 5.11 MIL/uL   Hemoglobin 11.7 (L) 12.0 - 15.0 g/dL   HCT 37.1 36.0 - 46.0 %   MCV 69.1 (L) 80.0 - 100.0 fL   MCH 21.8 (L) 26.0 - 34.0 pg   MCHC 31.5 30.0 - 36.0 g/dL   RDW 19.1 (H) 11.5 - 15.5 %   Platelets 205 150 - 400 K/uL   nRBC  0.0 0.0 - 0.2 %    Comment: Performed at [facility], [place] [address], [place], [place] [address]  Rapid urine drug screen (hospital performed)     Status: None   Collection Time: 05/04/19  8:58 PM  Result Value Ref Range   Opiates NONE DETECTED NONE DETECTED   Cocaine NONE DETECTED NONE DETECTED   Benzodiazepines NONE DETECTED NONE DETECTED   Amphetamines NONE DETECTED NONE DETECTED   Tetrahydrocannabinol NONE DETECTED NONE DETECTED   Barbiturates NONE DETECTED NONE DETECTED    Comment: (NOTE) DRUG SCREEN FOR MEDICAL PURPOSES ONLY.  IF CONFIRMATION IS NEEDED FOR ANY PURPOSE, NOTIFY LAB WITHIN 5 DAYS. LOWEST DETECTABLE LIMITS FOR URINE DRUG SCREEN Drug Class                     Cutoff (ng/mL) Amphetamine and metabolites    1000 Barbiturate and metabolites    200 Benzodiazepine                 [id number] Tricyclics and metabolites     300 Opiates and metabolites        300 Cocaine and metabolites        300 THC                            50 Performed at [facility], [place] [address] [place], [place] [address]   I-Stat beta hCG blood, ED     Status: None   Collection Time: 05/04/19  9:31 PM  Result Value Ref Range   I-stat hCG, quantitative <5.0 <5 mIU/mL   Comment 3            Comment:   GEST. AGE      CONC.  (mIU/mL)   <=1 WEEK        5 - 50     2 WEEKS       50 - 500     3 WEEKS       100 - 10,000     4 WEEKS     1,000 - 30,000        FEMALE AND NON-PREGNANT FEMALE:     LESS THAN 5 mIU/mL   Respiratory Panel by RT PCR (Flu A&B, Covid) - Nasopharyngeal Swab     Status: None   Collection Time: 05/05/19  9:19 AM   Specimen: Nasopharyngeal Swab  Result Value Ref  Range   SARS Coronavirus 2 by RT PCR NEGATIVE NEGATIVE    Comment: (NOTE) SARS-CoV-2 target nucleic acids are NOT DETECTED. The SARS-CoV-2 RNA is generally detectable in upper respiratoy specimens during the acute phase of infection. The lowest concentration of SARS-CoV-2 viral copies this assay can detect is 131 copies/mL. A negative result does not preclude SARS-Cov-2 infection and should not be used as the sole basis for treatment or other patient management decisions. A negative result may occur with  improper specimen collection/handling, submission of specimen other than nasopharyngeal swab, presence of viral mutation(s) within the areas targeted by this assay, and inadequate number of viral copies (<131 copies/mL). A negative result must be combined with clinical observations, patient history, and epidemiological information. The expected result is Negative. Fact Sheet for Patients:  PinkCheek.be Fact Sheet for Healthcare Providers:  GravelBags.it This test is not yet ap proved or cleared by the [place] FDA and  has been authorized for detection and/or diagnosis of SARS-CoV-2 by FDA under an Emergency Use  Authorization (EUA). This EUA will remain  in effect (meaning this test can be used) for the duration of the COVID-19 declaration under Section 564(b)(1) of the Act, 21 U.S.C. section 360bbb-3(b)(1), unless the authorization is terminated or revoked sooner.    Influenza A by PCR NEGATIVE NEGATIVE   Influenza B by PCR NEGATIVE NEGATIVE    Comment: (NOTE) The Xpert Xpress SARS-CoV-2/FLU/RSV assay is intended as an aid in  the diagnosis of influenza from Nasopharyngeal swab specimens and  should not be used as a sole basis for treatment. Nasal washings and  aspirates are unacceptable for Xpert Xpress SARS-CoV-2/FLU/RSV  testing. Fact Sheet for Patients: PinkCheek.be Fact Sheet for  Healthcare Providers: GravelBags.it This test is not yet approved or cleared by the [place] FDA and  has been authorized for detection and/or diagnosis of SARS-CoV-2 by  FDA under an Emergency Use Authorization (EUA). This EUA will remain  in effect (meaning this test can be used) for the duration of the  Covid-19 declaration under Section 564(b)(1) of the Act, 21  U.S.C. section 360bbb-3(b)(1), unless the authorization is  terminated or revoked. Performed at [facility], [place] [address] [place], [place] [address]     Medications:  Current Facility-Administered Medications  Medication Dose Route Frequency Provider Last Rate Last Admin  . benztropine (COGENTIN) tablet 0.5 mg  0.5 mg Oral QHS Carmin Muskrat, MD   0.5 mg at 05/04/19 2241  . divalproex (DEPAKOTE SPRINKLE) capsule 250 mg  250 mg Oral Q12H Sharma Covert, MD      . FLUoxetine (PROZAC) capsule 20 mg  20 mg Oral Daily Carmin Muskrat, MD   20 mg at 05/05/19 1134  . fluPHENAZine (PROLIXIN) injection 2.5 mg  2.5 mg Intramuscular Q6H PRN Sharma Covert, MD      . fluPHENAZine (PROLIXIN) tablet 5 mg  5 mg Oral Q8H Clary, Cordie Grice, MD      . folic acid (FOLVITE) tablet 1 mg  1 mg Oral Daily Sharma Covert, MD      . hydrochlorothiazide (MICROZIDE) capsule 12.5 mg  12.5 mg Oral Daily Carmin Muskrat, MD   12.5 mg at 05/05/19 1134  . OLANZapine zydis (ZYPREXA) disintegrating tablet 10 mg  10 mg Oral Q8H PRN Sharma Covert, MD       And  . LORazepam (ATIVAN) tablet 1 mg  1 mg Oral PRN Sharma Covert, MD       And  . ziprasidone (GEODON) injection 20 mg  20 mg Intramuscular PRN Sharma Covert, MD      . temazepam (RESTORIL) capsule 15 mg  15 mg Oral QHS PRN Carmin Muskrat, MD      . thiamine (VITAMIN B-1) tablet 100 mg  100 mg Oral Daily Sharma Covert, MD      . traZODone (DESYREL) tablet 50 mg  50 mg Oral QHS PRN Carmin Muskrat, MD        Current Outpatient Medications  Medication Sig Dispense Refill  . amoxicillin (AMOXIL) 500 MG capsule     . benztropine (COGENTIN) 0.5 MG tablet Take 0.5 mg by mouth at bedtime.    Marland Kitchen BESIVANCE 0.6 % SUSP Place 1 drop into the right eye 2 (two) times daily.    . clarithromycin (BIAXIN) 500 MG tablet     . divalproex (DEPAKOTE SPRINKLE) 125 MG capsule Take 1 capsule (125 mg total) by mouth every 12 (twelve) hours. 60 capsule 1  . FLUoxetine (PROZAC) 20 MG capsule Take  1 capsule (20 mg total) by mouth daily. 90 capsule 1  . hydrochlorothiazide (MICROZIDE) 12.5 MG capsule Take 12.5 mg by mouth daily.    Lorayne Bender SUSTENNA 234 MG/1.5ML SUSY injection Inject 1.5 mLs as directed every 30 (thirty) days.     Marland Kitchen LOTEMAX SM 0.38 % GEL Place 1 drop into the right eye 3 (three) times daily.    Marland Kitchen lubiprostone (AMITIZA) 24 MCG capsule Take 1 capsule (24 mcg total) by mouth 2 (two) times daily with a meal. (Patient not taking: Reported on 01/31/2019) 60 capsule 0  . omeprazole (PRILOSEC) 20 MG capsule Take 20 mg by mouth 2 (two) times daily before a meal.     . oxymetazoline (AFRIN NASAL SPRAY) 0.05 % nasal spray Place 1 spray into both nostrils 2 (two) times daily. (Patient not taking: Reported on 01/31/2019) 30 mL 0  . pantoprazole (PROTONIX) 40 MG tablet Take 40 mg by mouth daily.    Marland Kitchen perphenazine (TRILAFON) 2 MG tablet Take 1 tablet (2 mg total) by mouth at bedtime. 30 tablet 2  . polyethylene glycol (MIRALAX / GLYCOLAX) 17 g packet Take 17 g by mouth daily. 14 each 0  . PROLENSA 0.07 % SOLN Place 1 drop into the right eye daily.    . rizatriptan (MAXALT) 10 MG tablet Take 1 tablet by mouth at the onset of headache. May repeat in 2 hours if needed (Patient not taking: Reported on 01/31/2019) 12 tablet 0  . temazepam (RESTORIL) 15 MG capsule Take 1 capsule (15 mg total) by mouth at bedtime as needed for sleep. 30 capsule 1  . traMADol (ULTRAM) 50 MG tablet Take 1 tablet (50 mg total) by mouth every 6 (six) hours  as needed. (Patient not taking: Reported on 01/31/2019) 7 tablet 0  . traZODone (DESYREL) 50 MG tablet Take 50 mg by mouth at bedtime.      Musculoskeletal: Strength & Muscle Tone: within normal limits Gait & Station: normal Patient leans: N/A  Psychiatric Specialty Exam: Physical Exam  Nursing note and vitals reviewed. Constitutional: She is oriented to person, place, and time. She appears well-developed.  HENT:  Head: Normocephalic.  Cardiovascular: Normal rate.  Respiratory: Effort normal.  Neurological: She is alert and oriented to person, place, and time.  Psychiatric: Her affect is labile. Her speech is rapid and/or pressured and tangential. She is agitated. Thought content is paranoid. Cognition and memory are impaired.    Review of Systems  Constitutional: Negative.   HENT: Negative.   Eyes: Negative.   Respiratory: Negative.   Cardiovascular: Negative.   Gastrointestinal: Negative.   Genitourinary: Negative.   Musculoskeletal: Negative.   Skin: Negative.   Neurological: Negative.   Psychiatric/Behavioral: Positive for agitation. The patient is hyperactive.     Blood pressure 130/83, pulse (!) 102, temperature 97.8 F (36.6 C), temperature source Oral, resp. rate 17, last menstrual period 03/14/2016, SpO2 100 %.There is no height or weight on file to calculate BMI.  General Appearance: Disheveled  Eye Contact:  Minimal  Speech:  Pressured  Volume:  Increased  Mood:  Anxious and Irritable  Affect:  Labile  Thought Process:  Disorganized and Descriptions of Associations: Tangential  Orientation:  Full (Time, Place, and Person)  Thought Content:  Paranoid Ideation and Tangential  Suicidal Thoughts:  No  Homicidal Thoughts:  No  Memory:  Immediate;   Poor Recent;   Poor Remote;   Poor  Judgement:  Impaired  Insight:  Lacking  Psychomotor Activity:  Normal  Concentration:  Concentration: Poor  Recall:  Poor  Fund of Knowledge:  Fair  Language:  Fair   Akathisia:  No  Handed:  Right  AIMS (if indicated):     Assets:  Agricultural consultant Housing Physical Health Social Support  ADL's:  Intact  Cognition:  WNL  Sleep:        Treatment Plan Summary: Daily contact with patient to assess and evaluate symptoms and progress in treatment  Disposition: Recommend psychiatric Inpatient admission when medically cleared. Supportive therapy provided about ongoing stressors.  This service was provided via telemedicine using a 2-way, interactive audio and video technology.  Names of all persons participating in this telemedicine service and their role in this encounter. Name: Jerold Coombe Role: Patient  Name: Letitia Libra Role: [place], [place] 05/05/2019 3:09 PM

## 2019-05-05 NOTE — ED Notes (Signed)
Patient resting comfortably. Sitter at bedside. Patient refusing vital signs.

## 2019-05-05 NOTE — BH Assessment (Addendum)
Per Jonelle Sidle, Patient accepted to [facility] (Adult unit) after 8am. Patient to present to their main campus upon arrival.The accepting provider is Jonelle Sports, MD. Call report number [phone]. Marland Kitchen

## 2019-05-05 NOTE — BH Assessment (Signed)
12/10-Melissa Hall Busing, NP, recommends inpatient admissions. Patient referred to the following hospitals for consideration:    [facility] Details    [facility] Details    [facility] Details    [facility]    [facility] Details    [facility] Details    [facility] Details    [facility] Details    [facility] Details    [facility] Details    [facility] Details    [facility] Details    [facility] Details    [facility] Details    [place] Details    [facility] Details    [facility] Details    [facility] Details    [facility]

## 2019-05-06 DIAGNOSIS — I10 Essential (primary) hypertension: Secondary | ICD-10-CM | POA: Diagnosis not present

## 2019-05-06 DIAGNOSIS — K219 Gastro-esophageal reflux disease without esophagitis: Secondary | ICD-10-CM | POA: Diagnosis not present

## 2019-05-06 DIAGNOSIS — Z9114 Patient's other noncompliance with medication regimen: Secondary | ICD-10-CM | POA: Diagnosis not present

## 2019-05-06 DIAGNOSIS — E119 Type 2 diabetes mellitus without complications: Secondary | ICD-10-CM | POA: Diagnosis not present

## 2019-05-06 DIAGNOSIS — F39 Unspecified mood [affective] disorder: Secondary | ICD-10-CM | POA: Diagnosis not present

## 2019-05-06 DIAGNOSIS — F29 Unspecified psychosis not due to a substance or known physiological condition: Secondary | ICD-10-CM | POA: Diagnosis not present

## 2019-05-06 DIAGNOSIS — R45850 Homicidal ideations: Secondary | ICD-10-CM | POA: Diagnosis not present

## 2019-05-06 NOTE — ED Notes (Signed)
[facility] [employer] notified to transport patient . Sheriff stated it would be a while.

## 2019-05-06 NOTE — ED Notes (Signed)
Report called to [place] at [facility]

## 2019-05-06 NOTE — ED Notes (Signed)
[facility] [employer] called to notify they are on the way to get patient.

## 2019-05-31 ENCOUNTER — Encounter (HOSPITAL_COMMUNITY): Payer: Self-pay

## 2019-05-31 ENCOUNTER — Ambulatory Visit (HOSPITAL_COMMUNITY)
Admission: EM | Admit: 2019-05-31 | Discharge: 2019-05-31 | Disposition: A | Discharge status: Home / Self Care | Payer: Medicare HMO | Attending: Family Medicine | Admitting: Family Medicine

## 2019-05-31 ENCOUNTER — Other Ambulatory Visit: Payer: Self-pay

## 2019-05-31 DIAGNOSIS — N951 Menopausal and female climacteric states: Secondary | ICD-10-CM | POA: Diagnosis not present

## 2019-05-31 DIAGNOSIS — N939 Abnormal uterine and vaginal bleeding, unspecified: Secondary | ICD-10-CM | POA: Insufficient documentation

## 2019-05-31 DIAGNOSIS — R319 Hematuria, unspecified: Secondary | ICD-10-CM | POA: Insufficient documentation

## 2019-05-31 DIAGNOSIS — Z3202 Encounter for pregnancy test, result negative: Secondary | ICD-10-CM | POA: Diagnosis not present

## 2019-05-31 HISTORY — DX: Essential (primary) hypertension: I10

## 2019-05-31 LAB — POCT URINALYSIS DIP (DEVICE)
Bilirubin Urine: NEGATIVE
Glucose, UA: NEGATIVE mg/dL
Ketones, ur: NEGATIVE mg/dL
Nitrite: NEGATIVE
Protein, ur: NEGATIVE mg/dL
Specific Gravity, Urine: <=1.005 (ref 1.005–1.030)
Urobilinogen, UA: 0.2 mg/dL (ref 0.0–1.0)
pH: 6.5 (ref 5.0–8.0)

## 2019-05-31 LAB — POCT PREGNANCY, URINE: Preg Test, Ur: NEGATIVE

## 2019-05-31 LAB — POC URINE PREG, ED: Preg Test, Ur: NEGATIVE

## 2019-05-31 MED ORDER — NITROFURANTOIN MONOHYD MACRO 100 MG PO CAPS: 100.0000 mg | ORAL_CAPSULE | Freq: Two times a day (BID) | ORAL | 0 refills | Status: DC

## 2019-05-31 MED ORDER — NAPROXEN 500 MG PO TABS: 500.0000 mg | ORAL_TABLET | Freq: Two times a day (BID) | ORAL | 0 refills | Status: DC

## 2019-05-31 NOTE — ED Triage Notes (Signed)
Pt c/o heavy vaginal bleeding since 05/07/19; states hematuria and abd pain with urination. States vomiting, and edema to bilat feet.   Discharged from [facility] on 05/07/19.  Had Covid tests (negative) 05/05/19 and in September.   Pt states medication regimen has recently changed but unable to accurately disclose which medications she is taking and which has been discontinued.  Requesting "fluid pill" for swelling/pain in bilat feet.

## 2019-05-31 NOTE — ED Provider Notes (Signed)
[facility]    CSN: [id number] Arrival date & time: 05/31/19  1816      History   Chief Complaint Chief Complaint  Patient presents with  . Vaginal Bleeding  . Abdominal Pain    HPI Melissa Dougherty is a 49 y.o. female.   HPI Here with multiple complaints - vaginal bleeding and pelvic cramping, 4 pads a day, "going through change" , denies STD exposure or sexual activity, since 05/07/2019. Cannot tell me when she got out of hospital.  Fired her old PCP and cannot get in with new until mid Feb.  No lightheadedness or orthostatic sx - Hematuria with frequency and dysuria, no flank pain or fevers - vomiting and weight loss for "months" - swelling in ankles, requests a fluid pill.  Hist. hypokalemia in hospital of 2.9, while on HCTZ and this was stopped -cannot accurately tell me her current medications   Past Medical History:  Diagnosis Date  . Alcohol abuse   . Allergy   . Bipolar disorder ([place])   . Depression   . Drug abuse ([place])   . GERD (gastroesophageal reflux disease)   . Heart murmur   . Hypertension   . Seizures ([place])    Last seizure was in 1989    Patient Active Problem List   Diagnosis Date Noted  . Bipolar 1 disorder, depressed, severe ([place]) 01/31/2019  . Sensorineural hearing loss (SNHL) of both ears 08/09/2018  . Increased storage iron 02/18/2016  . Neuropathy 02/18/2016  . Otitis media 02/18/2016  . Fatigue 02/07/2016  . Constipation 02/07/2016  . Acute upper respiratory infection 01/25/2016  . Headache 12/25/2015  . Routine general medical examination at a health care facility 11/01/2015  . Medicare welcome exam 11/01/2015  . Abdominal bloating 10/16/2015  . Bipolar 1 disorder, mixed ([place]) 01/10/2014  . Bipolar I disorder, most recent episode (or current) mixed, moderate 12/24/2013  . Adjustment disorder with mixed anxiety and depressed mood 12/23/2013  . MDD (major depressive disorder) 12/23/2013  . Depressive disorder 12/22/2013  .  Papanicolaou smear of cervix with atypical squamous cells of undetermined significance (ASC-US) 10/12/2013  . DEPRESSION 02/17/2007  . GERD 02/17/2007  . SEIZURE DISORDER, HX OF 02/17/2007    Past Surgical History:  Procedure Laterality Date  . BACK SURGERY    . TUBAL LIGATION      OB History    Gravida  2   Para  2   Term  2   Preterm      AB      Living  2     SAB      TAB      Ectopic      Multiple      Live Births  2            Home Medications    Prior to Admission medications   Medication Sig Start Date End Date Taking? Authorizing Provider  FLUoxetine (PROZAC) 20 MG capsule Take 1 capsule (20 mg total) by mouth daily. 02/04/19   Johnn Hai, MD  INVEGA SUSTENNA 234 MG/1.5ML SUSY injection Inject 1.5 mLs as directed every 30 (thirty) days.  01/18/19   [provider]  LOTEMAX SM 0.38 % GEL Place 1 drop into the right eye 3 (three) times daily. 03/17/19   [provider]  naproxen (NAPROSYN) 500 MG tablet Take 1 tablet (500 mg total) by mouth 2 (two) times daily. 05/31/19   Raylene Everts, MD  nitrofurantoin, macrocrystal-monohydrate, (MACROBID) 100  MG capsule Take 1 capsule (100 mg total) by mouth 2 (two) times daily. 05/31/19   Raylene Everts, MD  pantoprazole (PROTONIX) 40 MG tablet Take 40 mg by mouth daily. 01/11/19   [provider]  perphenazine (TRILAFON) 2 MG tablet Take 1 tablet (2 mg total) by mouth at bedtime. 02/03/19   Johnn Hai, MD  polyethylene glycol (MIRALAX / GLYCOLAX) 17 g packet Take 17 g by mouth daily. 01/24/19   Wieters, Hallie C, PA-C  PROLENSA 0.07 % SOLN Place 1 drop into the right eye daily. 03/17/19   [provider]  temazepam (RESTORIL) 15 MG capsule Take 1 capsule (15 mg total) by mouth at bedtime as needed for sleep. 02/03/19   Johnn Hai, MD  traZODone (DESYREL) 50 MG tablet Take 50 mg by mouth at bedtime.    [provider]  benztropine (COGENTIN) 0.5 MG tablet Take 0.5 mg  by mouth at bedtime. 01/17/19 05/31/19  [provider]  divalproex (DEPAKOTE SPRINKLE) 125 MG capsule Take 1 capsule (125 mg total) by mouth every 12 (twelve) hours. 02/03/19 05/31/19  Johnn Hai, MD  hydrochlorothiazide (MICROZIDE) 12.5 MG capsule Take 12.5 mg by mouth daily. 01/26/19 05/31/19  [provider]  omeprazole (PRILOSEC) 20 MG capsule Take 20 mg by mouth 2 (two) times daily before a meal.  03/03/19 05/31/19  [provider]  rizatriptan (MAXALT) 10 MG tablet Take 1 tablet by mouth at the onset of headache. May repeat in 2 hours if needed Patient not taking: Reported on 01/31/2019 01/02/16 05/31/19  Golden Circle, FNP    Family History Family History  Problem Relation Age of Onset  . Diabetes Mother   . Hypertension Mother   . Alcohol abuse Father     Social History Social History   Tobacco Use  . Smoking status: Never Smoker  . Smokeless tobacco: Never Used  Substance Use Topics  . Alcohol use: No  . Drug use: No     Allergies   Patient has no known allergies.   Review of Systems Review of Systems  Constitutional: Positive for activity change, appetite change and unexpected weight change. Negative for chills and fever.  HENT: Negative for congestion and hearing loss.   Eyes: Negative for pain.  Respiratory: Negative for cough and shortness of breath.   Cardiovascular: Negative for chest pain and leg swelling.  Gastrointestinal: Positive for nausea and vomiting. Negative for abdominal pain, constipation and diarrhea.  Genitourinary: Positive for dysuria, frequency, hematuria, menstrual problem, pelvic pain and vaginal bleeding. Negative for vaginal discharge.  Musculoskeletal: Negative for myalgias.  Neurological: Negative for dizziness, seizures and headaches.  Psychiatric/Behavioral: Positive for behavioral problems. The patient is not nervous/anxious.      Physical Exam Triage Vital Signs ED Triage Vitals  Enc Vitals Group     BP  05/31/19 1947 129/81     Pulse Rate 05/31/19 1947 96     Resp 05/31/19 1947 18     Temp 05/31/19 1947 98.5 F (36.9 C)     Temp Source 05/31/19 1947 Oral     SpO2 05/31/19 1947 99 %     Weight --      Height --      Head Circumference --      Peak Flow --      Pain Score 05/31/19 1941 9     Pain Loc --      Pain Edu? --      Excl. in [place]? --  No data found.  Updated Vital Signs BP 129/81 (BP Location: Right Arm)   Pulse 96   Temp 98.5 F (36.9 C) (Oral)   Resp 18   LMP 05/07/2019   SpO2 99%      Physical Exam Constitutional:      General: She is not in acute distress.    Appearance: She is well-developed.     Comments: Patient is overweight  HENT:     Head: Normocephalic and atraumatic.     Mouth/Throat:     Comments: Mask is in place Eyes:     Conjunctiva/sclera: Conjunctivae normal.     Pupils: Pupils are equal, round, and reactive to light.  Cardiovascular:     Rate and Rhythm: Normal rate and regular rhythm.     Heart sounds: Normal heart sounds.  Pulmonary:     Effort: Pulmonary effort is normal. No respiratory distress.     Breath sounds: Normal breath sounds.  Abdominal:     General: There is no distension.     Palpations: Abdomen is soft.     Comments: Protuberant, doughy abdomen.  Very large midline scar from xiphoid to pubis (from childhood).  No organomegaly or tenderness  Musculoskeletal:        General: Normal range of motion.     Cervical back: Normal range of motion and neck supple.  Skin:    General: Skin is warm and dry.     Comments: Patient has good capillary refill.  Normal conjunctivo-.  No evidence of anemia on physical exam  Neurological:     General: No focal deficit present.     Mental Status: She is alert.  Psychiatric:     Comments: Poor judgment.  Poor memory.  Poor medical sophistication.  Loud vocal tone.  Inappropriate affect, at times laughing.      UC Treatments / Results  Labs (all labs ordered are listed, but only  abnormal results are displayed) Labs Reviewed  POCT URINALYSIS DIP (DEVICE) - Abnormal; Notable for the following components:      Result Value   Hgb urine dipstick LARGE (*)    Leukocytes,Ua TRACE (*)    All other components within normal limits  URINE CULTURE  POCT PREGNANCY, URINE  POC URINE PREG, ED    EKG   Radiology No results found.  Procedures Procedures (including critical care time)  Medications Ordered in UC Medications - No data to display  Initial Impression / Assessment and Plan / UC Course  I have reviewed the triage vital signs and the nursing notes.  Pertinent labs & imaging results that were available during my care of the patient were reviewed by me and considered in my medical decision making (see chart for details).     I explained to the patient that I was unable to work-up her chronic vomiting and weight loss.  This is not a good form for this at the urgent care center.  I also told her that I was unable to place her back on a fluid pill.  With her last potassium of 2.9, and it simply not safe to do without lab work.  She is here late in the evening, and is seen after hours, lab work is not possible.  She is advised she can go to the emergency room if she wishes this, but there is a very long wait.  I recommend that she wait until she see her primary care doctor.  I did remind her that she does not  have any edema today. Final Clinical Impressions(s) / UC Diagnoses   Final diagnoses:  Vaginal bleeding  Perimenopausal  Hematuria, unspecified type     Discharge Instructions     Take naproxen 2 x a day with food This will reduce pain and bleeding Take the antibiotic 2 x a day This is for the blood in urine Follow up with a PCP   ED Prescriptions    Medication Sig Dispense Auth. Provider   naproxen (NAPROSYN) 500 MG tablet Take 1 tablet (500 mg total) by mouth 2 (two) times daily. 30 tablet Raylene Everts, MD   nitrofurantoin,  macrocrystal-monohydrate, (MACROBID) 100 MG capsule Take 1 capsule (100 mg total) by mouth 2 (two) times daily. 10 capsule Raylene Everts, MD     PDMP not reviewed this encounter.   Raylene Everts, MD 05/31/19 2211

## 2019-05-31 NOTE — Discharge Instructions (Addendum)
Take naproxen 2 x a day with food This will reduce pain and bleeding Take the antibiotic 2 x a day This is for the blood in urine Follow up with a PCP

## 2019-06-01 LAB — URINE CULTURE

## 2019-06-07 DIAGNOSIS — I10 Essential (primary) hypertension: Secondary | ICD-10-CM | POA: Diagnosis not present

## 2019-06-07 DIAGNOSIS — F341 Dysthymic disorder: Secondary | ICD-10-CM | POA: Diagnosis not present

## 2019-06-07 DIAGNOSIS — E119 Type 2 diabetes mellitus without complications: Secondary | ICD-10-CM | POA: Diagnosis not present

## 2019-06-07 DIAGNOSIS — F319 Bipolar disorder, unspecified: Secondary | ICD-10-CM | POA: Diagnosis not present

## 2019-06-07 DIAGNOSIS — K222 Esophageal obstruction: Secondary | ICD-10-CM | POA: Diagnosis not present

## 2019-06-07 DIAGNOSIS — E785 Hyperlipidemia, unspecified: Secondary | ICD-10-CM | POA: Diagnosis not present

## 2019-06-09 DIAGNOSIS — I10 Essential (primary) hypertension: Secondary | ICD-10-CM | POA: Diagnosis not present

## 2019-06-09 DIAGNOSIS — E785 Hyperlipidemia, unspecified: Secondary | ICD-10-CM | POA: Diagnosis not present

## 2019-06-09 DIAGNOSIS — E119 Type 2 diabetes mellitus without complications: Secondary | ICD-10-CM | POA: Diagnosis not present

## 2019-06-16 ENCOUNTER — Encounter: Payer: Self-pay | Admitting: Family Medicine

## 2019-06-16 ENCOUNTER — Other Ambulatory Visit (HOSPITAL_COMMUNITY)
Admission: RE | Admit: 2019-06-16 | Discharge: 2019-06-16 | Disposition: A | Discharge status: Home / Self Care | Payer: Medicare HMO | Source: Ambulatory Visit | Attending: Family Medicine | Admitting: Family Medicine

## 2019-06-16 ENCOUNTER — Ambulatory Visit (INDEPENDENT_AMBULATORY_CARE_PROVIDER_SITE_OTHER): Payer: Medicare HMO | Admitting: Family Medicine

## 2019-06-16 ENCOUNTER — Other Ambulatory Visit: Payer: Self-pay

## 2019-06-16 VITALS — BP 128/88 | HR 82 | Wt 156.0 lb

## 2019-06-16 DIAGNOSIS — Z1151 Encounter for screening for human papillomavirus (HPV): Secondary | ICD-10-CM | POA: Insufficient documentation

## 2019-06-16 DIAGNOSIS — Z124 Encounter for screening for malignant neoplasm of cervix: Secondary | ICD-10-CM | POA: Insufficient documentation

## 2019-06-16 DIAGNOSIS — N840 Polyp of corpus uteri: Secondary | ICD-10-CM

## 2019-06-16 DIAGNOSIS — N921 Excessive and frequent menstruation with irregular cycle: Secondary | ICD-10-CM

## 2019-06-16 DIAGNOSIS — Z1231 Encounter for screening mammogram for malignant neoplasm of breast: Secondary | ICD-10-CM | POA: Diagnosis not present

## 2019-06-16 LAB — POCT PREGNANCY, URINE: Preg Test, Ur: NEGATIVE

## 2019-06-16 NOTE — Progress Notes (Signed)
Abdominal pains/ Started since hospital over christmas break, was having some vaginal bleeding, was throwing up and having pains. States she was in the hospital over a nervous break down.  Last pap 2017 normal  No abnormal discharge Frequent Urination

## 2019-06-16 NOTE — Patient Instructions (Signed)

## 2019-06-16 NOTE — Progress Notes (Signed)
Subjective:    Patient ID: Melissa Dougherty is a 49 y.o. female presenting with No chief complaint on file.  on 06/16/2019  HPI: Had a nervous breakdown over the holidays and noted to have heavier cycles. She is concerned she is going through the changes. Notes her stomach feels big. No pap smear since 2017. Maybe has had no cycle since age 20. Thinks it has been 1 year without a cycle. Denies hot flashes, but then having cold chills. Poor appetite.  Review of Systems  Constitutional: Positive for activity change and chills. Negative for fever.  Respiratory: Negative for shortness of breath.   Cardiovascular: Negative for chest pain.  Gastrointestinal: Negative for abdominal pain, nausea and vomiting.  Genitourinary: Negative for dysuria.  Skin: Negative for rash.      Objective:    BP 128/88    Pulse 82    Wt 156 lb (70.8 kg)    BMI 29.48 kg/m  Physical Exam Exam conducted with a chaperone present.  Constitutional:      General: She is not in acute distress.    Appearance: She is well-developed.  HENT:     Head: Normocephalic and atraumatic.  Eyes:     General: No scleral icterus. Cardiovascular:     Rate and Rhythm: Normal rate and regular rhythm.     Heart sounds: No murmur.  Pulmonary:     Effort: Pulmonary effort is normal.     Breath sounds: Normal breath sounds.  Chest:     Breasts:        Right: Normal.        Left: Normal.  Abdominal:     Palpations: Abdomen is soft.  Genitourinary:    Labia:        Right: No lesion.        Left: No lesion.      Vagina: Normal.     Cervix: Dilated. Lesion (3 x 2 cm on stalk) present.  Musculoskeletal:     Cervical back: Neck supple.  Skin:    General: Skin is warm and dry.  Neurological:     Mental Status: She is alert and oriented to person, place, and time.    Procedure: Cervix visualized and polyp noted.  After pap smear obtained.  Ring forcep applied to cervix and twisting motion removed polyp intact.   Hemostasis obtained with Monsel's solution.  Procedure: Patient given informed consent, signed copy in the chart, time out was performed. Appropriate time out taken. . The patient was placed in the lithotomy position and the cervix brought into view with sterile speculum.  Portio of cervix cleansed x 2 with betadine swabs.  A tenaculum was placed in the anterior lip of the cervix.  The uterus was sounded for depth of 7 cm. A pipelle was introduced to into the uterus, suction created,  and an endometrial sample was obtained. All equipment was removed and accounted for.  The patient tolerated the procedure well.    Patient given post procedure instructions. The patient will return in 2 weeks for results.      Assessment & Plan:   Problem List Items Addressed This Visit      Unprioritized   Menorrhagia with irregular cycle - Primary    Suspect she may be menopausal. Has h/o abnl pap and endometrial mass--likely polyp which was likely aborting and likely the cause of bleeding. Removed today--EMB pending, re-check [place].      Relevant Orders   Follicle stimulating hormone  TSH   Prolactin   CBC   US PELVIC COMPLETE WITH TRANSVAGINAL   Pregnancy, urine POC (Completed)   Surgical pathology( [facility])   Surgical pathology( [facility])    Other Visit Diagnoses    Encounter for Papanicolaou smear for cervical cancer screening       Relevant Orders   Cytology - PAP( [facility])   Encounter for screening mammogram for malignant neoplasm of breast       Relevant Orders   MM 3D SCREEN BREAST BILATERAL      Total face-to-face time with patient: 20 minutes. Over 50% of encounter was spent on counseling and coordination of care. Return in about 4 weeks (around 07/14/2019) for needs U/S prefers am appt.  Donnamae Jude 06/16/2019 5:05 PM

## 2019-06-16 NOTE — Assessment & Plan Note (Signed)
Suspect she may be menopausal. Has h/o abnl pap and endometrial mass--likely polyp which was likely aborting and likely the cause of bleeding. Removed today--EMB pending, re-check [place].

## 2019-06-17 ENCOUNTER — Telehealth (INDEPENDENT_AMBULATORY_CARE_PROVIDER_SITE_OTHER): Payer: Medicare HMO | Admitting: Lactation Services

## 2019-06-17 DIAGNOSIS — N921 Excessive and frequent menstruation with irregular cycle: Secondary | ICD-10-CM

## 2019-06-17 LAB — CBC
Hematocrit: 41.8 % (ref 34.0–46.6)
Hemoglobin: 13.3 g/dL (ref 11.1–15.9)
MCH: 21.5 pg — ABNORMAL LOW (ref 26.6–33.0)
MCHC: 31.8 g/dL (ref 31.5–35.7)
MCV: 68 fL — ABNORMAL LOW (ref 79–97)
Platelets: 242 10*3/uL (ref 150–450)
RBC: 6.18 x10E6/uL — ABNORMAL HIGH (ref 3.77–5.28)
RDW: 20.1 % — ABNORMAL HIGH (ref 11.7–15.4)
WBC: 6 10*3/uL (ref 3.4–10.8)

## 2019-06-17 LAB — TSH: TSH: 2.26 u[IU]/mL (ref 0.450–4.500)

## 2019-06-17 LAB — PROLACTIN: Prolactin: 77.8 ng/mL — ABNORMAL HIGH (ref 4.8–23.3)

## 2019-06-17 LAB — SURGICAL PATHOLOGY

## 2019-06-17 LAB — FOLLICLE STIMULATING HORMONE: FSH: 40.8 m[IU]/mL

## 2019-06-17 NOTE — Telephone Encounter (Signed)
-----   Message from Donnamae Jude, MD sent at 06/17/2019 10:01 AM EST ----- She appears close to menopause--needs to return for fasting Prolactin level please.-other labs are ok. Please inform pt

## 2019-06-17 NOTE — Telephone Encounter (Signed)
Called pt to inform her of lab results. Informed pt she needs to come in for a fasting Prolactin level. Pt agreeable. She will come Monday 1/25 at 9 am. She is aware that she is not to eat or drink after midnight on Sunday night.

## 2019-06-20 ENCOUNTER — Other Ambulatory Visit: Payer: Self-pay

## 2019-06-20 ENCOUNTER — Other Ambulatory Visit: Payer: Medicare HMO

## 2019-06-20 DIAGNOSIS — N921 Excessive and frequent menstruation with irregular cycle: Secondary | ICD-10-CM

## 2019-06-20 LAB — CYTOLOGY - PAP
Comment: NEGATIVE
Diagnosis: NEGATIVE
Diagnosis: REACTIVE
High risk HPV: NEGATIVE

## 2019-06-21 ENCOUNTER — Encounter: Payer: Self-pay | Admitting: General Practice

## 2019-06-21 LAB — PROLACTIN: Prolactin: 55 ng/mL — ABNORMAL HIGH (ref 4.8–23.3)

## 2019-06-22 ENCOUNTER — Telehealth: Payer: Self-pay | Admitting: Lactation Services

## 2019-06-22 NOTE — Telephone Encounter (Signed)
Called pt to inform her of her Prolactin level results. Received a message that phone was not in service and then a message that call cannot be completed at this time.

## 2019-06-22 NOTE — Telephone Encounter (Signed)
-----   Message from Donnamae Jude, MD sent at 06/22/2019  3:58 PM EST ----- I think her prolactin is elevated related to her Invega--we will check the level again in 6 months.

## 2019-06-22 NOTE — Telephone Encounter (Signed)
Notified pt that her prolactin levels are elevated due to possibly that she took [place] injection.  She will need to follow up with a lab in 6 months to check her levels again.  Mel Almond, RN 06/22/19

## 2019-06-23 ENCOUNTER — Other Ambulatory Visit: Payer: Self-pay

## 2019-06-23 ENCOUNTER — Ambulatory Visit (HOSPITAL_COMMUNITY)
Admission: RE | Admit: 2019-06-23 | Discharge: 2019-06-23 | Disposition: A | Discharge status: Home / Self Care | Payer: Medicare HMO | Source: Ambulatory Visit | Attending: Family Medicine | Admitting: Family Medicine

## 2019-06-23 DIAGNOSIS — N921 Excessive and frequent menstruation with irregular cycle: Secondary | ICD-10-CM

## 2019-06-27 ENCOUNTER — Encounter (HOSPITAL_COMMUNITY): Payer: Self-pay | Admitting: Emergency Medicine

## 2019-06-27 ENCOUNTER — Other Ambulatory Visit: Payer: Self-pay

## 2019-06-27 ENCOUNTER — Emergency Department (HOSPITAL_COMMUNITY)
Admission: EM | Admit: 2019-06-27 | Discharge: 2019-06-27 | Disposition: A | Discharge status: Home / Self Care | Payer: Medicare HMO | Attending: Emergency Medicine | Admitting: Emergency Medicine

## 2019-06-27 DIAGNOSIS — I10 Essential (primary) hypertension: Secondary | ICD-10-CM | POA: Insufficient documentation

## 2019-06-27 DIAGNOSIS — N939 Abnormal uterine and vaginal bleeding, unspecified: Secondary | ICD-10-CM | POA: Diagnosis not present

## 2019-06-27 DIAGNOSIS — Z79899 Other long term (current) drug therapy: Secondary | ICD-10-CM | POA: Insufficient documentation

## 2019-06-27 DIAGNOSIS — R58 Hemorrhage, not elsewhere classified: Secondary | ICD-10-CM | POA: Diagnosis not present

## 2019-06-27 LAB — CBC
HCT: 37.1 % (ref 36.0–46.0)
Hemoglobin: 11.8 g/dL — ABNORMAL LOW (ref 12.0–15.0)
MCH: 21.7 pg — ABNORMAL LOW (ref 26.0–34.0)
MCHC: 31.8 g/dL (ref 30.0–36.0)
MCV: 68.1 fL — ABNORMAL LOW (ref 80.0–100.0)
Platelets: 208 10*3/uL (ref 150–400)
RBC: 5.45 MIL/uL — ABNORMAL HIGH (ref 3.87–5.11)
RDW: 19.5 % — ABNORMAL HIGH (ref 11.5–15.5)
WBC: 5.6 10*3/uL (ref 4.0–10.5)
nRBC: 0 % (ref 0.0–0.2)

## 2019-06-27 LAB — I-STAT BETA HCG BLOOD, ED (MC, WL, AP ONLY): I-stat hCG, quantitative: <5 m[IU]/mL (ref <5)

## 2019-06-27 NOTE — ED Triage Notes (Signed)
Patient arrived with EMS from home reports intermittent vaginal bleeding since December last year , denies abdominal pain, no fever or chills .

## 2019-06-27 NOTE — ED Provider Notes (Cosign Needed)
[facility] EMERGENCY DEPARTMENT Provider Note   CSN: [id number] Arrival date & time:        History Chief Complaint  Patient presents with  . Vaginal Bleeding    Melissa Dougherty is a 49 y.o. female.  Patient presents to the ED with a chief complaint of vaginal bleeding.  She states that she has been bleeding for the past 3 months.  She was seen a little over a week ago by Dr. Kennon Rounds and was thought to be aborting and endometrial polyp.  This was removed and sent for pathology, which showed benign proliferative endometrial tissue.  Patient states that she continues to have bleeding and is using 2 pads per day.  She reports some cramping.  She denies any other associated symptoms.  The history is provided by the patient. No language interpreter was used.       Past Medical History:  Diagnosis Date  . Alcohol abuse   . Allergy   . Bipolar disorder ([place])   . Depression   . Drug abuse ([place])   . GERD (gastroesophageal reflux disease)   . Heart murmur   . Hypertension   . Seizures ([place])    Last seizure was in 1989    Patient Active Problem List   Diagnosis Date Noted  . Menorrhagia with irregular cycle 06/16/2019  . Bipolar 1 disorder, depressed, severe ([place]) 01/31/2019  . Sensorineural hearing loss (SNHL) of both ears 08/09/2018  . Increased storage iron 02/18/2016  . Neuropathy 02/18/2016  . Otitis media 02/18/2016  . Fatigue 02/07/2016  . Constipation 02/07/2016  . Acute upper respiratory infection 01/25/2016  . Headache 12/25/2015  . Routine general medical examination at a health care facility 11/01/2015  . Medicare welcome exam 11/01/2015  . Abdominal bloating 10/16/2015  . Bipolar 1 disorder, mixed ([place]) 01/10/2014  . Bipolar I disorder, most recent episode (or current) mixed, moderate 12/24/2013  . Adjustment disorder with mixed anxiety and depressed mood 12/23/2013  . MDD (major depressive disorder) 12/23/2013  . Depressive disorder  12/22/2013  . Papanicolaou smear of cervix with atypical squamous cells of undetermined significance (ASC-US) 10/12/2013  . DEPRESSION 02/17/2007  . GERD 02/17/2007  . SEIZURE DISORDER, HX OF 02/17/2007    Past Surgical History:  Procedure Laterality Date  . BACK SURGERY    . ESOPHAGOPLASTY    . TUBAL LIGATION       OB History    Gravida  2   Para  2   Term  2   Preterm      AB      Living  2     SAB      TAB      Ectopic      Multiple      Live Births  2           Family History  Problem Relation Age of Onset  . Diabetes Mother   . Stroke Mother   . Alcohol abuse Father   . Diabetes Maternal Grandmother   . Hypertension Maternal Grandmother   . Breast cancer Maternal Aunt     Social History   Tobacco Use  . Smoking status: Never Smoker  . Smokeless tobacco: Never Used  Substance Use Topics  . Alcohol use: Not Currently  . Drug use: Not Currently    Comment: clean x 5 years    Home Medications Prior to Admission medications   Medication Sig Start Date End Date Taking? Authorizing Provider  FLUoxetine (PROZAC) 20 MG capsule Take 1 capsule (20 mg total) by mouth daily. 02/04/19   Johnn Hai, MD  INVEGA SUSTENNA 234 MG/1.5ML SUSY injection Inject 1.5 mLs as directed every 30 (thirty) days.  01/18/19   [provider]  LOTEMAX SM 0.38 % GEL Place 1 drop into the right eye 3 (three) times daily. 03/17/19   [provider]  naproxen (NAPROSYN) 500 MG tablet Take 1 tablet (500 mg total) by mouth 2 (two) times daily. 05/31/19   Raylene Everts, MD  nitrofurantoin, macrocrystal-monohydrate, (MACROBID) 100 MG capsule Take 1 capsule (100 mg total) by mouth 2 (two) times daily. Patient not taking: Reported on 06/16/2019 05/31/19   Raylene Everts, MD  pantoprazole (PROTONIX) 40 MG tablet Take 40 mg by mouth daily. 01/11/19   [provider]  perphenazine (TRILAFON) 2 MG tablet Take 1 tablet (2 mg total) by mouth at bedtime.  02/03/19   Johnn Hai, MD  polyethylene glycol (MIRALAX / GLYCOLAX) 17 g packet Take 17 g by mouth daily. Patient not taking: Reported on 06/16/2019 01/24/19   Wieters, Hallie C, PA-C  PROLENSA 0.07 % SOLN Place 1 drop into the right eye daily. 03/17/19   [provider]  temazepam (RESTORIL) 15 MG capsule Take 1 capsule (15 mg total) by mouth at bedtime as needed for sleep. 02/03/19   Johnn Hai, MD  traZODone (DESYREL) 50 MG tablet Take 50 mg by mouth at bedtime.    [provider]  benztropine (COGENTIN) 0.5 MG tablet Take 0.5 mg by mouth at bedtime. 01/17/19 05/31/19  [provider]  divalproex (DEPAKOTE SPRINKLE) 125 MG capsule Take 1 capsule (125 mg total) by mouth every 12 (twelve) hours. 02/03/19 05/31/19  Johnn Hai, MD  hydrochlorothiazide (MICROZIDE) 12.5 MG capsule Take 12.5 mg by mouth daily. 01/26/19 05/31/19  [provider]  omeprazole (PRILOSEC) 20 MG capsule Take 20 mg by mouth 2 (two) times daily before a meal.  03/03/19 05/31/19  [provider]  rizatriptan (MAXALT) 10 MG tablet Take 1 tablet by mouth at the onset of headache. May repeat in 2 hours if needed Patient not taking: Reported on 01/31/2019 01/02/16 05/31/19  Golden Circle, FNP    Allergies    Patient has no known allergies.  Review of Systems   Review of Systems  All other systems reviewed and are negative.   Physical Exam Updated Vital Signs BP 137/71 (BP Location: Right Arm)   Pulse 89   Temp 97.8 F (36.6 C) (Oral)   Resp 14   SpO2 98%   Physical Exam Vitals and nursing note reviewed.  Constitutional:      General: She is not in acute distress.    Appearance: She is well-developed.  HENT:     Head: Normocephalic and atraumatic.  Eyes:     Conjunctiva/sclera: Conjunctivae normal.  Cardiovascular:     Rate and Rhythm: Normal rate.     Heart sounds: No murmur.  Pulmonary:     Effort: Pulmonary effort is normal. No respiratory distress.  Abdominal:      General: There is no distension.  Musculoskeletal:     Cervical back: Neck supple.     Comments: Moves all extremities  Skin:    General: Skin is warm and dry.  Neurological:     Mental Status: She is alert and oriented to person, place, and time.  Psychiatric:        Mood and Affect: Mood normal.  Behavior: Behavior normal.     ED Results / Procedures / Treatments   Labs (all labs ordered are listed, but only abnormal results are displayed) Labs Reviewed  CBC    EKG None  Radiology No results found.  Procedures Procedures (including critical care time)  Medications Ordered in ED Medications - No data to display  ED Course  I have reviewed the triage vital signs and the nursing notes.  Pertinent labs & imaging results that were available during my care of the patient were reviewed by me and considered in my medical decision making (see chart for details).    MDM Rules/Calculators/A&P                      Patient with vaginal bleeding x 3 months.    Recent US showed probably endometrial polyp.  This was removed during a visit with Dr. Kennon Rounds and found to be benign.  Patient states that she is uncertain what to do now, and continues to have bleeding.  H/H is pending.  VSS.  I don't think that the patient requires any additional workup in the ER tonight.   Recommend outpatient follow-up.  Patient signed out to [place], PA-C.  Final Clinical Impression(s) / ED Diagnoses Final diagnoses:  Vaginal bleeding    Rx / DC Orders ED Discharge Orders    None       Montine Circle, PA-C 06/27/19 [id number]

## 2019-06-27 NOTE — ED Provider Notes (Addendum)
Received patient as a handoff at shift change from Lorre Munroe, [place].  HPI as obtained by handoff provider: Patient presents to the ED with a chief complaint of vaginal bleeding.  She states that she has been bleeding for the past 3 months.  She was seen a little over a week ago by Dr. Kennon Rounds and was thought to be aborting and endometrial polyp.  This was removed and sent for pathology, which showed benign proliferative endometrial tissue.  Patient states that she continues to have bleeding and is using 2 pads per day.  She reports some cramping.  She denies any other associated symptoms.  Physical Exam: 8:10 AM Notified by RN that patient had eloped prior to my examination.   Assessment/Plan: Recent US showed probably endometrial polyp.  This was removed during a visit with Dr. Kennon Rounds and found to be benign.  Patient states that she is uncertain what to do now, and continues to have bleeding.  H/H is pending.  VSS.  I don't think that the patient requires any additional workup in the ER tonight.   Recommend outpatient follow-up.  Patient signed out to [place], PA-C.  Patient was handed off with CBC pending.  If patient remains hemodynamically stable with no significant anemia, plan is for discharge home with outpatient follow-up with her OB/GYN, Dr. Kennon Rounds.  I was notified at 6:52 AM by RN that patient was "tired of waiting" and walked out before I was able to personally evaluate patient.  Patient's hemoglobin was only mildly below normal limits at 11.8.  Hemoglobin 1 month ago was 11.7.  Patient had been notified by handoff provider to follow-up with her OB/GYN.   Attempted to notify patient of her results.  Her mobile phone listed [phone] is out of service and there was no answer on her home phone at [phone].  Do not feel as though additional times warranted given 65-month history of vaginal bleeding and reassuring only mildly decreased Hgb.  She was hemodynamically stable.    Corena Herter, PA-C 06/27/19 0717    Corena Herter, PA-C 06/27/19 0810    Hayden Rasmussen, MD 06/27/19 Jeri Lager

## 2019-06-29 ENCOUNTER — Telehealth: Payer: Self-pay | Admitting: Family Medicine

## 2019-06-29 NOTE — Telephone Encounter (Signed)
Patient called to say she has been passing blood clots, and need to know what to do.

## 2019-06-29 NOTE — Telephone Encounter (Signed)
Called pt and LM on [phone] and stated that if she continues to have questions to please give the office a call.  If not then she will f/u with the provider on 07/18/19 at 10:35am.    Mel Almond, RN

## 2019-06-30 ENCOUNTER — Other Ambulatory Visit: Payer: Self-pay

## 2019-06-30 ENCOUNTER — Emergency Department (HOSPITAL_COMMUNITY)
Admission: EM | Admit: 2019-06-30 | Discharge: 2019-06-30 | Disposition: A | Discharge status: Home / Self Care | Payer: Medicare HMO | Attending: Emergency Medicine | Admitting: Emergency Medicine

## 2019-06-30 ENCOUNTER — Encounter (HOSPITAL_COMMUNITY): Payer: Self-pay | Admitting: Emergency Medicine

## 2019-06-30 DIAGNOSIS — F319 Bipolar disorder, unspecified: Secondary | ICD-10-CM | POA: Insufficient documentation

## 2019-06-30 DIAGNOSIS — N939 Abnormal uterine and vaginal bleeding, unspecified: Secondary | ICD-10-CM

## 2019-06-30 DIAGNOSIS — Z79899 Other long term (current) drug therapy: Secondary | ICD-10-CM | POA: Insufficient documentation

## 2019-06-30 DIAGNOSIS — R1030 Lower abdominal pain, unspecified: Secondary | ICD-10-CM | POA: Insufficient documentation

## 2019-06-30 DIAGNOSIS — I10 Essential (primary) hypertension: Secondary | ICD-10-CM | POA: Diagnosis not present

## 2019-06-30 LAB — BASIC METABOLIC PANEL
Anion gap: 9 (ref 5–15)
BUN: 8 mg/dL (ref 6–20)
CO2: 25 mmol/L (ref 22–32)
Calcium: 8.9 mg/dL (ref 8.9–10.3)
Chloride: 103 mmol/L (ref 98–111)
Creatinine, Ser: 0.93 mg/dL (ref 0.44–1.00)
GFR calc Af Amer: >60 mL/min (ref >60)
GFR calc non Af Amer: >60 mL/min (ref >60)
Glucose, Bld: 86 mg/dL (ref 70–99)
Potassium: 3.7 mmol/L (ref 3.5–5.1)
Sodium: 137 mmol/L (ref 135–145)

## 2019-06-30 LAB — CBC WITH DIFFERENTIAL/PLATELET
Abs Immature Granulocytes: 0 10*3/uL (ref 0.00–0.07)
Basophils Absolute: 0.1 10*3/uL (ref 0.0–0.1)
Basophils Relative: 2 %
Eosinophils Absolute: 0.1 10*3/uL (ref 0.0–0.5)
Eosinophils Relative: 1 %
HCT: 35.8 % — ABNORMAL LOW (ref 36.0–46.0)
Hemoglobin: 11.2 g/dL — ABNORMAL LOW (ref 12.0–15.0)
Lymphocytes Relative: 33 %
Lymphs Abs: 1.9 10*3/uL (ref 0.7–4.0)
MCH: 21.4 pg — ABNORMAL LOW (ref 26.0–34.0)
MCHC: 31.3 g/dL (ref 30.0–36.0)
MCV: 68.5 fL — ABNORMAL LOW (ref 80.0–100.0)
Monocytes Absolute: 0.2 10*3/uL (ref 0.1–1.0)
Monocytes Relative: 4 %
Neutro Abs: 3.5 10*3/uL (ref 1.7–7.7)
Neutrophils Relative %: 60 %
Platelets: 177 10*3/uL (ref 150–400)
RBC: 5.23 MIL/uL — ABNORMAL HIGH (ref 3.87–5.11)
RDW: 19.4 % — ABNORMAL HIGH (ref 11.5–15.5)
WBC: 5.9 10*3/uL (ref 4.0–10.5)
nRBC: 0 % (ref 0.0–0.2)
nRBC: 0 /100 WBC

## 2019-06-30 MED ORDER — HYDROCODONE-ACETAMINOPHEN 5-325 MG PO TABS
1.0000 | ORAL_TABLET | Freq: Once | ORAL | Status: AC
  Administered 2019-06-30: 1 via ORAL
  Filled 2019-06-30: qty 1

## 2019-06-30 NOTE — Discharge Instructions (Addendum)
Follow up with your GYN doctor as soon as possible. Return here as needed.

## 2019-06-30 NOTE — ED Triage Notes (Signed)
Pt arrives via POV with c/o of vaginal bleeding X1 week. Pt states she had polyps removed and has not stopped bleeding since. Pt estimates she goes through about 3-4 depend briefs per day. 10/10 abdominal pain.

## 2019-07-01 NOTE — ED Provider Notes (Signed)
[facility] EMERGENCY DEPARTMENT Provider Note   CSN: [id number] Arrival date & time: 06/30/19  [id number]     History No chief complaint on file.   Melissa Dougherty is a 49 y.o. female.  HPI Patient presents to the emergency department with ongoing vaginal bleeding over the last few months.  The patient states that she was seen by Dr. Kennon Rounds of gynecology and they removed polyps from her uterus.  They felt that these were contributing to her bleeding so they were removed.  The patient states that the bleeding has slowed but has not stopped completely.  Patient states that she is having lower abdominal pain at this time.  The patient denies chest pain, shortness of breath, headache,blurred vision, neck pain, fever, cough, weakness, numbness, dizziness, anorexia, edema,  nausea, vomiting, diarrhea, rash, back pain, dysuria, hematemesis, bloody stool, near syncope, or syncope.    Past Medical History:  Diagnosis Date  . Alcohol abuse   . Allergy   . Bipolar disorder ([place])   . Depression   . Drug abuse ([place])   . GERD (gastroesophageal reflux disease)   . Heart murmur   . Hypertension   . Seizures ([place])    Last seizure was in 1989    Patient Active Problem List   Diagnosis Date Noted  . Menorrhagia with irregular cycle 06/16/2019  . Bipolar 1 disorder, depressed, severe ([place]) 01/31/2019  . Sensorineural hearing loss (SNHL) of both ears 08/09/2018  . Increased storage iron 02/18/2016  . Neuropathy 02/18/2016  . Otitis media 02/18/2016  . Fatigue 02/07/2016  . Constipation 02/07/2016  . Acute upper respiratory infection 01/25/2016  . Headache 12/25/2015  . Routine general medical examination at a health care facility 11/01/2015  . Medicare welcome exam 11/01/2015  . Abdominal bloating 10/16/2015  . Bipolar 1 disorder, mixed ([place]) 01/10/2014  . Bipolar I disorder, most recent episode (or current) mixed, moderate 12/24/2013  . Adjustment disorder with mixed anxiety  and depressed mood 12/23/2013  . MDD (major depressive disorder) 12/23/2013  . Depressive disorder 12/22/2013  . Papanicolaou smear of cervix with atypical squamous cells of undetermined significance (ASC-US) 10/12/2013  . DEPRESSION 02/17/2007  . GERD 02/17/2007  . SEIZURE DISORDER, HX OF 02/17/2007    Past Surgical History:  Procedure Laterality Date  . BACK SURGERY    . ESOPHAGOPLASTY    . TUBAL LIGATION       OB History    Gravida  2   Para  2   Term  2   Preterm      AB      Living  2     SAB      TAB      Ectopic      Multiple      Live Births  2           Family History  Problem Relation Age of Onset  . Diabetes Mother   . Stroke Mother   . Alcohol abuse Father   . Diabetes Maternal Grandmother   . Hypertension Maternal Grandmother   . Breast cancer Maternal Aunt     Social History   Tobacco Use  . Smoking status: Never Smoker  . Smokeless tobacco: Never Used  Substance Use Topics  . Alcohol use: Not Currently  . Drug use: Not Currently    Comment: clean x 5 years    Home Medications Prior to Admission medications   Medication Sig Start Date End Date Taking? Authorizing Provider  FLUoxetine (PROZAC) 20 MG capsule Take 1 capsule (20 mg total) by mouth daily. 02/04/19   Johnn Hai, MD  INVEGA SUSTENNA 234 MG/1.5ML SUSY injection Inject 1.5 mLs as directed every 30 (thirty) days.  01/18/19   [provider]  LOTEMAX SM 0.38 % GEL Place 1 drop into the right eye 3 (three) times daily. 03/17/19   [provider]  naproxen (NAPROSYN) 500 MG tablet Take 1 tablet (500 mg total) by mouth 2 (two) times daily. 05/31/19   Raylene Everts, MD  nitrofurantoin, macrocrystal-monohydrate, (MACROBID) 100 MG capsule Take 1 capsule (100 mg total) by mouth 2 (two) times daily. Patient not taking: Reported on 06/16/2019 05/31/19   Raylene Everts, MD  pantoprazole (PROTONIX) 40 MG tablet Take 40 mg by mouth daily. 01/11/19   [provider]  perphenazine (TRILAFON) 2 MG tablet Take 1 tablet (2 mg total) by mouth at bedtime. 02/03/19   Johnn Hai, MD  polyethylene glycol (MIRALAX / GLYCOLAX) 17 g packet Take 17 g by mouth daily. Patient not taking: Reported on 06/16/2019 01/24/19   Wieters, Hallie C, PA-C  PROLENSA 0.07 % SOLN Place 1 drop into the right eye daily. 03/17/19   [provider]  temazepam (RESTORIL) 15 MG capsule Take 1 capsule (15 mg total) by mouth at bedtime as needed for sleep. 02/03/19   Johnn Hai, MD  traZODone (DESYREL) 50 MG tablet Take 50 mg by mouth at bedtime.    [provider]  benztropine (COGENTIN) 0.5 MG tablet Take 0.5 mg by mouth at bedtime. 01/17/19 05/31/19  [provider]  divalproex (DEPAKOTE SPRINKLE) 125 MG capsule Take 1 capsule (125 mg total) by mouth every 12 (twelve) hours. 02/03/19 05/31/19  Johnn Hai, MD  hydrochlorothiazide (MICROZIDE) 12.5 MG capsule Take 12.5 mg by mouth daily. 01/26/19 05/31/19  [provider]  omeprazole (PRILOSEC) 20 MG capsule Take 20 mg by mouth 2 (two) times daily before a meal.  03/03/19 05/31/19  [provider]  rizatriptan (MAXALT) 10 MG tablet Take 1 tablet by mouth at the onset of headache. May repeat in 2 hours if needed Patient not taking: Reported on 01/31/2019 01/02/16 05/31/19  [place], FNP    Allergies    Patient has no known allergies.  Review of Systems   Review of Systems All other systems negative except as documented in the HPI. All pertinent positives and negatives as reviewed in the HPI. Physical Exam Updated Vital Signs BP 103/67 (BP Location: Right Arm)   Pulse 67   Temp 97.8 F (36.6 C) (Oral)   Resp 17   Ht 5\' 1"  (1.549 m)   Wt 70 kg   SpO2 98%   BMI 29.16 kg/m   Physical Exam Vitals and nursing note reviewed.  Constitutional:      General: She is not in acute distress.    Appearance: She is well-developed.  HENT:     Head: Normocephalic and atraumatic.     Nose:  Nose normal.  Eyes:     Pupils: Pupils are equal, round, and reactive to light.  Cardiovascular:     Rate and Rhythm: Normal rate and regular rhythm.     Heart sounds: Normal heart sounds. No murmur. No friction rub. No gallop.   Pulmonary:     Effort: Pulmonary effort is normal. No respiratory distress.     Breath sounds: Normal breath sounds. No wheezing.  Abdominal:     General: Bowel sounds are normal. There is  no distension.     Palpations: Abdomen is soft.     Tenderness: There is no abdominal tenderness.  Musculoskeletal:     Cervical back: Normal range of motion and neck supple.  Skin:    General: Skin is warm and dry.     Capillary Refill: Capillary refill takes less than 2 seconds.     Findings: No erythema or rash.  Neurological:     Mental Status: She is alert and oriented to person, place, and time.     Motor: No abnormal muscle tone.     Coordination: Coordination normal.  Psychiatric:        Behavior: Behavior normal.     ED Results / Procedures / Treatments   Labs (all labs ordered are listed, but only abnormal results are displayed) Labs Reviewed  CBC WITH DIFFERENTIAL/PLATELET - Abnormal; Notable for the following components:      Result Value   RBC 5.23 (*)    Hemoglobin 11.2 (*)    HCT 35.8 (*)    MCV 68.5 (*)    MCH 21.4 (*)    RDW 19.4 (*)    All other components within normal limits  BASIC METABOLIC PANEL    EKG None  Radiology No results found.  Procedures Procedures (including critical care time)  Medications Ordered in ED Medications  HYDROcodone-acetaminophen (NORCO/VICODIN) 5-325 MG per tablet 1 tablet (1 tablet Oral Given 06/30/19 0936)    ED Course  I have reviewed the triage vital signs and the nursing notes.  Pertinent labs & imaging results that were available during my care of the patient were reviewed by me and considered in my medical decision making (see chart for details).    MDM Rules/Calculators/A&P                       Patient states that she needs to be discharged and that she will follow-up with her GYN doctor.  I did advise her to return here as needed.  We did not start any medications to cease the bleeding at this time.  I did advise her to monitor her symptoms and if any change or gets worse she needs to return for further evaluation.  Patient agrees this plan and all questions were answered.  Patient is feeling better at this time. Final Clinical Impression(s) / ED Diagnoses Final diagnoses:  Vaginal bleeding    Rx / DC Orders ED Discharge Orders    None       Dalia Heading, PA-C 07/01/19 [id number]    Pattricia Boss, MD 07/01/19 [phone]

## 2019-07-07 DIAGNOSIS — I10 Essential (primary) hypertension: Secondary | ICD-10-CM | POA: Diagnosis not present

## 2019-07-07 DIAGNOSIS — E785 Hyperlipidemia, unspecified: Secondary | ICD-10-CM | POA: Diagnosis not present

## 2019-07-07 DIAGNOSIS — K219 Gastro-esophageal reflux disease without esophagitis: Secondary | ICD-10-CM | POA: Diagnosis not present

## 2019-07-07 DIAGNOSIS — E119 Type 2 diabetes mellitus without complications: Secondary | ICD-10-CM | POA: Diagnosis not present

## 2019-07-07 DIAGNOSIS — K222 Esophageal obstruction: Secondary | ICD-10-CM | POA: Diagnosis not present

## 2019-07-07 DIAGNOSIS — F314 Bipolar disorder, current episode depressed, severe, without psychotic features: Secondary | ICD-10-CM | POA: Diagnosis not present

## 2019-07-07 DIAGNOSIS — F319 Bipolar disorder, unspecified: Secondary | ICD-10-CM | POA: Diagnosis not present

## 2019-07-07 DIAGNOSIS — F341 Dysthymic disorder: Secondary | ICD-10-CM | POA: Diagnosis not present

## 2019-07-13 ENCOUNTER — Encounter: Payer: Medicare HMO | Admitting: Family Medicine

## 2019-07-13 ENCOUNTER — Telehealth: Payer: Self-pay | Admitting: Family Medicine

## 2019-07-13 NOTE — Telephone Encounter (Signed)
States when she eats her stomach blows up and she cant eat any more and she gets chills and is always cold all the time. Stated she started menopause and stopped bleeding and cant stop urinating on herself. I advised her to keep her appointment on 2/22 and to advised the provider of all of these things then. She verbalized understanding and had no questions.

## 2019-07-13 NOTE — Telephone Encounter (Signed)
Patient has some questions, and is requesting a call back from clinical staff today.

## 2019-07-18 ENCOUNTER — Other Ambulatory Visit: Payer: Self-pay | Admitting: Family Medicine

## 2019-07-18 ENCOUNTER — Encounter: Payer: Self-pay | Admitting: Family Medicine

## 2019-07-18 ENCOUNTER — Other Ambulatory Visit: Payer: Self-pay

## 2019-07-18 ENCOUNTER — Ambulatory Visit (INDEPENDENT_AMBULATORY_CARE_PROVIDER_SITE_OTHER): Payer: Medicare HMO | Admitting: Family Medicine

## 2019-07-18 ENCOUNTER — Ambulatory Visit: Payer: Medicare HMO | Admitting: Family Medicine

## 2019-07-18 DIAGNOSIS — N3941 Urge incontinence: Secondary | ICD-10-CM

## 2019-07-18 DIAGNOSIS — N6489 Other specified disorders of breast: Secondary | ICD-10-CM

## 2019-07-18 DIAGNOSIS — N921 Excessive and frequent menstruation with irregular cycle: Secondary | ICD-10-CM

## 2019-07-18 DIAGNOSIS — Z1231 Encounter for screening mammogram for malignant neoplasm of breast: Secondary | ICD-10-CM

## 2019-07-18 LAB — GLUCOSE, CAPILLARY: Glucose-Capillary: 106 mg/dL — ABNORMAL HIGH (ref 70–99)

## 2019-07-18 NOTE — Assessment & Plan Note (Signed)
Normal eval and polyp removal, resolved. Advised to return if bleeding returns for possible further w/u and Hysteroscopic removal.

## 2019-07-18 NOTE — Progress Notes (Signed)
[facility]amm[facility] appt [facility] for 08[facility]08/19 at 1420, pt to arrive at 1400. Patient notified.   Apolonio Schneiders RN 07/18/19

## 2019-07-18 NOTE — Patient Instructions (Signed)
Empty Bladder every 2 hours while awake.  Kegel Exercises  Kegel exercises can help strengthen your pelvic floor muscles. The pelvic floor is a group of muscles that support your rectum, small intestine, and bladder. In females, pelvic floor muscles also help support the womb (uterus). These muscles help you control the flow of urine and stool. Kegel exercises are painless and simple, and they do not require any equipment. Your provider may suggest Kegel exercises to:  Improve bladder and bowel control.  Improve sexual response.  Improve weak pelvic floor muscles after surgery to remove the uterus (hysterectomy) or pregnancy (females).  Improve weak pelvic floor muscles after prostate gland removal or surgery (males). Kegel exercises involve squeezing your pelvic floor muscles, which are the same muscles you squeeze when you try to stop the flow of urine or keep from passing gas. The exercises can be done while sitting, standing, or lying down, but it is best to vary your position. Exercises How to do Kegel exercises: 1. Squeeze your pelvic floor muscles tight. You should feel a tight lift in your rectal area. If you are a female, you should also feel a tightness in your vaginal area. Keep your stomach, buttocks, and legs relaxed. 2. Hold the muscles tight for up to 10 seconds. 3. Breathe normally. 4. Relax your muscles. 5. Repeat as told by your health care provider. Repeat this exercise daily as told by your health care provider. Continue to do this exercise for at least 4-6 weeks, or for as long as told by your health care provider. You may be referred to a physical therapist who can help you learn more about how to do Kegel exercises. Depending on your condition, your health care provider may recommend:  Varying how long you squeeze your muscles.  Doing several sets of exercises every day.  Doing exercises for several weeks.  Making Kegel exercises a part of your regular exercise  routine. This information is not intended to replace advice given to you by your health care provider. Make sure you discuss any questions you have with your health care provider. Document Revised: 12/30/2017 Document Reviewed: 12/30/2017 Elsevier Patient Education  [place].

## 2019-07-18 NOTE — Assessment & Plan Note (Signed)
When bladder is full--trial of bladder training.

## 2019-07-18 NOTE — Progress Notes (Signed)
    Subjective:    Patient ID: Melissa Dougherty is a 49 y.o. female presenting with f/u biopsy on 07/18/2019  HPI: Here with bleeding. Noted to be in menopause with elevated Prolactin, likely related to psych meds. S/p uterine polyp removal and EMB in late Jan. To ED x 2 with bleeding which has finally stopped. EMB and polyp pathology were negative. Some evidence of polyp remains on u/s 1 week after biopsy. Now notes that she is losing her urine. That started after her bleeding stopped. Notes she drinks a lot of fluids. Appetite is down. CBG is normal. Has urge incontinence.  Review of Systems  Constitutional: Negative for chills and fever.  HENT: Negative for congestion, nosebleeds and rhinorrhea.   Eyes: Negative for visual disturbance.  Respiratory: Negative for chest tightness and shortness of breath.   Cardiovascular: Negative for chest pain.  Gastrointestinal: Negative for abdominal distention, abdominal pain, blood in stool, constipation, diarrhea, nausea and vomiting.  Genitourinary: Negative for dysuria, frequency, menstrual problem and vaginal bleeding.  Musculoskeletal: Negative for arthralgias.  Skin: Negative for rash.  Neurological: Negative for dizziness and headaches.  Psychiatric/Behavioral: Negative for behavioral problems, dysphoric mood and sleep disturbance.  All other systems reviewed and are negative.     Objective:    BP 138/85   Pulse (!) 101   Wt 157 lb 12.8 oz (71.6 kg)   BMI 29.82 kg/m  Physical Exam Constitutional:      General: She is not in acute distress.    Appearance: She is well-developed.  HENT:     Head: Normocephalic and atraumatic.  Eyes:     General: No scleral icterus. Cardiovascular:     Rate and Rhythm: Normal rate.  Pulmonary:     Effort: Pulmonary effort is normal.  Abdominal:     Palpations: Abdomen is soft.  Musculoskeletal:     Cervical back: Neck supple.  Skin:    General: Skin is warm and dry.  Neurological:     Mental  Status: She is alert and oriented to person, place, and time.         Assessment & Plan:   Problem List Items Addressed This Visit      Unprioritized   Menorrhagia with irregular cycle    Normal eval and polyp removal, resolved. Advised to return if bleeding returns for possible further w/u and Hysteroscopic removal.      Urge incontinence    When bladder is full--trial of bladder training.          Return in about 6 months (around 01/15/2020) for a follow-up.  Donnamae Jude 07/18/2019 11:10 AM

## 2019-07-19 DIAGNOSIS — F3113 Bipolar disorder, current episode manic without psychotic features, severe: Secondary | ICD-10-CM | POA: Diagnosis not present

## 2019-07-21 DIAGNOSIS — F3113 Bipolar disorder, current episode manic without psychotic features, severe: Secondary | ICD-10-CM | POA: Diagnosis not present

## 2019-07-22 ENCOUNTER — Telehealth: Payer: Self-pay | Admitting: Family Medicine

## 2019-07-22 ENCOUNTER — Telehealth: Payer: Self-pay

## 2019-07-22 NOTE — Telephone Encounter (Signed)
Called pt to respond to her concern of Bleeding & blood clots, advised if it's to bad to go to ER. I will forward her chart to get her scheduled for appointment per Dr. Blane Ohara previous notes, no answer, left VM.

## 2019-07-22 NOTE — Telephone Encounter (Signed)
Melissa Dougherty called to say she is bleeding and passing clots. She would like a call back from the nurse.

## 2019-07-25 ENCOUNTER — Telehealth: Payer: Self-pay | Admitting: *Deleted

## 2019-07-25 ENCOUNTER — Encounter: Payer: Self-pay | Admitting: *Deleted

## 2019-07-25 NOTE — Telephone Encounter (Signed)
Melissa Dougherty called front desk and call transferred to nurse. She reports since her visit with Dr. Kennon Rounds 07/18/19 she has been bleeding again and having to runt to bathroom to urinate. She reports she wears a depends instead of pads because of the amount of bleeding but only changes the depends once a day. Per chart review Dr. Kennon Rounds stated if bleeding returns for her to call office to be seen for possible further work up and hysteroscopic removal. I informed her I will route to registrar to schedule and to Dr. Kennon Rounds to notify. She voices understanding.  Jacques Navy

## 2019-07-26 NOTE — Telephone Encounter (Signed)
Pt has been notified of her appt scheduled with Dr. Kennon Rounds on 08/11/19.    Mel Almond, RN

## 2019-07-27 ENCOUNTER — Encounter: Payer: Self-pay | Admitting: Nurse Practitioner

## 2019-07-27 ENCOUNTER — Ambulatory Visit: Payer: Medicare HMO | Attending: Nurse Practitioner | Admitting: Nurse Practitioner

## 2019-07-27 ENCOUNTER — Other Ambulatory Visit: Payer: Self-pay

## 2019-07-27 DIAGNOSIS — N3941 Urge incontinence: Secondary | ICD-10-CM

## 2019-07-27 DIAGNOSIS — K5909 Other constipation: Secondary | ICD-10-CM | POA: Diagnosis not present

## 2019-07-27 DIAGNOSIS — Z114 Encounter for screening for human immunodeficiency virus [HIV]: Secondary | ICD-10-CM

## 2019-07-27 DIAGNOSIS — Z13228 Encounter for screening for other metabolic disorders: Secondary | ICD-10-CM

## 2019-07-27 DIAGNOSIS — Z131 Encounter for screening for diabetes mellitus: Secondary | ICD-10-CM

## 2019-07-27 DIAGNOSIS — Z13220 Encounter for screening for lipoid disorders: Secondary | ICD-10-CM

## 2019-07-27 MED ORDER — POLYETHYLENE GLYCOL 3350 17 G PO PACK: 17.0000 g | PACK | Freq: Every day | ORAL | 2 refills | Status: AC

## 2019-07-27 NOTE — Progress Notes (Signed)
Virtual Visit via Telephone Note Due to national recommendations of social distancing due to [place] 19, telehealth visit is felt to be most appropriate for this patient at this time.  I discussed the limitations, risks, security and privacy concerns of performing an evaluation and management service by telephone and the availability of in person appointments. I also discussed with the patient that there may be a patient responsible charge related to this service. The patient expressed understanding and agreed to proceed.    I connected with Melissa Dougherty on 07/27/19  at   2:30 PM EST  EDT by telephone and verified that I am speaking with the correct person using two identifiers.   Consent I discussed the limitations, risks, security and privacy concerns of performing an evaluation and management service by telephone and the availability of in person appointments. I also discussed with the patient that there may be a patient responsible charge related to this service. The patient expressed understanding and agreed to proceed.   Location of Patient: Private Residence   Location of Provider: [place] [facility] [employer] Office    Persons participating in Telemedicine visit: [name] FNP-BC [place]    History of Present Illness: Telemedicine visit for: Establish Care  has a past medical history of Alcohol abuse, Allergy, Bipolar disorder ([place]), Depression, Drug abuse ([place]), GERD (gastroesophageal reflux disease), Heart murmur, Hypertension, and Seizures ([place]).  [facility] is providing her psychiatric medications  PsyHealth Maintenance Mammogram: Scheduled for 3-15 PAP smear:  Completed this year. Seeing Dr. Kennon Rounds with GYN for menorrhagia and metrorrhagia.    Urinary Incontinence: Patient complains of urinary incontinence. . She leaks urine with with urge, with a full bladder. Patient describes the symptoms as  urge to urinate with little or no  warning and urine leaking unpredictably.  Factors associated with symptoms include PER GYN likely nearing menopause based on lab work. Evaluation to date includes UA/CS: negative for UTI.Treatment to date includes Kegel exercises, which have  not been performed by patient.   BP Readings from Last 3 Encounters:  07/18/19 138/85  06/30/19 103/67  06/27/19 113/73     Past Medical History:  Diagnosis Date  . Alcohol abuse   . Allergy   . Bipolar disorder ([place])   . Depression   . Drug abuse ([place])   . GERD (gastroesophageal reflux disease)   . Heart murmur   . Hypertension   . Seizures ([place])    Last seizure was in 1989    Past Surgical History:  Procedure Laterality Date  . BACK SURGERY    . ESOPHAGOPLASTY    . TUBAL LIGATION      Family History  Problem Relation Age of Onset  . Diabetes Mother   . Stroke Mother   . Alcohol abuse Father   . Diabetes Maternal Grandmother   . Hypertension Maternal Grandmother   . Breast cancer Maternal Aunt     Social History   Socioeconomic History  . Marital status: Single    Spouse name: Not on file  . Number of children: 2  . Years of education: 40  . Highest education level: Not on file  Occupational History  . Occupation: Disability    Comment: Epilepsy, Mental Health Issues  Tobacco Use  . Smoking status: Never Smoker  . Smokeless tobacco: Never Used  Substance and Sexual Activity  . Alcohol use: Not Currently  . Drug use: Not Currently    Comment: clean x 5 years  .  Sexual activity: Not Currently    Birth control/protection: Surgical  Other Topics Concern  . Not on file  Social History Narrative   Fun: Go shopping   Denies abuse and feels safe at home.    Social Determinants of Health   Financial Resource Strain:   . Difficulty of Paying Living Expenses: Not on file  Food Insecurity:   . Worried About Charity fundraiser in the Last Year: Not on file  . Ran Out of Food in the Last Year: Not on file  Transportation  Needs:   . Lack of Transportation (Medical): Not on file  . Lack of Transportation (Non-Medical): Not on file  Physical Activity:   . Days of Exercise per Week: Not on file  . Minutes of Exercise per Session: Not on file  Stress:   . Feeling of Stress : Not on file  Social Connections:   . Frequency of Communication with Friends and Family: Not on file  . Frequency of Social Gatherings with Friends and Family: Not on file  . Attends Religious Services: Not on file  . Active Member of Clubs or Organizations: Not on file  . Attends Archivist Meetings: Not on file  . Marital Status: Not on file     Observations/Objective: Awake, alert and oriented x 3   Review of Systems  Constitutional: Negative for fever, malaise/fatigue and weight loss.  HENT: Negative.  Negative for nosebleeds.   Eyes: Negative.  Negative for blurred vision, double vision and photophobia.  Respiratory: Negative.  Negative for cough and shortness of breath.   Cardiovascular: Negative.  Negative for chest pain, palpitations and leg swelling.  Gastrointestinal: Positive for heartburn. Negative for nausea and vomiting.  Genitourinary: Positive for urgency. Negative for flank pain and hematuria.       SEE HPI  Musculoskeletal: Negative.  Negative for myalgias.  Neurological: Negative.  Negative for dizziness, focal weakness, seizures and headaches.  Psychiatric/Behavioral: Positive for hallucinations and substance abuse. Negative for suicidal ideas.    Assessment and Plan: Alaa was seen today for establish care.  Diagnoses and all orders for this visit:  Urge incontinence of urine -     Urinalysis, Routine w reflex microscopic; Future -     Cervicovaginal ancillary only; Future Will need to refer to Urology if neg for UTI  Screening for metabolic disorder -     [id number]; Future  Lipid screening -     Lipid panel; Future  Encounter for screening for diabetes mellitus -     Hemoglobin A1c;  Future  Encounter for screening for HIV -     HIV antibody (with reflex); Future  Chronic constipation -     polyethylene glycol (MIRALAX / GLYCOLAX) 17 g packet; Take 17 g by mouth daily.     Follow Up Instructions Return in about 6 weeks (around 09/07/2019).     I discussed the assessment and treatment plan with the patient. The patient was provided an opportunity to ask questions and all were answered. The patient agreed with the plan and demonstrated an understanding of the instructions.   The patient was advised to call back or seek an in-person evaluation if the symptoms worsen or if the condition fails to improve as anticipated.  I provided 15 minutes of non-face-to-face time during this encounter including median intraservice time, reviewing previous notes, labs, imaging, medications and explaining diagnosis and management.  Gildardo Pounds, FNP-BC

## 2019-07-28 DIAGNOSIS — F3113 Bipolar disorder, current episode manic without psychotic features, severe: Secondary | ICD-10-CM | POA: Diagnosis not present

## 2019-08-02 ENCOUNTER — Ambulatory Visit: Payer: Medicare HMO

## 2019-08-02 ENCOUNTER — Other Ambulatory Visit (HOSPITAL_COMMUNITY)
Admission: RE | Admit: 2019-08-02 | Discharge: 2019-08-02 | Disposition: A | Discharge status: Home / Self Care | Payer: Medicare HMO | Source: Ambulatory Visit | Attending: Nurse Practitioner | Admitting: Nurse Practitioner

## 2019-08-02 ENCOUNTER — Other Ambulatory Visit: Payer: Self-pay

## 2019-08-02 DIAGNOSIS — Z13220 Encounter for screening for lipoid disorders: Secondary | ICD-10-CM

## 2019-08-02 DIAGNOSIS — Z131 Encounter for screening for diabetes mellitus: Secondary | ICD-10-CM | POA: Diagnosis not present

## 2019-08-02 DIAGNOSIS — Z114 Encounter for screening for human immunodeficiency virus [HIV]: Secondary | ICD-10-CM | POA: Diagnosis not present

## 2019-08-02 DIAGNOSIS — N3941 Urge incontinence: Secondary | ICD-10-CM

## 2019-08-02 DIAGNOSIS — Z13228 Encounter for screening for other metabolic disorders: Secondary | ICD-10-CM | POA: Diagnosis not present

## 2019-08-03 LAB — CERVICOVAGINAL ANCILLARY ONLY
Bacterial Vaginitis (gardnerella): NEGATIVE
Candida Glabrata: NEGATIVE
Candida Vaginitis: NEGATIVE
Chlamydia: NEGATIVE
Comment: NEGATIVE
Comment: NEGATIVE
Comment: NEGATIVE
Comment: NEGATIVE
Comment: NEGATIVE
Comment: NORMAL
Neisseria Gonorrhea: NEGATIVE
Trichomonas: NEGATIVE

## 2019-08-03 LAB — LIPID PANEL
Chol/HDL Ratio: 2.7 ratio (ref 0.0–4.4)
Cholesterol, Total: 147 mg/dL (ref 100–199)
HDL: 54 mg/dL (ref >39)
LDL Chol Calc (NIH): 72 mg/dL (ref 0–99)
Triglycerides: 119 mg/dL (ref 0–149)
VLDL Cholesterol Cal: 21 mg/dL (ref 5–40)

## 2019-08-03 LAB — URINALYSIS, ROUTINE W REFLEX MICROSCOPIC
Bilirubin, UA: NEGATIVE
Glucose, UA: NEGATIVE
Ketones, UA: NEGATIVE
Nitrite, UA: NEGATIVE
Protein,UA: NEGATIVE
RBC, UA: NEGATIVE
Specific Gravity, UA: 1.016 (ref 1.005–1.030)
Urobilinogen, Ur: 0.2 mg/dL (ref 0.2–1.0)
pH, UA: 6.5 (ref 5.0–7.5)

## 2019-08-03 LAB — HIV ANTIBODY (ROUTINE TESTING W REFLEX): HIV Screen 4th Generation wRfx: NONREACTIVE

## 2019-08-03 LAB — CMP14+EGFR
ALT: 25 IU/L (ref 0–32)
AST: 25 IU/L (ref 0–40)
Albumin/Globulin Ratio: 1.1 — ABNORMAL LOW (ref 1.2–2.2)
Albumin: 3.6 g/dL — ABNORMAL LOW (ref 3.8–4.8)
Alkaline Phosphatase: 103 IU/L (ref 39–117)
BUN/Creatinine Ratio: 7 — ABNORMAL LOW (ref 9–23)
BUN: 5 mg/dL — ABNORMAL LOW (ref 6–24)
Bilirubin Total: 0.2 mg/dL (ref 0.0–1.2)
CO2: 23 mmol/L (ref 20–29)
Calcium: 9.1 mg/dL (ref 8.7–10.2)
Chloride: 103 mmol/L (ref 96–106)
Creatinine, Ser: 0.75 mg/dL (ref 0.57–1.00)
GFR calc Af Amer: 109 mL/min/{1.73_m2} (ref >59)
GFR calc non Af Amer: 95 mL/min/{1.73_m2} (ref >59)
Globulin, Total: 3.4 g/dL (ref 1.5–4.5)
Glucose: 87 mg/dL (ref 65–99)
Potassium: 3.9 mmol/L (ref 3.5–5.2)
Sodium: 138 mmol/L (ref 134–144)
Total Protein: 7 g/dL (ref 6.0–8.5)

## 2019-08-03 LAB — MICROSCOPIC EXAMINATION: Casts: NONE SEEN /lpf

## 2019-08-03 LAB — HEMOGLOBIN A1C
Est. average glucose Bld gHb Est-mCnc: 108 mg/dL
Hgb A1c MFr Bld: 5.4 % (ref 4.8–5.6)

## 2019-08-04 ENCOUNTER — Telehealth: Payer: Self-pay | Admitting: Nurse Practitioner

## 2019-08-04 ENCOUNTER — Other Ambulatory Visit: Payer: Self-pay | Admitting: Family Medicine

## 2019-08-04 DIAGNOSIS — N6489 Other specified disorders of breast: Secondary | ICD-10-CM

## 2019-08-04 NOTE — Telephone Encounter (Signed)
Pt called and requested for lab results. Patient was identified with 2 identifiers. Patient was given lab results, verbalized understanding and had no further questions or concerns.

## 2019-08-08 ENCOUNTER — Other Ambulatory Visit: Payer: Medicare HMO

## 2019-08-09 ENCOUNTER — Telehealth: Payer: Self-pay | Admitting: Nurse Practitioner

## 2019-08-09 NOTE — Telephone Encounter (Signed)
Patient called and requested to be sent to Benjiman Core, MD at [facility] endocrinology. Patient stated that she needed to get her esophagus stretched. Please follow up at your earliest convenience.

## 2019-08-09 NOTE — Telephone Encounter (Signed)
Attempt to call patient regarding her referral request. No answer and mailbox is full.

## 2019-08-11 ENCOUNTER — Other Ambulatory Visit: Payer: Self-pay

## 2019-08-11 ENCOUNTER — Encounter: Payer: Self-pay | Admitting: Family Medicine

## 2019-08-11 ENCOUNTER — Ambulatory Visit (INDEPENDENT_AMBULATORY_CARE_PROVIDER_SITE_OTHER): Payer: Medicare HMO | Admitting: Family Medicine

## 2019-08-11 DIAGNOSIS — N3941 Urge incontinence: Secondary | ICD-10-CM

## 2019-08-11 DIAGNOSIS — N921 Excessive and frequent menstruation with irregular cycle: Secondary | ICD-10-CM

## 2019-08-11 LAB — POCT URINALYSIS DIP (DEVICE)
Bilirubin Urine: NEGATIVE
Glucose, UA: NEGATIVE mg/dL
Hgb urine dipstick: NEGATIVE
Ketones, ur: NEGATIVE mg/dL
Leukocytes,Ua: NEGATIVE
Nitrite: NEGATIVE
Protein, ur: NEGATIVE mg/dL
Specific Gravity, Urine: 1.025 (ref 1.005–1.030)
Urobilinogen, UA: 0.2 mg/dL (ref 0.0–1.0)
pH: 6 (ref 5.0–8.0)

## 2019-08-11 NOTE — Progress Notes (Signed)
   Subjective:    Patient ID: Melissa Dougherty is a 49 y.o. female presenting with No chief complaint on file.  on 08/11/2019  HPI: Here following her polyp removal. Still having occasional spotting. Has pain in her bladder. Has pain when her bladder is full. When she eats something this makes her go right to sleep. Using the bathroom q 2 hours. Having pan x several weeks. Has normal BMs q 2 days. Sometimes has some constipation. If bowels are regular, then bladder feels better.  Review of Systems  Constitutional: Negative for chills and fever.  Respiratory: Negative for shortness of breath.   Cardiovascular: Negative for chest pain.  Gastrointestinal: Negative for abdominal pain, nausea and vomiting.  Genitourinary: Negative for dysuria.  Skin: Negative for rash.      Objective:    BP 133/86   Pulse 92   Wt 162 lb 3.2 oz (73.6 kg)   BMI 30.65 kg/m  Physical Exam Constitutional:      General: She is not in acute distress.    Appearance: She is well-developed.  HENT:     Head: Normocephalic and atraumatic.  Eyes:     General: No scleral icterus. Cardiovascular:     Rate and Rhythm: Normal rate.  Pulmonary:     Effort: Pulmonary effort is normal.  Abdominal:     Palpations: Abdomen is soft.  Musculoskeletal:     Cervical back: Neck supple.  Skin:    General: Skin is warm and dry.  Neurological:     Mental Status: She is alert and oriented to person, place, and time.    Urinalysis    Component Value Date/Time   COLORURINE COLORLESS (A) 01/27/2019 2230   APPEARANCEUR Clear 08/02/2019 1100   LABSPEC 1.025 08/11/2019 1502   PHURINE 6.0 08/11/2019 1502   GLUCOSEU NEGATIVE 08/11/2019 1502   HGBUR NEGATIVE 08/11/2019 1502   BILIRUBINUR NEGATIVE 08/11/2019 1502   BILIRUBINUR Negative 08/02/2019 1100   KETONESUR NEGATIVE 08/11/2019 1502   PROTEINUR NEGATIVE 08/11/2019 1502   UROBILINOGEN 0.2 08/11/2019 1502   NITRITE NEGATIVE 08/11/2019 1502   LEUKOCYTESUR NEGATIVE  08/11/2019 1502         Assessment & Plan:   Problem List Items Addressed This Visit      Unprioritized   Menorrhagia with irregular cycle    Related to polyp which was removed. She is menopausal and should return with further bleeding.      Urge incontinence    Bladder training. Increase fiber and miralax to afford regular BMs.      Relevant Orders   POCT urinalysis dip (device) (Completed)      Total time: 20 minutes.  Return if symptoms worsen or fail to improve.  Donnamae Jude 08/12/2019 9:03 AM

## 2019-08-11 NOTE — Patient Instructions (Signed)

## 2019-08-12 ENCOUNTER — Encounter: Payer: Self-pay | Admitting: Family Medicine

## 2019-08-12 NOTE — Assessment & Plan Note (Signed)
Related to polyp which was removed. She is menopausal and should return with further bleeding.

## 2019-08-12 NOTE — Assessment & Plan Note (Signed)
Bladder training. Increase fiber and miralax to afford regular BMs.

## 2019-08-16 DIAGNOSIS — F3113 Bipolar disorder, current episode manic without psychotic features, severe: Secondary | ICD-10-CM | POA: Diagnosis not present

## 2019-08-25 ENCOUNTER — Ambulatory Visit
Admission: RE | Admit: 2019-08-25 | Discharge: 2019-08-25 | Disposition: A | Discharge status: Home / Self Care | Payer: Medicare HMO | Source: Ambulatory Visit | Attending: Family Medicine | Admitting: Family Medicine

## 2019-08-25 ENCOUNTER — Other Ambulatory Visit: Payer: Self-pay

## 2019-08-25 ENCOUNTER — Ambulatory Visit: Admission: RE | Admit: 2019-08-25 | Payer: Medicare HMO | Source: Ambulatory Visit

## 2019-08-25 DIAGNOSIS — N6489 Other specified disorders of breast: Secondary | ICD-10-CM

## 2019-08-25 DIAGNOSIS — R922 Inconclusive mammogram: Secondary | ICD-10-CM | POA: Diagnosis not present

## 2019-10-31 ENCOUNTER — Ambulatory Visit: Payer: Medicare HMO | Admitting: Nurse Practitioner

## 2021-06-01 IMAGING — RF ESOPHAGUS/BARIUM SWALLOW/TABLET STUDY
9 of 10 series · 14 of 24 positions shown · non-contrast
Comparison: 05/16/2005 upper GI.  07/01/2015 CT abdomen/pelvis.

CLINICAL DATA: 47-year-old female with a reported history of
esophageal repair as an infant, with a history of recurrent
esophageal strictures requiring dilation, now presenting with
recurrent dysphagia with globus sensation in the throat.

EXAM:
ESOPHOGRAM / BARIUM SWALLOW / BARIUM TABLET STUDY
TECHNIQUE: Combined double contrast and single contrast examination performed
using effervescent crystals, thick barium liquid, and thin barium
liquid. The patient was observed with fluoroscopy swallowing a 13 mm
barium sulphate tablet.
FLUOROSCOPY TIME:  Fluoroscopy Time:  2 minutes 54 seconds
Radiation Exposure Index (if provided by the fluoroscopic device):
181 mGy
Number of Acquired Spot Images: 8

[Series 1: sequence · 2 of 20 frames shown (1 of 7)]
[frame 4/20]
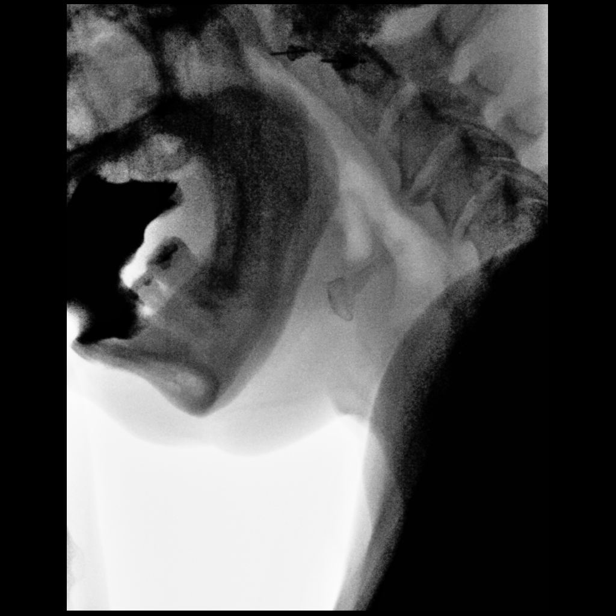
[frame 18/20]
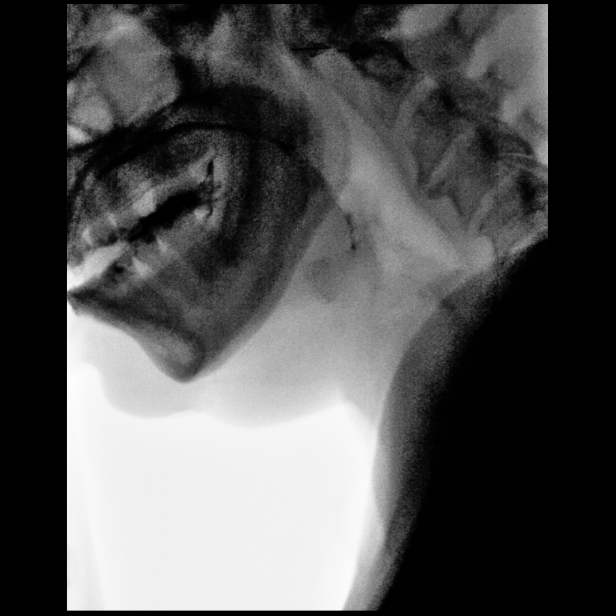

[Series 3: sequence · 1 of 31 frames shown (2 of 7)]
[frame 10/31]
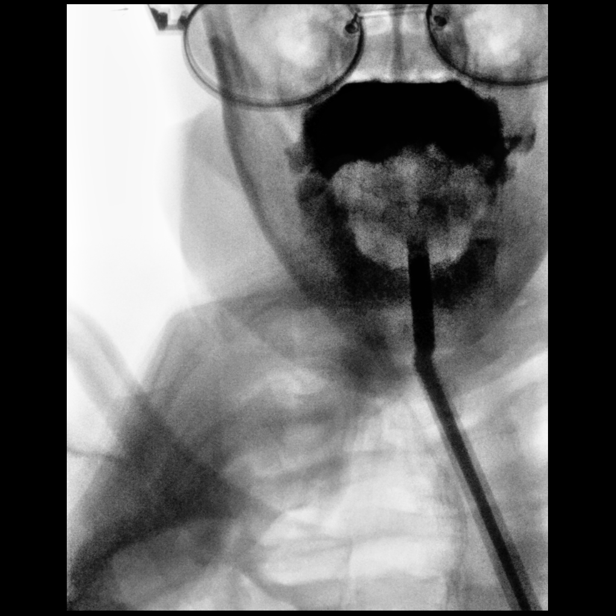

[Series 5: sequence · 2 of 4 frames shown (3 of 7)]
[frame 1/4]
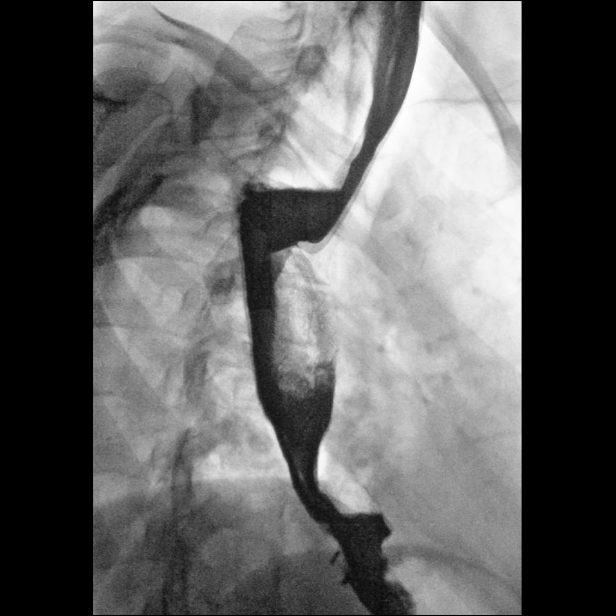
[frame 3/4]
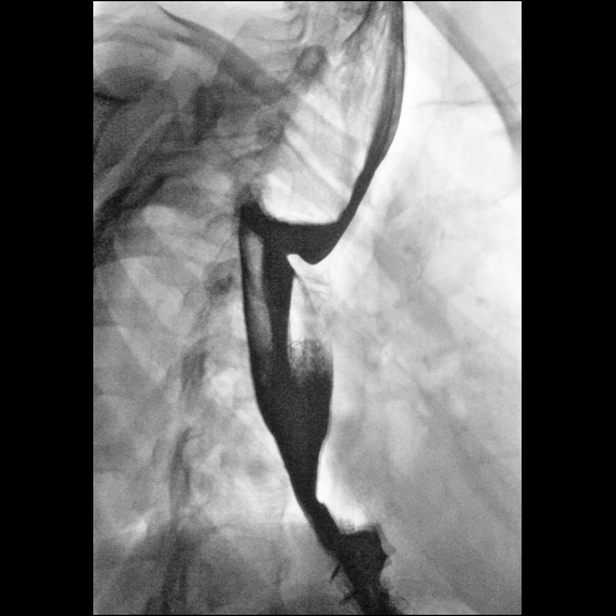

[Series 6: one shot · 0.15mm/px · 2 of 6 slices shown (1 of 2)]
[im 2/6]
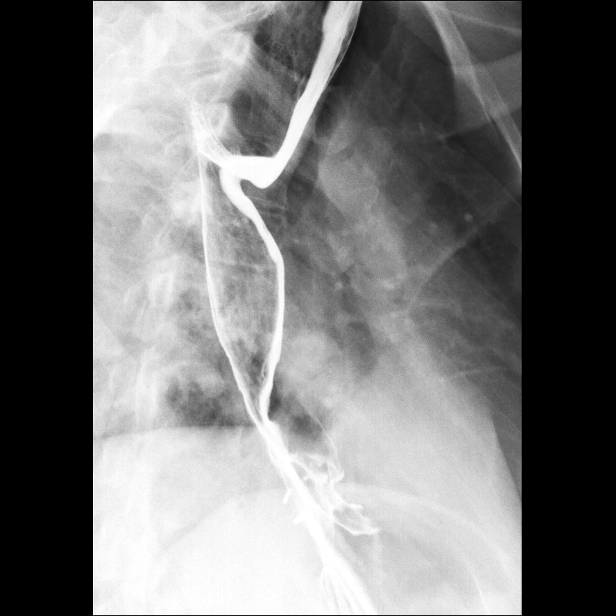
[im 6/6]
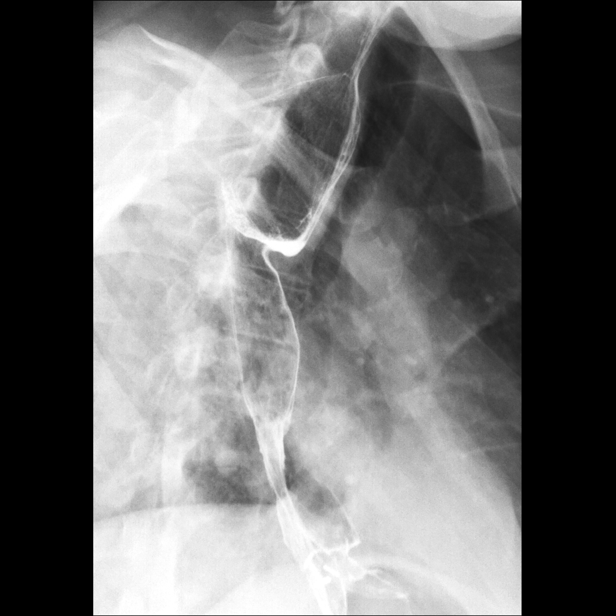

[Series 7: sequence · 1 of 50 frames shown (4 of 7)]
[frame 8/50]
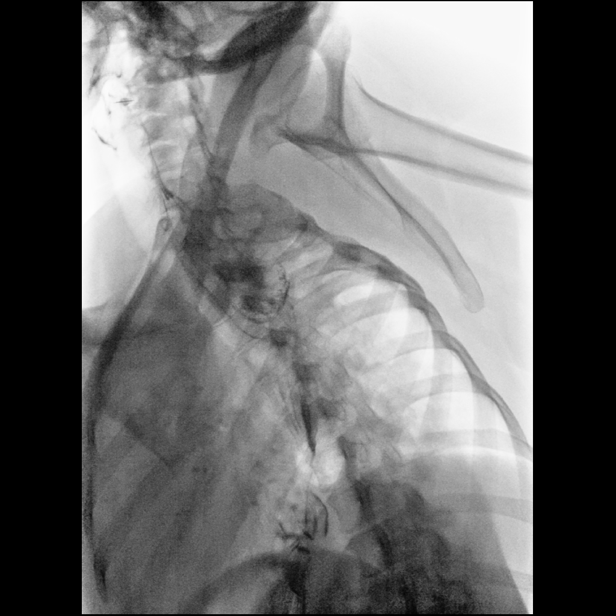

[Series 8: sequence · 2 of 129 frames shown (5 of 7)]
[frame 20/129]
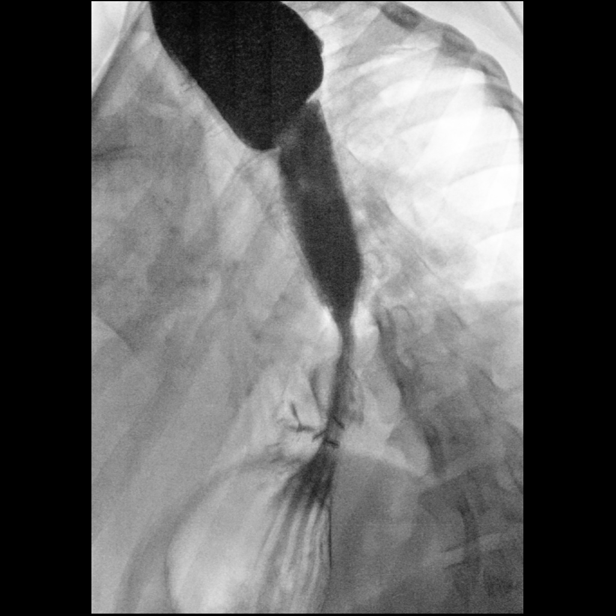
[frame 110/129]
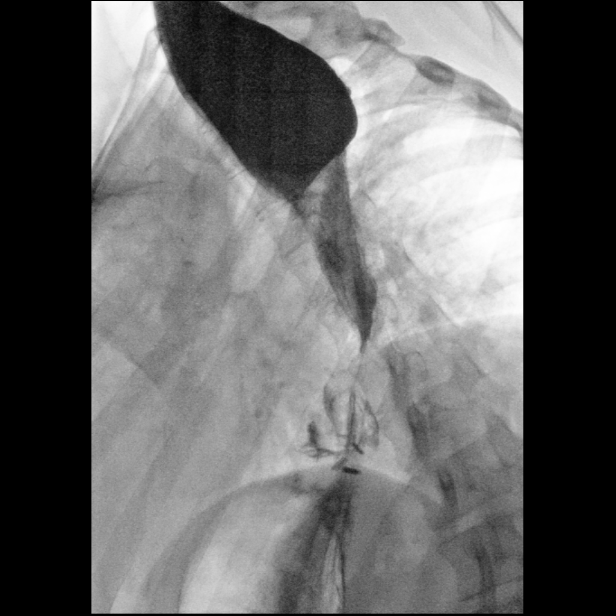

[Series 10: sequence · 2 of 5 frames shown (6 of 7)]
[frame 1/5]
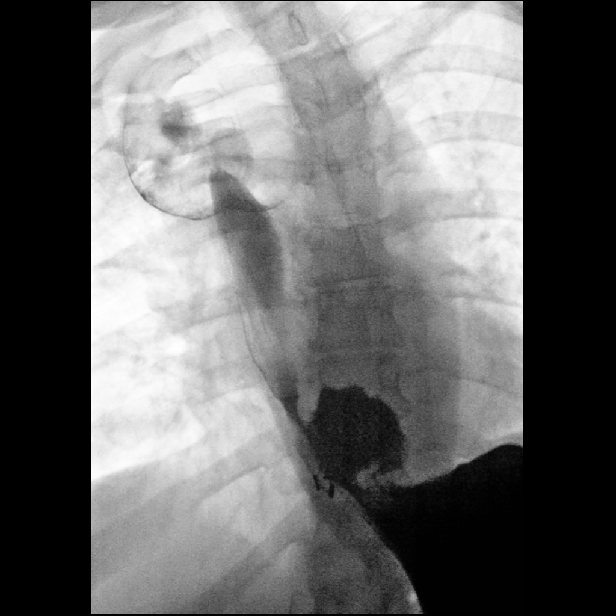
[frame 4/5]
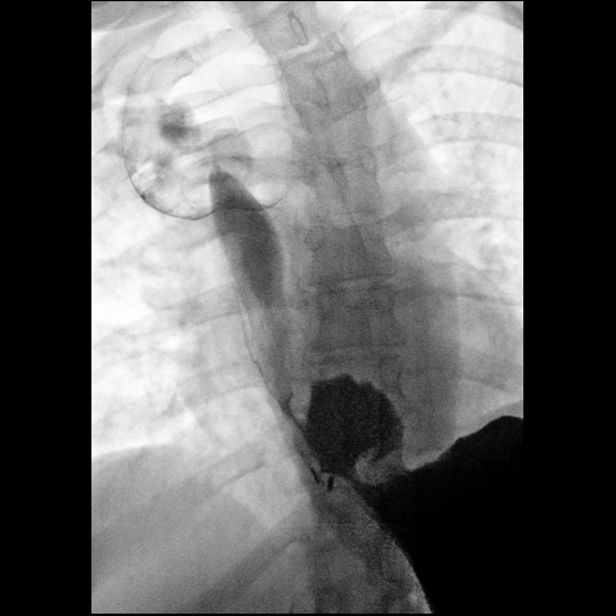

[Series 11: sequence · 1 of 61 frames shown (7 of 7)]
[frame 31/61]
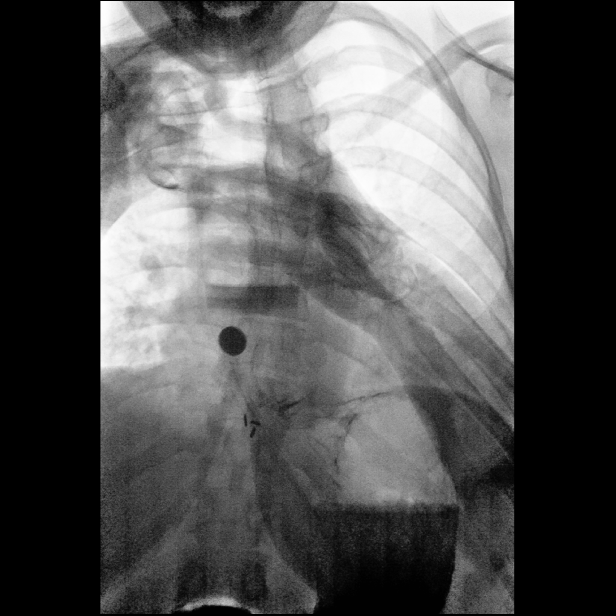

[Series 12: one shot · 1 of 1 slices shown (2 of 2)]
[im 1/1]
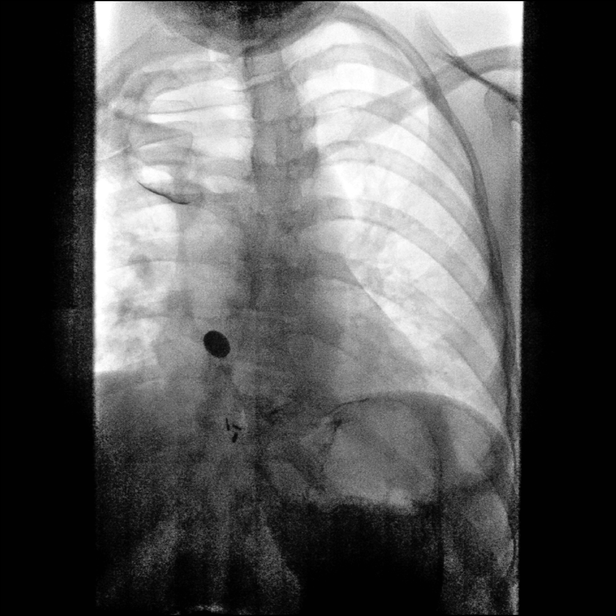

[14 of 24 positions shown; findings below may reference images not displayed]

FINDINGS: Normal oral and pharyngeal phases of swallowing, with no laryngeal
penetration or tracheobronchial aspiration. No significant barium
retention in the pharynx. No evidence of cricopharyngeus muscle
dysfunction. No appreciable pharyngeal mass, stricture or
diverticulum.

There are stable postsurgical changes in the mid to upper thoracic
esophagus with prominent dilation of the upper thoracic esophagus
and with stable puckered appearance at the surgical repair site,
with no significant stricture in this location. There is a small
hiatal hernia. Surgical clips are noted adjacent to the hiatal
hernia. Moderate gastroesophageal reflux observed spontaneously to
the level of mid to upper thoracic esophagus. There is marked
esophageal dysmotility, with significant weakening of primary
peristalsis throughout the thoracic esophagus. There is smooth
narrowing of the lower thoracic esophagus extending to the
esophagogastric junction, at which location the barium tablet became
lodged. No discrete mass or ulcer in the esophagus.
IMPRESSION: 1. Recurrent peptic stricture in the lower thoracic esophagus
adjacent to the esophagogastric junction. No discrete esophageal
mass.
2. Chronic postsurgical changes in the mid to upper thoracic
esophagus with prominent dilation of the upper thoracic esophagus,
with no significant stricture at the surgical repair site.
3. Small hiatal hernia. Moderate spontaneous gastroesophageal
reflux.
4. Marked esophageal dysmotility.
5. Normal oral and pharyngeal phases of swallowing.

## 2021-07-24 ENCOUNTER — Ambulatory Visit: Payer: Self-pay

## 2021-07-24 NOTE — Telephone Encounter (Signed)
Pt has questions and concerns about menstrual cycle. Cb# [phone]   ?  ? ?Chief Complaint: Irregular menstrual periods with pain. ?Symptoms: Pain, not sleeping at night. OTC Not helping. ?Frequency: Started 1-2 weeks ago ?Pertinent Negatives: Patient denies  ?Disposition: [] ED /[] Urgent Care (no appt availability in office) / [] Appointment(In office/virtual)/ []  [place] [facility]/ [] Home Care/ [] Refused Recommended Disposition /[] [place] [facility]/ []  Follow-up with PCP ?Additional Notes: Pt. Informed she could go to [facility]. Would like to be seen in practice as soon as possible. Please advise pt.  ?Answer Assessment - Initial Assessment Questions ?1. AMOUNT: "Describe the bleeding that you are having."  ?  - SPOTTING: spotting, or pinkish / brownish mucous discharge; does not fill panty liner or pad  ?  - MILD:  less than 1 pad / hour; less than patient's usual menstrual bleeding ?  - MODERATE: 1-2 pads / hour; 1 menstrual cup every 6 hours; small-medium blood clots (e.g., pea, grape, small coin) ?  - SEVERE: soaking 2 or more pads/hour for 2 or more hours; 1 menstrual cup every 2 hours; bleeding not contained by pads or continuous red blood from vagina; large blood clots (e.g., golf ball, large coin)  ?    Moderate ?2. ONSET: "When did the bleeding begin?" "Is it continuing now?" ?    1-2 weeks ago ?3. MENSTRUAL PERIOD: "When was the last normal menstrual period?" "How is this different than your period?" ?    Has been irregular ?4. REGULARITY: "How regular are your periods?" ?    Irregular ?5. ABDOMINAL PAIN: "Do you have any pain?" "How bad is the pain?"  (e.g., Scale 1-10; mild, moderate, or severe) ?  - MILD (1-3): doesn't interfere with normal activities, abdomen soft and not tender to touch  ?  - MODERATE (4-7): interferes with normal activities or awakens from sleep, abdomen tender to touch  ?  - SEVERE (8-10): excruciating pain, doubled over, unable to do any normal activities   ?    10 ?6. PREGNANCY: "Could you be pregnant?" "Are you sexually active?" "Did you recently give birth?" ?    No ?7. BREASTFEEDING: "Are you breastfeeding?" ?    No ?8. HORMONES: "Are you taking any hormone medications, prescription or OTC?" (e.g., birth control pills, estrogen) ?    No ?9. BLOOD THINNERS: "Do you take any blood thinners?" (e.g., Coumadin/warfarin, Pradaxa/dabigatran, aspirin) ?    No ?10. CAUSE: "What do you think is causing the bleeding?" (e.g., recent gyn surgery, recent gyn procedure; known bleeding disorder, cervical cancer, polycystic ovarian disease, fibroids)   ?      No ?11. HEMODYNAMIC STATUS: "Are you weak or feeling lightheaded?" If Yes, ask: "Can you stand and walk normally?"  ?      Feels tired ?12. OTHER SYMPTOMS: "What other symptoms are you having with the bleeding?" (e.g., passed tissue, vaginal discharge, fever, menstrual-type cramps) ?      No ? ?Protocols used: Vaginal Bleeding - Abnormal-A-AH ? ?

## 2021-07-25 ENCOUNTER — Ambulatory Visit
Admission: EM | Admit: 2021-07-25 | Discharge: 2021-07-25 | Disposition: A | Discharge status: Home / Self Care | Payer: Medicare HMO

## 2021-07-25 ENCOUNTER — Telehealth: Payer: Self-pay | Admitting: Nurse Practitioner

## 2021-07-25 ENCOUNTER — Encounter: Payer: Self-pay | Admitting: Emergency Medicine

## 2021-07-25 DIAGNOSIS — R1310 Dysphagia, unspecified: Secondary | ICD-10-CM | POA: Diagnosis not present

## 2021-07-25 NOTE — Telephone Encounter (Addendum)
Pt called back to follow up on triage call placed yesterday. Pt looking for appointment today.  I did give her the information about Cri being at [facility] and accepting walkins- 1st come 1st served.  Pt verbalized understanding and plans to head on over to that location to be seen today.  ?Pt declined to make any appointment at all at [facility].  It has been 2 yrs since she has been seen here.  I did give pt that information as well. ?

## 2021-07-25 NOTE — Discharge Instructions (Signed)
Please try to make an appointment with gastroenterology as soon as possible for further evaluation and management.  Please go to the hospital if you are unable to swallow your own spit or liquids. ?

## 2021-07-25 NOTE — Telephone Encounter (Signed)
Left message on voicemail.  ?Attempting to get patient set up for appt at [facility].  ?

## 2021-07-25 NOTE — ED Triage Notes (Signed)
Hx of having esophagus dilated, hasn't had procedure done since 2015. States that since the start of this year she's had increasingly difficult swallowing and has started to lose weight.  ?

## 2021-07-25 NOTE — ED Provider Notes (Signed)
[facility]    CSN: [id number] Arrival date & time: 07/25/21  1240      History   Chief Complaint Chief Complaint  Patient presents with   Dysphagia    HPI Melissa Dougherty is a 51 y.o. female.   Patient presents with difficulty swallowing that has been present for approximately 2 months.  Patient reports that she has a history of having to have her esophagus dilated in 2015.  Reports that she has been losing some weight since difficulty swallowing occurred due to not being able to eat normal foods.  She reports that she is still able to swallow her sputum and liquids but has difficulty with foods such as sausages.  Is not able to enumerate the amount of weight that she has lost but states that it is "less than 50 pounds".  Denies any chest pain or shortness of breath.  Patient has an appointment with gastroenterology on March 28 for this issue.    Past Medical History:  Diagnosis Date   Alcohol abuse    Allergy    Bipolar disorder ([place])    Depression    Drug abuse ([place])    GERD (gastroesophageal reflux disease)    Heart murmur    Hypertension    Seizures ([place])    Last seizure was in 1989    Patient Active Problem List   Diagnosis Date Noted   Urge incontinence 07/18/2019   Menorrhagia with irregular cycle 06/16/2019   Bipolar 1 disorder, depressed, severe ([place]) 01/31/2019   Sensorineural hearing loss (SNHL) of both ears 08/09/2018   Increased storage iron 02/18/2016   Neuropathy 02/18/2016   Otitis media 02/18/2016   Fatigue 02/07/2016   Constipation 02/07/2016   Headache 12/25/2015   Routine general medical examination at a health care facility 11/01/2015   Medicare welcome exam 11/01/2015   Abdominal bloating 10/16/2015   Bipolar 1 disorder, mixed ([place]) 01/10/2014   Bipolar I disorder, most recent episode (or current) mixed, moderate 12/24/2013   Adjustment disorder with mixed anxiety and depressed mood 12/23/2013   MDD (major depressive disorder)  12/23/2013   Depressive disorder 12/22/2013   DEPRESSION 02/17/2007   GERD 02/17/2007   SEIZURE DISORDER, HX OF 02/17/2007    Past Surgical History:  Procedure Laterality Date   BACK SURGERY     ESOPHAGOPLASTY     TUBAL LIGATION      OB History     Gravida  2   Para  2   Term  2   Preterm      AB      Living  2      SAB      IAB      Ectopic      Multiple      Live Births  2            Home Medications    Prior to Admission medications   Medication Sig Start Date End Date Taking? Authorizing Provider  FLUoxetine (PROZAC) 20 MG capsule Take 1 capsule (20 mg total) by mouth daily. Patient not taking: Reported on 07/27/2019 02/04/19   Johnn Hai, MD  hydrochlorothiazide (MICROZIDE) 12.5 MG capsule Take 12.5 mg by mouth daily. 06/24/19   [provider]  INVEGA SUSTENNA 234 MG/1.5ML SUSY injection Inject 1.5 mLs as directed every 30 (thirty) days.  01/18/19   [provider]  LOTEMAX SM 0.38 % GEL Place 1 drop into the right eye 3 (three) times daily. 03/17/19  [provider]  naproxen (NAPROSYN) 500 MG tablet Take 1 tablet (500 mg total) by mouth 2 (two) times daily. Patient not taking: Reported on 07/27/2019 05/31/19   Raylene Everts, MD  perphenazine (TRILAFON) 2 MG tablet Take 1 tablet (2 mg total) by mouth at bedtime. Patient not taking: Reported on 07/27/2019 02/03/19   Johnn Hai, MD  PROLENSA 0.07 % SOLN Place 1 drop into the right eye daily. 03/17/19   [provider]  temazepam (RESTORIL) 15 MG capsule Take 1 capsule (15 mg total) by mouth at bedtime as needed for sleep. Patient not taking: Reported on 07/27/2019 02/03/19   Johnn Hai, MD  traZODone (DESYREL) 50 MG tablet Take 100 mg by mouth at bedtime.     [provider]  benztropine (COGENTIN) 0.5 MG tablet Take 0.5 mg by mouth at bedtime. 01/17/19 05/31/19  [provider]  divalproex (DEPAKOTE SPRINKLE) 125 MG capsule Take 1 capsule (125 mg  total) by mouth every 12 (twelve) hours. 02/03/19 05/31/19  Johnn Hai, MD  omeprazole (PRILOSEC) 20 MG capsule Take 20 mg by mouth 2 (two) times daily before a meal.  03/03/19 05/31/19  [provider]  rizatriptan (MAXALT) 10 MG tablet Take 1 tablet by mouth at the onset of headache. May repeat in 2 hours if needed Patient not taking: Reported on 01/31/2019 01/02/16 05/31/19  Gold[place] Circle, FNP    Family History Family History  Problem Relation Age of Onset   Diabetes Mother    Stroke Mother    Alcohol abuse Father    Diabetes Maternal Grandmother    Hypertension Maternal Grandmother    Breast cancer Maternal Aunt     Social History Social History   Tobacco Use   Smoking status: Never   Smokeless tobacco: Never  Vaping Use   Vaping Use: Never used  Substance Use Topics   Alcohol use: Not Currently   Drug use: Not Currently    Comment: clean x 5 years     Allergies   Patient has no known allergies.   Review of Systems Review of Systems Per HPI  Physical Exam Triage Vital Signs ED Triage Vitals  Enc Vitals Group     BP 07/25/21 1247 135/85     Pulse Rate 07/25/21 1247 85     Resp 07/25/21 1247 16     Temp 07/25/21 1247 97.6 F (36.4 C)     Temp Source 07/25/21 1247 Oral     SpO2 07/25/21 1247 98 %     Weight --      Height --      Head Circumference --      Peak Flow --      Pain Score 07/25/21 1249 0     Pain Loc --      Pain Edu? --      Excl. in [place]? --    No data found.  Updated Vital Signs BP 135/85 (BP Location: Left Arm)    Pulse 85    Temp 97.6 F (36.4 C) (Oral)    Resp 16    SpO2 98%   Visual Acuity Right Eye Distance:   Left Eye Distance:   Bilateral Distance:    Right Eye Near:   Left Eye Near:    Bilateral Near:     Physical Exam Constitutional:      General: She is not in acute distress.    Appearance: Normal appearance. She is not toxic-appearing or diaphoretic.  HENT:  Head: Normocephalic and atraumatic.      Mouth/Throat:     Lips: Pink.     Mouth: Mucous membranes are moist.     Pharynx: No pharyngeal swelling.     Tonsils: No tonsillar exudate.  Eyes:     Extraocular Movements: Extraocular movements intact.     Conjunctiva/sclera: Conjunctivae normal.  Cardiovascular:     Rate and Rhythm: Normal rate and regular rhythm.     Pulses: Normal pulses.     Heart sounds: Normal heart sounds.  Pulmonary:     Effort: Pulmonary effort is normal. No respiratory distress.     Breath sounds: Normal breath sounds. No wheezing.  Neurological:     General: No focal deficit present.     Mental Status: She is alert and oriented to person, place, and time. Mental status is at baseline.  Psychiatric:        Mood and Affect: Mood normal.        Behavior: Behavior normal.        Thought Content: Thought content normal.        Judgment: Judgment normal.     UC Treatments / Results  Labs (all labs ordered are listed, but only abnormal results are displayed) Labs Reviewed - No data to display  EKG   Radiology No results found.  Procedures Procedures (including critical care time)  Medications Ordered in UC Medications - No data to display  Initial Impression / Assessment and Plan / UC Course  I have reviewed the triage vital signs and the nursing notes.  Pertinent labs & imaging results that were available during my care of the patient were reviewed by me and considered in my medical decision making (see chart for details).     Due to patient being able to swallow liquids and her sputum, I do not think that patient is in need of immediate medical attention at the hospital at this time.  Close and prompt follow-up with gastrointestinal specialist is necessary.  Patient has appointment on March 28 but the patient was provided with a different contact information for gastroenterology to see if she was able to get in sooner for further evaluation and management.  Patient encouraged to drink  nutrition drinks to help with nutrition since she has been losing weight in the meantime.  Discussed strict return and ER precautions.  Patient verbalized understanding and was agreeable with plan. Final Clinical Impressions(s) / UC Diagnoses   Final diagnoses:  Dysphagia, unspecified type     Discharge Instructions      Please try to make an appointment with gastroenterology as soon as possible for further evaluation and management.  Please go to the hospital if you are unable to swallow your own spit or liquids.    ED Prescriptions   None    PDMP not reviewed this encounter.   Teodora Medici, [place] 07/25/21 1323

## 2021-07-26 ENCOUNTER — Ambulatory Visit: Payer: Medicare HMO | Admitting: Gastroenterology

## 2021-07-29 ENCOUNTER — Telehealth: Payer: Self-pay

## 2021-07-29 NOTE — Telephone Encounter (Signed)
Called and spoke to pt she sad the bleeding stopped Friday and is feeling much better. ?

## 2021-07-29 NOTE — Telephone Encounter (Signed)
Patient returning call.

## 2021-07-29 NOTE — Telephone Encounter (Signed)
Left message to return call to our office.  

## 2021-09-11 ENCOUNTER — Ambulatory Visit: Payer: Medicare HMO

## 2021-09-17 ENCOUNTER — Ambulatory Visit (HOSPITAL_COMMUNITY)
Admission: EM | Admit: 2021-09-17 | Discharge: 2021-09-17 | Disposition: A | Discharge status: Home / Self Care | Payer: Medicare HMO

## 2021-09-17 NOTE — Progress Notes (Signed)
?   09/17/21 1055  ?[place] Triage Screening (Walk-ins at [facility] only)  ?How Did You Hear About [place]? Legal System  ?What Is the Reason for Your Visit/Call Today? Patient presents voluntarily via [employer] after her friend/partner, with whom she lives, called police to express concerns.  Patient's partner, Denyse Amass, is in the hospital for a foot surgery and patient has been struggling to function with him out of the home.  She shares his family is at the hospital "looking after him and I'm going to be going to [place] to get my GED and take care of me."  Patient is not sure why Denyse Amass would have called police today.  She denies concerns and she denies SI, HI, AVH or recent substance use.  Per officers, patient's roommate was sharing she was out in the complex parking lot moving small piles of her belongings around in parking log.   She admits to hx of SA, however she has been clean/sober from alcohol for "years."  Patient is engaged in outpt services with [employer].  She gave verbal consent for [facility] to contact [facility], clinician, with [facility].  Brayton Layman was in a meeting, however admin staff confirmed she did present yesterday and completed her assessment.  She will be starting the [facility] program(Psychosocial Rehabilitation Services) for vocational training and support in Day Treatment setting. She will start soon, and Brayton Layman may be able to get her in sooner before auth. clears.  Patient appears to be struggling with caring for herself now that her partner is in the hospital. She denies concerns and states she plans to go to the hospital to see Denyse Amass when she leaves.  ?How Long Has This Been Causing You Problems? 1 wk - 1 month  ?Have You Recently Had Any Thoughts About Hurting Yourself? No  ?Are You Planning to Commit Suicide/Harm Yourself At This time? No  ?Have you Recently Had Thoughts About [place]? No  ?Are You Planning To Harm Someone At This Time? No  ?Are you currently experiencing any  auditory, visual or other hallucinations? No  ?Have You Used Any Alcohol or Drugs in the Past 24 Hours? No  ?Do you have any current medical co-morbidities that require immediate attention? No  ?Clinician description of patient physical appearance/behavior: Patient is somewhat disorganized, however is pleasant and cooperative.  AAOx4  ?What Do You Feel Would Help You the Most Today? Treatment for Depression or other mood problem  ?If access to [facility] was not available, would you have sought care in the Emergency Department? No  ?Determination of Need Routine (7 days)  ?Options For Referral Medication Management;Outpatient Therapy;Other: Comment ?(Psychosocial Rehab with [facility] to begin soon.)  ? ? ?

## 2021-09-17 NOTE — ED Provider Notes (Signed)
Behavioral Health Urgent Care Medical Screening Exam ? ?Patient Name: Melissa Dougherty ?MRN: [id number] ?Date of Evaluation: 09/17/21 ?Chief Complaint:   ?Diagnosis:  ?Final diagnoses:  ?None  ? ? ?History of Present illness: Melissa Dougherty is a 51 y.o. female.  She presented to [facility] via [employer] for bizarre behavior.  Per [employer] - patient's roommate was sharing she was out in the complex parking lot moving small piles of her belongings around in parking log. ? ?When asked why she was here she states she does not know.  She reports that her partner Melissa Dougherty is currently in the hospital, when asked why she states that he is in his 19's and has multiple health issues (had foot surgery).  She states she is currently under some stress because she has been studying for her GED test next Monday.  She reports she simply wants to go to [employer].   ? ?She reports no past psychiatric diagnosis' (per chart review Bipolar Disorder).  She reports substance abuse in the past (Cocaine, EtOH, and Crack) but has not used in years.  She reports not currently being on medications but she has in the past (years ago) but does not remember the names.  She reports she has been psychiatrically hospitalized in the past (years ago).  She reports no known family psychiatric history.  She reports a PMH of Epilepsy but has not had a seizure in years. ? ?She reports that she currently lives with her partner Melissa Dougherty.  She reports that she has been on disability for most of her life due to "Learning Disability."  She reports having 1 son and 1 daughter who live in [place].  She reports last drink and substance use was years ago.  She reports no tobacco use.   ? ?She reports no symptoms of depression, Mania, Psychosis, and no history of trauma.  She reports some anxiety due to her upcoming GED test but otherwise denies anxiety. ? ?She reports the only thing she wants is to go to [employer].  She reports no SI, HI, or AVH. ? ? ?With LCSW  [employer].  They report that patient came in to the center yesterday to restart services with them.  They report that patient will get services from them and then will stop showing up (sometimes for a few years at a time) and then restart. They report that the patient came to them yesterday to restart with the program and was planning to restart the Day Program and would be restarting medication with them.  They report they would be able to have the patient attend programming today and have lunch there if wanted.  They would be able to provide transportation from [facility] to the facility and then home again.  They reported they will send transport to pick the patient up. ? ? ? ?Psychiatric Specialty Exam ? ?Presentation  ?General Appearance:Casual ? ?Eye Contact:Fair ? ?Speech:Clear and Coherent; Normal Rate ? ?Speech Volume:Normal ? ?Handedness:Left ? ? ?Mood and Affect  ?Mood:Euthymic ?Affect:Appropriate; Congruent ? ?Thought Process  ?Thought Processes:Disorganized (appears to be baseline) ?Descriptions of Associations:Intact ? ?Orientation:Full (Time, Place and Person) ? ?Thought Content:Tangential ?No SI, HI, or AVH. No Paranoia, Ideas of Reference, or First Rank symptoms. ?   ?Hallucinations:None ? ?Ideas of Reference:None ? ?Suicidal Thoughts:No ? ?Homicidal Thoughts:No ? ? ?Sensorium  ?Memory:Immediate Fair; Recent Fair ?Judgment:Intact ?Insight:Present ? ?Executive Functions  ?Concentration:Fair ?Attention Span:Fair ?Recall:Fair ?[place] ?Language:Fair ? ?Psychomotor Activity  ?Psychomotor Activity:Normal ? ?  Assets  ?Assets:Communication Skills; Desire for Improvement; Financial Resources/Insurance; Physical Health ? ?Sleep  ?Sleep:Fair ? ? ? ?Physical Exam: ?Physical Exam ?Constitutional:   ?   General: She is not in acute distress. ?   Appearance: Normal appearance. She is normal weight. She is not ill-appearing or toxic-appearing.  ?HENT:  ?   Head: Normocephalic and  atraumatic.  ?Pulmonary:  ?   Effort: Pulmonary effort is normal.  ?Musculoskeletal:     ?   General: Normal range of motion.  ?Neurological:  ?   General: No focal deficit present.  ?   Mental Status: She is alert.  ? ?Review of Systems  ?Respiratory:  Negative for cough and shortness of breath.   ?Cardiovascular:  Negative for chest pain.  ?Gastrointestinal:  Negative for abdominal pain, constipation, diarrhea, nausea and vomiting.  ?Neurological:  Negative for dizziness, weakness and headaches.  ?Psychiatric/Behavioral:  Negative for depression, hallucinations, substance abuse and suicidal ideas. The patient is nervous/anxious (about GED test on Monday).   ?Blood pressure (!) 122/93, pulse (!) 133, temperature 98.4 ?F (36.9 ?C), temperature source Oral, resp. rate 20, height '5\' 3"'$  (1.6 m), weight 110 lb (49.9 kg), SpO2 97 %. Body mass index is 19.49 kg/m?. ? ?Musculoskeletal: ?Strength & Muscle Tone: within normal limits ?Gait & Station: normal ?Patient leans: N/A ? ? ?[facility] MSE Discharge Disposition for Follow up and Recommendations: ?Based on my evaluation the patient does not appear to have an emergency medical condition and can be discharged with resources and follow up care in outpatient services for Day Program at [facility]. ?Patient does have diagnosis of Bipolar Disorder but is not currently showing any signs of Mania.  She does have bizarre behavior but per [facility] whenever her relationship is strained she worsens and this seems to be around her baseline.   [facility] able to get her into the day program today and she is reporting no SI, HI, or AVH so we will discharge her and [facility] will pick her up and take her to their facility. ? ? ?Briant Cedar, MD ?09/17/2021, 5:15 PM ? ?

## 2021-09-18 ENCOUNTER — Ambulatory Visit (HOSPITAL_COMMUNITY)
Admission: EM | Admit: 2021-09-18 | Discharge: 2021-09-18 | Disposition: A | Discharge status: Home / Self Care | Payer: Medicare HMO

## 2021-09-18 DIAGNOSIS — F4322 Adjustment disorder with anxiety: Secondary | ICD-10-CM

## 2021-09-18 NOTE — ED Provider Notes (Signed)
Behavioral Health Urgent Care Medical Screening Exam ? ?Patient Name: Melissa Dougherty ?MRN: [id number] ?Date of Evaluation: 09/18/21 ?Chief Complaint:   ?Diagnosis:  ?Final diagnoses:  ?Adjustment disorder with anxious mood  ? ? ?History of Present illness: Melissa Dougherty is a 51 y.o. female. Patient presents voluntarily to [facility] behavioral health for walk-in assessment.  Patient is voluntary, transported by Event organiser.  ? ?Melissa Dougherty reports she engaged in a verbal altercation with female roommate earlier today resulting in a neighbor telephoning police after hearing yelling.  She plan to present to [facility] today as they are currently working to establish an outpatient psychiatry as well as primary care provider appointment on her behalf. ? ?She has been diagnosed with bipolar 1 disorder, major depressive disorder and adjustment disorder.  She is followed outpatient at [place], [place].  She has not taken her medications for at least, several days.  She is not linked with outpatient counseling at this time.  She endorses history of previous inpatient psychiatric hospitalizations.  No family mental health history reported.  She is a limited historian at this time. ? ?Recent stressors include her significant other, who is currently hospitalized, awaiting a surgery on his foot.  Melissa Dougherty does not tolerate female roommate, particularly when her significant other is not in the home. ? ?Patient is assessed face-to-face by nurse practitioner.  She is seated in assessment area, no acute distress.  She is alert and oriented, pleasant and cooperative during assessment.  ?She presents with anxious mood, labile affect. She denies suicidal and homicidal ideations.  She denies history of suicide attempts, denies history of self-harm.  She contracts verbally for safety with this Probation officer. ? ? She denies both auditory and visual hallucinations.  Patient is able to converse coherently with goal-directed  thoughts, becomes frustrated when this writer does not understand instantly what she is saying. She denies paranoia.  Objectively there is no evidence of psychosis/mania or delusional thinking. ? ?[name] resides in [place] with her significant other and a female roommate.  She denies access to weapons.  She receives disability benefits.  She denies alcohol and substance use.  Patient endorses average sleep and appetite. ? ?Patient offered support and encouragement.  She gives verbal consent to speak with her mother, Melissa Dougherty phone number [phone].  Patient's mother agrees with plan for patient to discharge and follow-up with [facility]. ? ? ?Psychiatric Specialty Exam ? ?Presentation  ?General Appearance:Appropriate for Environment; Casual ? ?Eye Contact:Fair ? ?Speech:Clear and Coherent ? ?Speech Volume:Increased ? ?Handedness:Right ? ? ?Mood and Affect  ?Mood:Anxious ? ?Affect:Congruent ? ? ?Thought Process  ?Thought Processes:Goal Directed; Coherent ? ?Descriptions of Associations:Intact ? ?Orientation:Full (Time, Place and Person) ? ?Thought Content:Logical ?   Hallucinations:None ? ?Ideas of Reference:None ? ?Suicidal Thoughts:No ? ?Homicidal Thoughts:No ? ? ?Sensorium  ?Memory:Immediate Fair ? ?Judgment:Intact ? ?Insight:Present ? ? ?Executive Functions  ?Concentration:Fair ? ?Attention Span:Good ? ?Recall:Good ? ?[place] ? ?Language:Fair ? ? ?Psychomotor Activity  ?Psychomotor Activity:Normal ? ? ?Assets  ?Assets:Communication Skills; Desire for Improvement; Housing; Catering manager; Leisure Time; Intimacy; Resilience; Physical Health; Social Support ? ? ?Sleep  ?Sleep:Fair ? ?Number of hours: No data recorded ? ?No data recorded ? ?Physical Exam: ?Physical Exam ?Vitals and nursing note reviewed.  ?Constitutional:   ?   Appearance: Normal appearance. She is well-developed and normal weight.  ?HENT:  ?   Head: Normocephalic and atraumatic.  ?   Nose: Nose normal.   ?Cardiovascular:  ?   Rate and  Rhythm: Normal rate.  ?Pulmonary:  ?   Effort: Pulmonary effort is normal.  ?Musculoskeletal:     ?   General: Normal range of motion.  ?   Cervical back: Normal range of motion.  ?Skin: ?   General: Skin is warm and dry.  ?Neurological:  ?   Mental Status: She is alert and oriented to person, place, and time.  ?Psychiatric:     ?   Attention and Perception: Attention and perception normal.     ?   Mood and Affect: Mood is anxious. Affect is labile.     ?   Speech: Speech normal.     ?   Behavior: Behavior normal.     ?   Thought Content: Thought content normal.  ? ?Review of Systems  ?Constitutional: Negative.   ?HENT: Negative.    ?Eyes: Negative.   ?Respiratory: Negative.    ?Cardiovascular: Negative.   ?Gastrointestinal: Negative.   ?Genitourinary: Negative.   ?Musculoskeletal: Negative.   ?Skin: Negative.   ?Neurological: Negative.   ?Endo/Heme/Allergies: Negative.   ?Psychiatric/Behavioral:  The patient is nervous/anxious.   ?Blood pressure 137/89, pulse (!) 115, temperature 97.9 ?F (36.6 ?C), temperature source Oral, resp. rate 18, SpO2 100 %. There is no height or weight on file to calculate BMI. ? ?Musculoskeletal: ?Strength & Muscle Tone: within normal limits ?Gait & Station: normal ?Patient leans: N/A ? ? ?[facility] MSE Discharge Disposition for Follow up and Recommendations: ?Based on my evaluation the patient does not appear to have an emergency medical condition and can be discharged with resources and follow up care in outpatient services for Medication Management and Individual Therapy ?Patient reviewed with Dr Serafina Mitchell. ?Follow up with outpatient psychiatry, resources provided. ?[facility] contacted by police, after patient requested transportation to facility. [facility] accepts patient, Event organiser transported [place] directly to [facility].  ? ?Lucky Rathke, FNP ?09/18/2021, 12:16 PM ? ?

## 2021-09-18 NOTE — Discharge Instructions (Signed)

## 2021-09-19 DIAGNOSIS — E876 Hypokalemia: Secondary | ICD-10-CM | POA: Diagnosis not present

## 2021-09-19 DIAGNOSIS — M79644 Pain in right finger(s): Secondary | ICD-10-CM | POA: Diagnosis not present

## 2021-09-19 DIAGNOSIS — F312 Bipolar disorder, current episode manic severe with psychotic features: Secondary | ICD-10-CM | POA: Diagnosis not present

## 2021-09-19 DIAGNOSIS — Z91148 Patient's other noncompliance with medication regimen for other reason: Secondary | ICD-10-CM | POA: Diagnosis not present

## 2021-09-19 DIAGNOSIS — T426X1A Poisoning by other antiepileptic and sedative-hypnotic drugs, accidental (unintentional), initial encounter: Secondary | ICD-10-CM | POA: Diagnosis not present

## 2021-09-19 DIAGNOSIS — L02511 Cutaneous abscess of right hand: Secondary | ICD-10-CM | POA: Diagnosis not present

## 2021-09-19 DIAGNOSIS — Z20822 Contact with and (suspected) exposure to covid-19: Secondary | ICD-10-CM | POA: Diagnosis not present

## 2021-09-19 DIAGNOSIS — R4182 Altered mental status, unspecified: Secondary | ICD-10-CM | POA: Diagnosis not present

## 2021-09-19 DIAGNOSIS — E7220 Disorder of urea cycle metabolism, unspecified: Secondary | ICD-10-CM | POA: Diagnosis not present

## 2021-09-19 DIAGNOSIS — R451 Restlessness and agitation: Secondary | ICD-10-CM | POA: Diagnosis not present

## 2021-09-19 DIAGNOSIS — S60031A Contusion of right middle finger without damage to nail, initial encounter: Secondary | ICD-10-CM | POA: Diagnosis not present

## 2021-09-19 DIAGNOSIS — R000 Tachycardia, unspecified: Secondary | ICD-10-CM | POA: Diagnosis not present

## 2021-09-19 DIAGNOSIS — M868X4 Other osteomyelitis, hand: Secondary | ICD-10-CM | POA: Diagnosis not present

## 2021-09-19 DIAGNOSIS — L03011 Cellulitis of right finger: Secondary | ICD-10-CM | POA: Diagnosis not present

## 2021-09-19 DIAGNOSIS — F319 Bipolar disorder, unspecified: Secondary | ICD-10-CM | POA: Diagnosis not present

## 2021-09-19 DIAGNOSIS — D649 Anemia, unspecified: Secondary | ICD-10-CM | POA: Diagnosis not present

## 2021-09-19 DIAGNOSIS — Z1624 Resistance to multiple antibiotics: Secondary | ICD-10-CM | POA: Diagnosis not present

## 2021-09-19 DIAGNOSIS — M7989 Other specified soft tissue disorders: Secondary | ICD-10-CM | POA: Diagnosis not present

## 2021-09-20 DIAGNOSIS — R000 Tachycardia, unspecified: Secondary | ICD-10-CM | POA: Diagnosis not present

## 2021-09-20 DIAGNOSIS — F312 Bipolar disorder, current episode manic severe with psychotic features: Secondary | ICD-10-CM | POA: Diagnosis not present

## 2021-09-21 DIAGNOSIS — F312 Bipolar disorder, current episode manic severe with psychotic features: Secondary | ICD-10-CM | POA: Diagnosis not present

## 2021-09-22 DIAGNOSIS — F312 Bipolar disorder, current episode manic severe with psychotic features: Secondary | ICD-10-CM | POA: Diagnosis not present

## 2021-09-23 DIAGNOSIS — Z91148 Patient's other noncompliance with medication regimen for other reason: Secondary | ICD-10-CM | POA: Diagnosis not present

## 2021-09-23 DIAGNOSIS — M7989 Other specified soft tissue disorders: Secondary | ICD-10-CM | POA: Diagnosis not present

## 2021-09-23 DIAGNOSIS — F312 Bipolar disorder, current episode manic severe with psychotic features: Secondary | ICD-10-CM | POA: Diagnosis not present

## 2021-09-23 DIAGNOSIS — M79644 Pain in right finger(s): Secondary | ICD-10-CM | POA: Diagnosis not present

## 2021-09-24 DIAGNOSIS — F312 Bipolar disorder, current episode manic severe with psychotic features: Secondary | ICD-10-CM | POA: Diagnosis not present

## 2021-09-24 DIAGNOSIS — Z91148 Patient's other noncompliance with medication regimen for other reason: Secondary | ICD-10-CM | POA: Diagnosis not present

## 2021-09-25 DIAGNOSIS — L03011 Cellulitis of right finger: Secondary | ICD-10-CM | POA: Diagnosis not present

## 2021-09-25 DIAGNOSIS — Z91148 Patient's other noncompliance with medication regimen for other reason: Secondary | ICD-10-CM | POA: Diagnosis not present

## 2021-09-25 DIAGNOSIS — F312 Bipolar disorder, current episode manic severe with psychotic features: Secondary | ICD-10-CM | POA: Diagnosis not present

## 2021-09-26 DIAGNOSIS — R000 Tachycardia, unspecified: Secondary | ICD-10-CM | POA: Diagnosis not present

## 2021-09-26 DIAGNOSIS — M868X4 Other osteomyelitis, hand: Secondary | ICD-10-CM | POA: Diagnosis not present

## 2021-09-26 DIAGNOSIS — L02511 Cutaneous abscess of right hand: Secondary | ICD-10-CM | POA: Diagnosis not present

## 2021-09-26 DIAGNOSIS — F312 Bipolar disorder, current episode manic severe with psychotic features: Secondary | ICD-10-CM | POA: Diagnosis not present

## 2021-09-27 DIAGNOSIS — L02511 Cutaneous abscess of right hand: Secondary | ICD-10-CM | POA: Diagnosis not present

## 2021-09-27 DIAGNOSIS — F312 Bipolar disorder, current episode manic severe with psychotic features: Secondary | ICD-10-CM | POA: Diagnosis not present

## 2021-09-27 DIAGNOSIS — Z91148 Patient's other noncompliance with medication regimen for other reason: Secondary | ICD-10-CM | POA: Diagnosis not present

## 2021-09-27 DIAGNOSIS — M868X4 Other osteomyelitis, hand: Secondary | ICD-10-CM | POA: Diagnosis not present

## 2021-09-27 DIAGNOSIS — R000 Tachycardia, unspecified: Secondary | ICD-10-CM | POA: Diagnosis not present

## 2021-09-28 DIAGNOSIS — M868X4 Other osteomyelitis, hand: Secondary | ICD-10-CM | POA: Diagnosis not present

## 2021-09-28 DIAGNOSIS — L02511 Cutaneous abscess of right hand: Secondary | ICD-10-CM | POA: Diagnosis not present

## 2021-09-28 DIAGNOSIS — F312 Bipolar disorder, current episode manic severe with psychotic features: Secondary | ICD-10-CM | POA: Diagnosis not present

## 2021-09-28 DIAGNOSIS — Z91148 Patient's other noncompliance with medication regimen for other reason: Secondary | ICD-10-CM | POA: Diagnosis not present

## 2021-09-28 DIAGNOSIS — R000 Tachycardia, unspecified: Secondary | ICD-10-CM | POA: Diagnosis not present

## 2021-09-29 DIAGNOSIS — F312 Bipolar disorder, current episode manic severe with psychotic features: Secondary | ICD-10-CM | POA: Diagnosis not present

## 2021-09-30 DIAGNOSIS — F312 Bipolar disorder, current episode manic severe with psychotic features: Secondary | ICD-10-CM | POA: Diagnosis not present

## 2021-09-30 DIAGNOSIS — T426X1A Poisoning by other antiepileptic and sedative-hypnotic drugs, accidental (unintentional), initial encounter: Secondary | ICD-10-CM | POA: Diagnosis not present

## 2021-09-30 DIAGNOSIS — Z91148 Patient's other noncompliance with medication regimen for other reason: Secondary | ICD-10-CM | POA: Diagnosis not present

## 2021-09-30 DIAGNOSIS — R4182 Altered mental status, unspecified: Secondary | ICD-10-CM | POA: Diagnosis not present

## 2021-09-30 DIAGNOSIS — M869 Osteomyelitis, unspecified: Secondary | ICD-10-CM

## 2021-09-30 DIAGNOSIS — E7220 Disorder of urea cycle metabolism, unspecified: Secondary | ICD-10-CM | POA: Diagnosis not present

## 2021-09-30 HISTORY — DX: Osteomyelitis, unspecified: M86.9

## 2021-10-01 DIAGNOSIS — R4182 Altered mental status, unspecified: Secondary | ICD-10-CM | POA: Diagnosis not present

## 2021-10-01 DIAGNOSIS — T426X1A Poisoning by other antiepileptic and sedative-hypnotic drugs, accidental (unintentional), initial encounter: Secondary | ICD-10-CM | POA: Diagnosis not present

## 2021-10-01 DIAGNOSIS — E7220 Disorder of urea cycle metabolism, unspecified: Secondary | ICD-10-CM | POA: Diagnosis not present

## 2021-10-01 DIAGNOSIS — Z91148 Patient's other noncompliance with medication regimen for other reason: Secondary | ICD-10-CM | POA: Diagnosis not present

## 2021-10-01 DIAGNOSIS — F312 Bipolar disorder, current episode manic severe with psychotic features: Secondary | ICD-10-CM | POA: Diagnosis not present

## 2021-10-11 ENCOUNTER — Encounter (HOSPITAL_COMMUNITY): Payer: Self-pay | Admitting: Emergency Medicine

## 2021-10-11 ENCOUNTER — Emergency Department (HOSPITAL_COMMUNITY): Payer: Medicare HMO

## 2021-10-11 ENCOUNTER — Emergency Department (HOSPITAL_COMMUNITY)
Admission: EM | Admit: 2021-10-11 | Discharge: 2021-10-12 | Disposition: A | Discharge status: Home / Self Care | Payer: Medicare HMO | Attending: Emergency Medicine | Admitting: Emergency Medicine

## 2021-10-11 DIAGNOSIS — M791 Myalgia, unspecified site: Secondary | ICD-10-CM | POA: Insufficient documentation

## 2021-10-11 DIAGNOSIS — Z20822 Contact with and (suspected) exposure to covid-19: Secondary | ICD-10-CM | POA: Insufficient documentation

## 2021-10-11 DIAGNOSIS — R531 Weakness: Secondary | ICD-10-CM | POA: Diagnosis not present

## 2021-10-11 DIAGNOSIS — R0602 Shortness of breath: Secondary | ICD-10-CM | POA: Diagnosis not present

## 2021-10-11 DIAGNOSIS — R4182 Altered mental status, unspecified: Secondary | ICD-10-CM | POA: Insufficient documentation

## 2021-10-11 DIAGNOSIS — D582 Other hemoglobinopathies: Secondary | ICD-10-CM | POA: Diagnosis not present

## 2021-10-11 DIAGNOSIS — D649 Anemia, unspecified: Secondary | ICD-10-CM | POA: Diagnosis not present

## 2021-10-11 DIAGNOSIS — E876 Hypokalemia: Secondary | ICD-10-CM | POA: Insufficient documentation

## 2021-10-11 DIAGNOSIS — R410 Disorientation, unspecified: Secondary | ICD-10-CM | POA: Diagnosis not present

## 2021-10-11 DIAGNOSIS — E871 Hypo-osmolality and hyponatremia: Secondary | ICD-10-CM | POA: Diagnosis not present

## 2021-10-11 LAB — CBC
HCT: 34.4 % — ABNORMAL LOW (ref 36.0–46.0)
Hemoglobin: 11.2 g/dL — ABNORMAL LOW (ref 12.0–15.0)
MCH: 21.7 pg — ABNORMAL LOW (ref 26.0–34.0)
MCHC: 32.6 g/dL (ref 30.0–36.0)
MCV: 66.5 fL — ABNORMAL LOW (ref 80.0–100.0)
Platelets: 266 10*3/uL (ref 150–400)
RBC: 5.17 MIL/uL — ABNORMAL HIGH (ref 3.87–5.11)
RDW: 19.3 % — ABNORMAL HIGH (ref 11.5–15.5)
WBC: 5.3 10*3/uL (ref 4.0–10.5)
nRBC: 0 % (ref 0.0–0.2)

## 2021-10-11 LAB — I-STAT BETA HCG BLOOD, ED (MC, WL, AP ONLY): I-stat hCG, quantitative: <5 m[IU]/mL (ref <5)

## 2021-10-11 LAB — BASIC METABOLIC PANEL
Anion gap: 8 (ref 5–15)
BUN: 5 mg/dL — ABNORMAL LOW (ref 6–20)
CO2: 23 mmol/L (ref 22–32)
Calcium: 8.7 mg/dL — ABNORMAL LOW (ref 8.9–10.3)
Chloride: 103 mmol/L (ref 98–111)
Creatinine, Ser: 0.57 mg/dL (ref 0.44–1.00)
GFR, Estimated: >60 mL/min (ref >60)
Glucose, Bld: 125 mg/dL — ABNORMAL HIGH (ref 70–99)
Potassium: 3.4 mmol/L — ABNORMAL LOW (ref 3.5–5.1)
Sodium: 134 mmol/L — ABNORMAL LOW (ref 135–145)

## 2021-10-11 LAB — RESP PANEL BY RT-PCR (FLU A&B, COVID) ARPGX2
Influenza A by PCR: NEGATIVE
Influenza B by PCR: NEGATIVE
SARS Coronavirus 2 by RT PCR: NEGATIVE

## 2021-10-11 NOTE — ED Triage Notes (Signed)
Pt states she is cold, clammy, and has generalized body aches. She was recently surgically treated at [facility] for middle R finger infection. She states she was dx with pneumonia as well. Intermittent SOB and vomiting. General malaise.

## 2021-10-11 NOTE — ED Provider Triage Note (Signed)
Emergency Medicine Provider Triage Evaluation Note  Melissa Dougherty , a 51 y.o. female  was evaluated in triage.  Pt complains of generalized body aches for the last "couple days".  Patient reports he was recently discharged from [facility] regional for suspected finger infection, felon.  Patient reports that she was advised that she might have walking pneumonia and was placed on antibiotics.  The patient cannot tell me what antibiotic she has been placed on.  Patient endorses fevers.  Review of Systems  Positive:  Negative:   Physical Exam  BP 134/90 (BP Location: Left Arm)   Pulse (!) 102   Temp (!) 97.5 F (36.4 C) (Oral)   Resp 18   Ht '5\' 3"'$  (1.6 m)   Wt 49.9 kg   SpO2 100%   BMI 19.49 kg/m  Gen:   Awake, no distress   Resp:  Normal effort  MSK:   Moves extremities without difficulty  Other:    Medical Decision Making  Medically screening exam initiated at 8:05 PM.  Appropriate orders placed.  [name] was informed that the remainder of the evaluation will be completed by another provider, this initial triage assessment does not replace that evaluation, and the importance of remaining in the ED until their evaluation is complete.     Azucena Cecil, PA-C 10/11/21 2006

## 2021-10-12 ENCOUNTER — Emergency Department (HOSPITAL_COMMUNITY): Payer: Medicare HMO

## 2021-10-12 DIAGNOSIS — R410 Disorientation, unspecified: Secondary | ICD-10-CM | POA: Diagnosis not present

## 2021-10-12 LAB — URINALYSIS, ROUTINE W REFLEX MICROSCOPIC
Bilirubin Urine: NEGATIVE
Glucose, UA: NEGATIVE mg/dL
Hgb urine dipstick: NEGATIVE
Ketones, ur: NEGATIVE mg/dL
Leukocytes,Ua: NEGATIVE
Nitrite: NEGATIVE
Protein, ur: NEGATIVE mg/dL
Specific Gravity, Urine: 1.004 — ABNORMAL LOW (ref 1.005–1.030)
pH: 7 (ref 5.0–8.0)

## 2021-10-12 LAB — HEPATIC FUNCTION PANEL
ALT: 17 U/L (ref 0–44)
AST: 17 U/L (ref 15–41)
Albumin: 3.3 g/dL — ABNORMAL LOW (ref 3.5–5.0)
Alkaline Phosphatase: 63 U/L (ref 38–126)
Bilirubin, Direct: <0.1 mg/dL (ref 0.0–0.2)
Total Bilirubin: 0.7 mg/dL (ref 0.3–1.2)
Total Protein: 7.4 g/dL (ref 6.5–8.1)

## 2021-10-12 LAB — AMMONIA: Ammonia: 23 umol/L (ref 9–35)

## 2021-10-12 LAB — LIPASE, BLOOD: Lipase: 31 U/L (ref 11–51)

## 2021-10-12 LAB — RAPID URINE DRUG SCREEN, HOSP PERFORMED
Amphetamines: NOT DETECTED
Barbiturates: NOT DETECTED
Benzodiazepines: POSITIVE — AB
Cocaine: NOT DETECTED
Opiates: NOT DETECTED
Tetrahydrocannabinol: NOT DETECTED

## 2021-10-12 LAB — CBG MONITORING, ED: Glucose-Capillary: 85 mg/dL (ref 70–99)

## 2021-10-12 LAB — TROPONIN I (HIGH SENSITIVITY)
Troponin I (High Sensitivity): 2 ng/L (ref <18)
Troponin I (High Sensitivity): <2 ng/L (ref <18)

## 2021-10-12 LAB — ETHANOL: Alcohol, Ethyl (B): <10 mg/dL (ref <10)

## 2021-10-12 LAB — LACTIC ACID, PLASMA: Lactic Acid, Venous: 0.9 mmol/L (ref 0.5–1.9)

## 2021-10-12 MED ORDER — LACTATED RINGERS IV BOLUS
1000.0000 mL | Freq: Once | INTRAVENOUS | Status: AC
  Administered 2021-10-12: 1000 mL via INTRAVENOUS

## 2021-10-12 NOTE — ED Notes (Signed)
153/102 not an accurate bp actually 146/88

## 2021-10-12 NOTE — ED Provider Notes (Signed)
[facility] DEPT Provider Note   CSN: [id number] Arrival date & time: 10/11/21  1913     History  Chief Complaint  Patient presents with   Generalized Body Aches    Melissa Dougherty is a 51 y.o. female who presents for altered mental status and weakness as well as body aches for approximately last week.  She is coming at the bedside by her friend who provides most of the history.  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  The patient cannot tell me if she is currently on any antibiotics.  She was on IV vancomycin while inpatient.  She did have a fall during that admission and required sutures in the left.  On monthly Invega shots she did experience hyperammonemia during that admission with ammonia elevated to 175 on 5/8 quiring lactulose.  Patient is altered in the emergency room today, somnolent, falling asleep in between questions, difficult to arouse at times.  Able to identify the year and the hospital well but not answering many questions about her current history.  I have personally reviewed her medical records.  She is history of bipolar 1 disorder, depression, following at [facility].  Also history of alcohol abuse, seizures in the past.  Unclear if she is taking prescribed HCTZ. she is not anticoagulated.   Per friend, significant weight loss in the past few months, poor eating habits, sleeping only a few hours per night.   Level 5 caveat due to altered mental status in the emergency department today.  Newly prescribed Cogentin a recent admission, lorazepam as well.  On monthly Invega injections HPI     Home Medications Prior to Admission medications   Medication Sig Start Date End Date Taking? Authorizing Provider  FLUoxetine (PROZAC) 20 MG capsule Take 1  capsule (20 mg total) by mouth daily. Patient not taking: Reported on 07/27/2019 02/04/19   Johnn Hai, MD  hydrochlorothiazide (MICROZIDE) 12.5 MG capsule Take 12.5 mg by mouth daily. 06/24/19   [provider]  INVEGA SUSTENNA 234 MG/1.5ML SUSY injection Inject 1.5 mLs as directed every 30 (thirty) days.  01/18/19   [provider]  LOTEMAX SM 0.38 % GEL Place 1 drop into the right eye 3 (three) times daily. 03/17/19   [provider]  naproxen (NAPROSYN) 500 MG tablet Take 1 tablet (500 mg total) by mouth 2 (two) times daily. Patient not taking: Reported on 07/27/2019 05/31/19   Raylene Everts, MD  perphenazine (TRILAFON) 2 MG tablet Take 1 tablet (2 mg total) by mouth at bedtime. Patient not taking: Reported on 07/27/2019 02/03/19   Johnn Hai, MD  PROLENSA 0.07 % SOLN Place 1 drop into the right eye daily. 03/17/19   [provider]  temazepam (RESTORIL) 15 MG capsule Take 1 capsule (15 mg total) by mouth at bedtime as needed for sleep. Patient not taking: Reported on 07/27/2019 02/03/19   Johnn Hai, MD  traZODone (DESYREL) 50 MG tablet Take 100 mg by mouth at bedtime.     [provider]  benztropine (COGENTIN) 0.5 MG tablet Take 0.5 mg by mouth at bedtime. 01/17/19 05/31/19  [provider]  divalproex (DEPAKOTE SPRINKLE) 125 MG capsule Take 1 capsule (125 mg total) by mouth every 12 (twelve) hours. 02/03/19 05/31/19  Johnn Hai, MD  omeprazole (PRILOSEC) 20 MG capsule Take  20 mg by mouth 2 (two) times daily before a meal.  03/03/19 05/31/19  [provider]  rizatriptan (MAXALT) 10 MG tablet Take 1 tablet by mouth at the onset of headache. May repeat in 2 hours if needed Patient not taking: Reported on 01/31/2019 01/02/16 05/31/19  Golden Circle, FNP      Allergies    Patient has no known allergies.    Review of Systems   Review of Systems  Constitutional:  Positive for appetite change and fatigue.  Musculoskeletal:  Positive for  myalgias.  Neurological:  Positive for weakness.  Psychiatric/Behavioral:  Positive for confusion and sleep disturbance.    Physical Exam Updated Vital Signs BP 136/90   Pulse (!) 104   Temp (!) 97.5 F (36.4 C) (Oral)   Resp (!) 29   Ht '5\' 3"'$  (1.6 m)   Wt 49.9 kg   SpO2 100%   BMI 19.49 kg/m  Physical Exam Vitals and nursing note reviewed.  Constitutional:      Appearance: She is ill-appearing. She is not toxic-appearing.     Comments: Somnolent but easily aroused  HENT:     Head: Normocephalic and atraumatic.     Nose: Nose normal.     Mouth/Throat:     Mouth: Mucous membranes are moist.     Pharynx: Oropharynx is clear. Uvula midline. No oropharyngeal exudate or posterior oropharyngeal erythema.  Eyes:     General: Lids are normal. Vision grossly intact.        Right eye: No discharge.        Left eye: No discharge.     Extraocular Movements: Extraocular movements intact.     Conjunctiva/sclera: Conjunctivae normal.     Pupils: Pupils are equal, round, and reactive to light.  Neck:     Trachea: Trachea and phonation normal.  Cardiovascular:     Rate and Rhythm: Normal rate and regular rhythm.     Pulses: Normal pulses.     Heart sounds: Normal heart sounds. No murmur heard. Pulmonary:     Effort: Pulmonary effort is normal. No tachypnea, bradypnea, accessory muscle usage, prolonged expiration or respiratory distress.     Breath sounds: Normal breath sounds. No wheezing or rales.  Chest:     Chest wall: No mass, lacerations, deformity, swelling, tenderness, crepitus or edema.  Abdominal:     General: Bowel sounds are normal. There is no distension.     Palpations: Abdomen is soft.     Tenderness: There is no abdominal tenderness. There is no right CVA tenderness, left CVA tenderness, guarding or rebound.  Musculoskeletal:        General: No deformity.     Cervical back: Normal range of motion and neck supple. No tenderness.     Right lower leg: No edema.      Left lower leg: No edema.  Lymphadenopathy:     Cervical: No cervical adenopathy.  Skin:    General: Skin is warm and dry.     Capillary Refill: Capillary refill takes less than 2 seconds.     Findings: No rash.  Neurological:     General: No focal deficit present.     Mental Status: She is easily aroused. She is disoriented.     GCS: GCS eye subscore is 3. GCS verbal subscore is 4. GCS motor subscore is 6.  Psychiatric:        Mood and Affect: Mood normal.    ED Results / Procedures / Treatments  Labs (all labs ordered are listed, but only abnormal results are displayed) Labs Reviewed  CBC - Abnormal; Notable for the following components:      Result Value   RBC 5.17 (*)    Hemoglobin 11.2 (*)    HCT 34.4 (*)    MCV 66.5 (*)    MCH 21.7 (*)    RDW 19.3 (*)    All other components within normal limits  BASIC METABOLIC PANEL - Abnormal; Notable for the following components:   Sodium 134 (*)    Potassium 3.4 (*)    Glucose, Bld 125 (*)    BUN 5 (*)    Calcium 8.7 (*)    All other components within normal limits  URINALYSIS, ROUTINE W REFLEX MICROSCOPIC - Abnormal; Notable for the following components:   Color, Urine STRAW (*)    Specific Gravity, Urine 1.004 (*)    All other components within normal limits  RAPID URINE DRUG SCREEN, HOSP PERFORMED - Abnormal; Notable for the following components:   Benzodiazepines POSITIVE (*)    All other components within normal limits  HEPATIC FUNCTION PANEL - Abnormal; Notable for the following components:   Albumin 3.3 (*)    All other components within normal limits  RESP PANEL BY RT-PCR (FLU A&B, COVID) ARPGX2  AMMONIA  LACTIC ACID, PLASMA  ETHANOL  LIPASE, BLOOD  I-STAT BETA HCG BLOOD, ED ([facility], [facility], [facility] ONLY)  CBG MONITORING, ED  TROPONIN I (HIGH SENSITIVITY)  TROPONIN I (HIGH SENSITIVITY)    EKG None  Radiology DG Chest 2 View  Result Date: 10/11/2021 CLINICAL DATA:  Shortness of breath, possible pneumonia EXAM: CHEST  - 2 VIEW COMPARISON:  2014 FINDINGS: No consolidation or edema. No pleural effusion or pneumothorax. Normal heart size. Chronic upper right rib fractures deformity thorax. Surgical clips are present at the gastroesophageal junction. IMPRESSION: No acute process in the chest. Electronically Signed   By: Macy Mis M.D.   On: 10/11/2021 21:20   CT HEAD WO CONTRAST (5MM)  Result Date: 10/12/2021 CLINICAL DATA:  Delirium. EXAM: CT HEAD WITHOUT CONTRAST TECHNIQUE: Contiguous axial images were obtained from the base of the skull through the vertex without intravenous contrast. RADIATION DOSE REDUCTION: This exam was performed according to the departmental dose-optimization program which includes automated exposure control, adjustment of the mA and/or kV according to patient size and/or use of iterative reconstruction technique. COMPARISON:  Head CT dated 03/19/2019. FINDINGS: Brain: The ventricles and sulci are appropriate size for patient's age. Mild periventricular and deep white matter chronic microvascular ischemic changes noted. There is no acute intracranial hemorrhage. No mass effect or midline shift. No extra-axial fluid collection. Vascular: No hyperdense vessel or unexpected calcification. Skull: Normal. Negative for fracture or focal lesion. Sinuses/Orbits: No acute finding. Other: None IMPRESSION: 1. No acute intracranial pathology. 2. Mild chronic microvascular ischemic changes. Electronically Signed   By: Anner Crete M.D.   On: 10/12/2021 02:24    Procedures Procedures    Medications Ordered in ED Medications  lactated ringers bolus 1,000 mL (1,000 mLs Intravenous New Bag/Given 10/12/21 0132)    ED Course/ Medical Decision Making/ A&P Clinical Course as of 10/12/21 0559  Sat Oct 12, 2021  0527 Attempted to contact friend, Pamala Hurry, per patient request unsuccessfully.  [RS]    Clinical Course User Index [RS] Yvetta Drotar, Gypsy Balsam, PA-C                           Medical  Decision  Making 51 year old female presents with concern for weakness and altered mental status.  Vital signs are reassuring on intake.  Cardiopulmonary and abdominal exams are benign.  Patient is neurovascular intact all extremities.  Alert and oriented x2 but confused and her responses to this provider's questions.  Majority of history provided by her friend.  The differential diagnosis for AMS is extensive and includes, but is not limited to:  Drug overdose - opioids, alcohol, sedatives, antipsychotics, drug withdrawal, others Metabolic: hypoxia, hypoglycemia, hyperglycemia, hypercalcemia, hypernatremia, hyponatremia, uremia, hepatic encephalopathy, hypothyroidism, hyperthyroidism, vitamin B12 or thiamine deficiency, carbon monoxide poisoning, Wilson's disease, Lactic acidosis, DKA/HHOS Infectious: meningitis, encephalitis, bacteremia/sepsis, urinary tract infection, pneumonia, neurosyphilis Structural: Space-occupying lesion, (brain tumor, subdural hematoma, hydrocephalus,) Vascular: stroke, subarachnoid hemorrhage, coronary ischemia, hypertensive encephalopathy, CNS vasculitis, thrombotic thrombocytopenic purpura, disseminated intravascular coagulation, hyperviscosity Psychiatric: Schizophrenia, depression; Other: Seizure, hypothermia, heat stroke, ICU psychosis, dementia -"sundowning."     Amount and/or Complexity of Data Reviewed Labs: ordered.    Details: CBC with mild anemia at patient's baseline.  Hemoglobin 11.2.  BMP with mild hyponatremia and hypokalemia to 3.4, RVP negative, lactic acid is normal, ethanol is normal, hepatic function panel is normal urine is without evidence of infection and UDS is positive for benzodiazepines. Radiology: ordered.    Details: CXR negative. CT head visualized by this provider, no acute intracranial abnormality. ECG/medicine tests:     Details: EKG with sinus rhythm without QTc prolongation or STEMI.  No dysrhythmia.    Reevaluated and now awake, walking  around the room, eating and drinking, ambulating to and from the restroom independently without difficulty.  ANO x4 at this time.  Suspect combination of poor hydration and new medications with Cogentin and Ativan from recent discharge that may have contributed to her somnolence earlier in the day.  At this time given reassuring work-up, normal vital signs, and reexamination, no further work-up is warranted in the ER at this time.  Clinical concern for underlying emergent etiology for patient symptoms to warrant further ED work-up or inpatient management is exceedingly low.  Recommend close outpatient follow-up with psychiatry and PCP team for [facility].  Encouraged her to increase her hydration and p.o. intake.  Will recommend she follow-up closely with her gastroenterologist because she reports that her poor p.o. intake is secondary to needing to "have my esophagus stretched again".  Tolerating p.o. at this time in the emergency department.  Teka  voiced understanding of her medical evaluation and treatment plan. Each of their questions answered to their expressed satisfaction.  Return precautions were given.  Patient is well-appearing, stable, and was discharged in good condition.  This chart was dictated using voice recognition software, Dragon. Despite the best efforts of this provider to proofread and correct errors, errors may still occur which can change documentation meaning.  Final Clinical Impression(s) / ED Diagnoses Final diagnoses:  Weakness    Rx / DC Orders ED Discharge Orders     None         Emeline Darling, PA-C 10/12/21 0559    Orpah Greek, MD 10/12/21 [phone]

## 2021-10-12 NOTE — Discharge Instructions (Addendum)
Melissa Dougherty was seen in the ER today for her weakness and her confusion.  Her physical exam, vital signs, blood work, and imaging was all very reassuring.  Suspect she may have been dehydrated as she significantly improved after IV fluid resuscitation.  Please encourage her to eat and drink.  Have her follow-up with the gastroenterologist listed below for further management of her concerns regarding history of esophageal narrowing.  Return to the ER with any new severe symptoms.

## 2021-10-12 NOTE — ED Notes (Signed)
Patient ambulated to and from bathroom without difficulty.

## 2021-10-15 DIAGNOSIS — F3132 Bipolar disorder, current episode depressed, moderate: Secondary | ICD-10-CM | POA: Diagnosis not present

## 2021-11-21 ENCOUNTER — Other Ambulatory Visit: Payer: Self-pay | Admitting: Cardiology

## 2021-11-21 DIAGNOSIS — Z1231 Encounter for screening mammogram for malignant neoplasm of breast: Secondary | ICD-10-CM

## 2021-11-27 ENCOUNTER — Ambulatory Visit: Payer: Medicare HMO

## 2021-11-29 ENCOUNTER — Ambulatory Visit: Payer: Medicare HMO

## 2021-12-03 ENCOUNTER — Ambulatory Visit
Admission: RE | Admit: 2021-12-03 | Discharge: 2021-12-03 | Disposition: A | Discharge status: Home / Self Care | Payer: Medicare Other | Source: Ambulatory Visit | Attending: Cardiology | Admitting: Cardiology

## 2021-12-03 DIAGNOSIS — Z1231 Encounter for screening mammogram for malignant neoplasm of breast: Secondary | ICD-10-CM

## 2022-01-10 ENCOUNTER — Ambulatory Visit (HOSPITAL_COMMUNITY)
Admission: EM | Admit: 2022-01-10 | Discharge: 2022-01-10 | Disposition: A | Discharge status: Home / Self Care | Payer: Medicare Other | Attending: Internal Medicine | Admitting: Internal Medicine

## 2022-01-10 ENCOUNTER — Ambulatory Visit (INDEPENDENT_AMBULATORY_CARE_PROVIDER_SITE_OTHER): Payer: Medicare Other

## 2022-01-10 ENCOUNTER — Encounter (HOSPITAL_COMMUNITY): Payer: Self-pay

## 2022-01-10 DIAGNOSIS — K5900 Constipation, unspecified: Secondary | ICD-10-CM | POA: Diagnosis not present

## 2022-01-10 DIAGNOSIS — R3915 Urgency of urination: Secondary | ICD-10-CM | POA: Diagnosis not present

## 2022-01-10 DIAGNOSIS — R109 Unspecified abdominal pain: Secondary | ICD-10-CM

## 2022-01-10 DIAGNOSIS — R1084 Generalized abdominal pain: Secondary | ICD-10-CM | POA: Diagnosis not present

## 2022-01-10 LAB — POCT URINALYSIS DIPSTICK, ED / UC
Bilirubin Urine: NEGATIVE
Glucose, UA: NEGATIVE mg/dL
Hgb urine dipstick: NEGATIVE
Ketones, ur: NEGATIVE mg/dL
Leukocytes,Ua: NEGATIVE
Nitrite: NEGATIVE
Protein, ur: NEGATIVE mg/dL
Specific Gravity, Urine: <=1.005 (ref 1.005–1.030)
Urobilinogen, UA: 0.2 mg/dL (ref 0.0–1.0)
pH: 5.5 (ref 5.0–8.0)

## 2022-01-10 LAB — POC URINE PREG, ED: Preg Test, Ur: NEGATIVE

## 2022-01-10 MED ORDER — POLYETHYLENE GLYCOL 3350 17 GM/SCOOP PO POWD: 1.0000 | Freq: Once | ORAL | 0 refills | Status: AC

## 2022-01-10 MED ORDER — ACETAMINOPHEN 325 MG PO TABS
ORAL_TABLET | ORAL | Status: AC
  Filled 2022-01-10: qty 3

## 2022-01-10 MED ORDER — ACETAMINOPHEN 325 MG PO TABS
975.0000 mg | ORAL_TABLET | Freq: Once | ORAL | Status: AC
  Administered 2022-01-10: 975 mg via ORAL

## 2022-01-10 MED ORDER — DOCUSATE SODIUM 100 MG PO CAPS: 100.0000 mg | ORAL_CAPSULE | Freq: Every day | ORAL | 0 refills | Status: DC

## 2022-01-10 NOTE — Discharge Instructions (Addendum)
Your abdominal pain is likely due to constipation. Start taking MiraLAX twice daily until you are able to have a soft normal bowel movement.  Once you are able to have a soft normal bowel movement, decrease the MiraLAX to once daily for the next 3 days then use as needed. Increase the amount of fiber you are eating by eating more fruits, vegetables, and whole grains.  Increase your water intake to at least 8 cups of water per day to maintain good hydration. Take Colace stool softener daily to prevent constipation in the future.  I would like for you to follow-up with a primary care provider regarding your urinary urgency.  Decrease your intake of soda and juices as these are considered urinary irritants that may be contributing to your urinary urgency.  Your urinalysis does not show any signs of infection today.  To find a primary care provider go to [email or web address] and follow the prompts to schedule a new patient appointment for primary care.  Someone from [facility] health will be reaching out to you either via MyChart or phone call to help you set up a primary care provider appointment as well.  It is very important to have a primary care provider to manage your overall health and prevent/manage chronic medical conditions.   If you have not had a bowel movement in the next 2 to 3 days, please return to urgent care.  If you develop any new or worsening symptoms that are severe, please go to the emergency room for further evaluation.  I hope you feel better!

## 2022-01-10 NOTE — ED Triage Notes (Signed)
Patient has not had a period for the last month. Patient also having urinary leakage.  Patient has had unprotected sex a few times with the most recent last week. Not on any form of birth control.  Patient states that other than this last month her period has been regular.   Patient has been having hot flashes. No breast tenderness or nausea.   Patient states her feet "feels like glue to the touch". Patient states her feet are peeling as well. The feet are itching as well.

## 2022-01-10 NOTE — ED Provider Notes (Signed)
[facility]    CSN: [id number] Arrival date & time: 01/10/22  1515      History   Chief Complaint Chief Complaint  Patient presents with   Amenorrhea   Abdominal Pain   Foot Problem    HPI Melissa Dougherty is a 51 y.o. female.   Patient presents to urgent care for evaluation of generalized abdominal cramping and lower back pain for approximately 1 week.  Abdominal cramping and low back pain is a 5 on a scale of 0-10 at this time.  Patient states that she has a burning sensation when she voids and has had difficulty making it to the bathroom on time to urinate.  Also reports urinary frequency although she attributes this to increased intake of soda, juice, and water.  She states that over the last 2 weeks, she has intermittently been urinating in her underpants due to urinary urgency.  Denies fever, chills, nausea, vomiting, diarrhea, dizziness, vaginal discharge, and vaginal odor.  Reports vaginal itching and vaginal irritation.  Denies known exposure to STI/recent new sexual partners.  Last date of sexual intercourse was approximately 2 weeks ago and unprotected.  Patient would like a pregnancy test today since she has not had her menstrual cycle in August.  Last menstrual cycle was in July.  Patient does not use any form of contraception.  Last bowel movement was approximately 2 days ago and patient reports that this bowel movement was "hard".  States she has been having a difficult time moving her bowels recently.  Denies blood/mucus to the stools and rectal pain.  She has not attempted use of any over-the-counter medications prior to arrival urgent care for her symptoms.   Abdominal Pain   Past Medical History:  Diagnosis Date   Alcohol abuse    Allergy    Bipolar disorder (HCC)    Depression    Drug abuse (HCC)    GERD (gastroesophageal reflux disease)    Heart murmur    Hypertension    Seizures (HCC)    Last seizure was in 1989    Patient Active Problem List    Diagnosis Date Noted   Urge incontinence 07/18/2019   Menorrhagia with irregular cycle 06/16/2019   Bipolar 1 disorder, depressed, severe (HCC) 01/31/2019   Sensorineural hearing loss (SNHL) of both ears 08/09/2018   Increased storage iron 02/18/2016   Neuropathy 02/18/2016   Otitis media 02/18/2016   Fatigue 02/07/2016   Constipation 02/07/2016   Headache 12/25/2015   Routine general medical examination at a health care facility 11/01/2015   Medicare welcome exam 11/01/2015   Abdominal bloating 10/16/2015   Bipolar 1 disorder, mixed (HCC) 01/10/2014   Bipolar I disorder, most recent episode (or current) mixed, moderate 12/24/2013   Adjustment disorder with mixed anxiety and depressed mood 12/23/2013   MDD (major depressive disorder) 12/23/2013   Depressive disorder 12/22/2013   DEPRESSION 02/17/2007   GERD 02/17/2007   SEIZURE DISORDER, HX OF 02/17/2007    Past Surgical History:  Procedure Laterality Date   BACK SURGERY     ESOPHAGOPLASTY     TUBAL LIGATION      OB History     Gravida  2   Para  2   Term  2   Preterm      AB      Living  2      SAB      IAB      Ectopic      Multiple  Live Births  2            Home Medications    Prior to Admission medications   Medication Sig Start Date End Date Taking? Authorizing Provider  divalproex (DEPAKOTE) 500 MG DR tablet 2 tablets   Yes [provider]  docusate sodium (COLACE) 100 MG capsule Take 1 capsule (100 mg total) by mouth daily. 01/10/22  Yes Carlisle Beers, FNP  traZODone (DESYREL) 50 MG tablet Take 100 mg by mouth at bedtime.    Yes [provider]  FLUoxetine (PROZAC) 20 MG capsule Take 1 capsule (20 mg total) by mouth daily. Patient not taking: Reported on 07/27/2019 02/04/19   Malvin Johns, MD  hydrochlorothiazide (MICROZIDE) 12.5 MG capsule Take 12.5 mg by mouth daily. 06/24/19   [provider]  INVEGA SUSTENNA 234 MG/1.5ML SUSY injection Inject 1.5  mLs as directed every 30 (thirty) days.  01/18/19   [provider]  LOTEMAX SM 0.38 % GEL Place 1 drop into the right eye 3 (three) times daily. 03/17/19   [provider]  naproxen (NAPROSYN) 500 MG tablet Take 1 tablet (500 mg total) by mouth 2 (two) times daily. Patient not taking: Reported on 07/27/2019 05/31/19   Eustace Moore, MD  perphenazine (TRILAFON) 2 MG tablet Take 1 tablet (2 mg total) by mouth at bedtime. 02/03/19   Malvin Johns, MD  PROLENSA 0.07 % SOLN Place 1 drop into the right eye daily. 03/17/19   [provider]  temazepam (RESTORIL) 15 MG capsule Take 1 capsule (15 mg total) by mouth at bedtime as needed for sleep. Patient not taking: Reported on 07/27/2019 02/03/19   Malvin Johns, MD  benztropine (COGENTIN) 0.5 MG tablet Take 0.5 mg by mouth at bedtime. 01/17/19 05/31/19  [provider]  omeprazole (PRILOSEC) 20 MG capsule Take 20 mg by mouth 2 (two) times daily before a meal.  03/03/19 05/31/19  [provider]  rizatriptan (MAXALT) 10 MG tablet Take 1 tablet by mouth at the onset of headache. 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   Review of Systems Review of Systems  Gastrointestinal:  Positive for abdominal pain.  Per HPI   Physical Exam Triage Vital Signs ED Triage Vitals  Enc Vitals Group     BP 01/10/22 1550 138/86      Pulse Rate 01/10/22 1550 94     Resp 01/10/22 1550 16     Temp 01/10/22 1550 98.4 F (36.9 C)     Temp Source 01/10/22 1550 Oral     SpO2 01/10/22 1550 97 %     Weight 01/10/22 1554 137 lb (62.1 kg)     Height 01/10/22 1554 5\' 1"  (1.549 m)     Head Circumference --  Peak Flow --      Pain Score 01/10/22 1552 5     Pain Loc --      Pain Edu? --      Excl. in GC? --    No data found.  Updated Vital Signs BP 138/86 (BP Location: Right Arm)   Pulse 94   Temp 98.4 F (36.9 C) (Oral)   Resp 16   Ht 5\' 1"  (1.549 m)   Wt 137 lb (62.1 kg)   LMP 12/16/2021 (Approximate)   SpO2 97%   BMI 25.89 kg/m   Visual Acuity Right Eye Distance:   Left Eye Distance:   Bilateral Distance:    Right Eye Near:   Left Eye Near:    Bilateral Near:     Physical Exam Vitals and nursing note reviewed.  Constitutional:      Appearance: Normal appearance. She is not ill-appearing or toxic-appearing.     Comments: Very pleasant patient sitting on exam in position of comfort table in no acute distress.   HENT:     Head: Normocephalic and atraumatic.     Right Ear: Hearing and external ear normal.     Left Ear: Hearing and external ear normal.     Nose: Nose normal.     Mouth/Throat:     Lips: Pink.     Mouth: Mucous membranes are moist.  Eyes:     General: Lids are normal. Vision grossly intact. Gaze aligned appropriately.     Extraocular Movements: Extraocular movements intact.     Conjunctiva/sclera: Conjunctivae normal.  Cardiovascular:     Rate and Rhythm: Normal rate and regular rhythm.     Heart sounds: Normal heart sounds, S1 normal and S2 normal.  Pulmonary:     Effort: Pulmonary effort is normal. No respiratory distress.     Breath sounds: Normal breath sounds and air entry.  Abdominal:     General: Abdomen is flat. Bowel sounds are normal. There is no distension.     Palpations: Abdomen is soft.     Tenderness: There is generalized abdominal tenderness. There is no right  CVA tenderness, left CVA tenderness or guarding. Negative signs include McBurney's sign.  Genitourinary:    Comments: Deferred. Musculoskeletal:     Cervical back: Neck supple.  Skin:    General: Skin is warm and dry.     Capillary Refill: Capillary refill takes less than 2 seconds.     Findings: No rash.  Neurological:     General: No focal deficit present.     Mental Status: She is alert and oriented to person, place, and time. Mental status is at baseline.     Cranial Nerves: No dysarthria or facial asymmetry.     Gait: Gait is intact.  Psychiatric:        Mood and Affect: Mood normal.        Speech: Speech normal.        Behavior: Behavior normal.        Thought Content: Thought content normal.        Judgment: Judgment normal.      UC Treatments / Results  Labs (all labs ordered are listed, but only abnormal results are displayed) Labs Reviewed  POCT URINALYSIS DIPSTICK, ED / UC  POC URINE PREG, ED  CERVICOVAGINAL ANCILLARY ONLY    EKG   Radiology DG Abd 2 Views  Result Date: 01/10/2022 CLINICAL DATA:  Abdominal pain for 4 weeks, last bowel movement 2 days ago EXAM: ABDOMEN -  2 VIEW COMPARISON:  None Available. FINDINGS: Nonobstructive pattern of bowel gas. Large burden of stool throughout the colon and rectum. No free air in the abdomen. No radio-opaque calculi or other significant radiographic abnormality is seen. IMPRESSION: Nonobstructive pattern of bowel gas. Large burden of stool throughout the colon and rectum. Electronically Signed   By: Jearld Lesch M.D.   On: 01/10/2022 17:00    Procedures Procedures (including critical care time)  Medications Ordered in UC Medications  acetaminophen (TYLENOL) tablet 975 mg (975 mg Oral Given 01/10/22 1719)    Initial Impression / Assessment and Plan / UC Course  I have reviewed the triage vital signs and the nursing notes.  Pertinent labs & imaging results that were available during my care of the patient were  reviewed by me and considered in my medical decision making (see chart for details).   1.  Constipation and generalized abdominal pain Abdominal x-ray shows nonobstructive pattern of bowel gas and large stool burden throughout the colon and rectum.  This is likely the cause of patient's abdominal discomfort.  Patient to begin taking MiraLAX twice daily until able to have a soft bowel movement.  Then, MiraLAX to be used once daily for 3 days, then as needed.  Advised to increase water intake, fiber intake, and exercise.  Colace stool softener once daily prescribed to prevent future constipation.  Patient to return to urgent care if she is unable to have a bowel movement in 24 to 48 hours.   2.  Urinary frequency Urine pregnancy test is negative.  Urinalysis is negative for urinary tract infection.  Patient to follow-up with PCP regarding urinary symptoms for further evaluation and testing.  Advised patient to avoid urinary irritants in the meantime that can worsen urinary symptoms.  Discussed physical exam and available lab work findings in clinic with patient.  Counseled patient regarding appropriate use of medications and potential side effects for all medications recommended or prescribed today. Discussed red flag signs and symptoms of worsening condition,when to call the PCP office, return to urgent care, and when to seek higher level of care in the emergency department. Patient verbalizes understanding and agreement with plan. All questions answered. Patient discharged in stable condition.  Final Clinical Impressions(s) / UC Diagnoses   Final diagnoses:  Constipation, unspecified constipation type  Generalized abdominal pain  Urinary urgency     Discharge Instructions      Your abdominal pain is likely due to constipation. Start taking MiraLAX twice daily until you are able to have a soft normal bowel movement.  Once you are able to have a soft normal bowel movement, decrease the MiraLAX  to once daily for the next 3 days then use as needed. Increase the amount of fiber you are eating by eating more fruits, vegetables, and whole grains.  Increase your water intake to at least 8 cups of water per day to maintain good hydration. Take Colace stool softener daily to prevent constipation in the future.  I would like for you to follow-up with a primary care provider regarding your urinary urgency.  Decrease your intake of soda and juices as these are considered urinary irritants that may be contributing to your urinary urgency.  Your urinalysis does not show any signs of infection today.  To find a primary care provider go to [email or web address] and follow the prompts to schedule a new patient appointment for primary care.  Someone from [facility] health will be reaching out to you either via MyChart or  phone call to help you set up a primary care provider appointment as well.  It is very important to have a primary care provider to manage your overall health and prevent/manage chronic medical conditions.   If you have not had a bowel movement in the next 2 to 3 days, please return to urgent care.  If you develop any new or worsening symptoms that are severe, please go to the emergency room for further evaluation.  I hope you feel better!     ED Prescriptions     Medication Sig Dispense Auth. Provider   polyethylene glycol powder (GLYCOLAX/MIRALAX) 17 GM/SCOOP powder Take 255 g by mouth once for 1 dose. 255 g Reita May M, FNP   docusate sodium (COLACE) 100 MG capsule Take 1 capsule (100 mg total) by mouth daily. 60 capsule Carlisle Beers, FNP      PDMP not reviewed this encounter.   Carlisle Beers, [place] 01/11/22 2149

## 2022-01-13 LAB — CERVICOVAGINAL ANCILLARY ONLY
Bacterial Vaginitis (gardnerella): NEGATIVE
Candida Glabrata: NEGATIVE
Candida Vaginitis: POSITIVE — AB
Chlamydia: NEGATIVE
Comment: NEGATIVE
Comment: NEGATIVE
Comment: NEGATIVE
Comment: NEGATIVE
Comment: NEGATIVE
Comment: NORMAL
Neisseria Gonorrhea: NEGATIVE
Trichomonas: NEGATIVE

## 2022-01-14 ENCOUNTER — Telehealth (HOSPITAL_COMMUNITY): Payer: Self-pay | Admitting: Emergency Medicine

## 2022-01-14 MED ORDER — FLUCONAZOLE 150 MG PO TABS: 150.0000 mg | ORAL_TABLET | Freq: Once | ORAL | 0 refills | Status: AC

## 2022-04-12 ENCOUNTER — Ambulatory Visit: Payer: Medicare Other

## 2022-04-12 DIAGNOSIS — Z23 Encounter for immunization: Secondary | ICD-10-CM

## 2022-10-14 ENCOUNTER — Other Ambulatory Visit: Payer: Self-pay

## 2022-10-14 ENCOUNTER — Ambulatory Visit (HOSPITAL_COMMUNITY)
Admission: EM | Admit: 2022-10-14 | Discharge: 2022-10-15 | Disposition: A | Discharge status: Home / Self Care | Payer: Medicare HMO | Attending: Behavioral Health | Admitting: Behavioral Health

## 2022-10-14 ENCOUNTER — Encounter (HOSPITAL_COMMUNITY): Payer: Self-pay | Admitting: Behavioral Health

## 2022-10-14 DIAGNOSIS — I10 Essential (primary) hypertension: Secondary | ICD-10-CM | POA: Insufficient documentation

## 2022-10-14 DIAGNOSIS — F419 Anxiety disorder, unspecified: Secondary | ICD-10-CM | POA: Diagnosis present

## 2022-10-14 DIAGNOSIS — F319 Bipolar disorder, unspecified: Secondary | ICD-10-CM | POA: Diagnosis not present

## 2022-10-14 DIAGNOSIS — K219 Gastro-esophageal reflux disease without esophagitis: Secondary | ICD-10-CM | POA: Insufficient documentation

## 2022-10-14 DIAGNOSIS — Z79899 Other long term (current) drug therapy: Secondary | ICD-10-CM | POA: Insufficient documentation

## 2022-10-14 DIAGNOSIS — F69 Unspecified disorder of adult personality and behavior: Secondary | ICD-10-CM | POA: Diagnosis present

## 2022-10-14 DIAGNOSIS — Z9151 Personal history of suicidal behavior: Secondary | ICD-10-CM | POA: Insufficient documentation

## 2022-10-14 LAB — URINALYSIS, ROUTINE W REFLEX MICROSCOPIC
Bilirubin Urine: NEGATIVE
Glucose, UA: NEGATIVE mg/dL
Hgb urine dipstick: NEGATIVE
Ketones, ur: 20 mg/dL — AB
Leukocytes,Ua: NEGATIVE
Nitrite: NEGATIVE
Protein, ur: 30 mg/dL — AB
Specific Gravity, Urine: 1.027 (ref 1.005–1.030)
pH: 5 (ref 5.0–8.0)

## 2022-10-14 LAB — POCT URINE DRUG SCREEN - MANUAL ENTRY (I-SCREEN)
POC Amphetamine UR: NOT DETECTED
POC Buprenorphine (BUP): NOT DETECTED
POC Cocaine UR: NOT DETECTED
POC Marijuana UR: NOT DETECTED
POC Methadone UR: NOT DETECTED
POC Methamphetamine UR: NOT DETECTED
POC Morphine: NOT DETECTED
POC Oxazepam (BZO): NOT DETECTED
POC Oxycodone UR: NOT DETECTED
POC Secobarbital (BAR): NOT DETECTED

## 2022-10-14 LAB — POC URINE PREG, ED: Preg Test, Ur: NEGATIVE

## 2022-10-14 MED ORDER — PRAZOSIN HCL 2 MG PO CAPS
4.0000 mg | ORAL_CAPSULE | Freq: Once | ORAL | Status: AC
  Administered 2022-10-14: 4 mg via ORAL
  Filled 2022-10-14: qty 2

## 2022-10-14 MED ORDER — HYDROXYZINE HCL 25 MG PO TABS: 25.0000 mg | ORAL_TABLET | Freq: Three times a day (TID) | ORAL | Status: DC | PRN

## 2022-10-14 MED ORDER — MAGNESIUM HYDROXIDE 400 MG/5ML PO SUSP: 30.0000 mL | Freq: Every day | ORAL | Status: DC | PRN

## 2022-10-14 MED ORDER — TRAZODONE HCL 50 MG PO TABS: 50.0000 mg | ORAL_TABLET | Freq: Every evening | ORAL | Status: DC | PRN

## 2022-10-14 MED ORDER — ACETAMINOPHEN 325 MG PO TABS: 650.0000 mg | ORAL_TABLET | Freq: Four times a day (QID) | ORAL | Status: DC | PRN

## 2022-10-14 MED ORDER — ALUM & MAG HYDROXIDE-SIMETH 200-200-20 MG/5ML PO SUSP: 30.0000 mL | ORAL | Status: DC | PRN

## 2022-10-14 MED ORDER — LORAZEPAM 1 MG PO TABS
1.0000 mg | ORAL_TABLET | Freq: Two times a day (BID) | ORAL | Status: DC | PRN
  Administered 2022-10-14: 1 mg via ORAL
  Filled 2022-10-14: qty 1

## 2022-10-14 NOTE — ED Notes (Signed)
Pt A&O x 4, anxious with depression and behavioral changes..  MOther reports pt experiencing more anxiety attacks.  Denies SI, HI or AVH.  Monitoring for safety.

## 2022-10-14 NOTE — ED Notes (Signed)
Pt screaming out in her sleep, disturbing the milieu.  NP Sindy Guadeloupe notified.

## 2022-10-14 NOTE — Progress Notes (Signed)
   10/14/22 1659  [facility] Triage Screening (Walk-ins at [facility] only)  How Did You Hear About [place]? Family/Friend  What Is the Reason for Your Visit/Call Today? Pt presents to [facility] voluntarily accompanied by her mother due to recent episodes of anxiety. Pts mother reports the pts behavior changed this week and the pt began to have what seems like anxiety attacks. Pt is breathing heavily, calling for her mother who is beside her, having moments where "she can't hear". Pt reports she receives outpatient services through [place], she receives an injection 1x a month. Pt denies SI/HI and AVH.  How Long Has This Been Causing You Problems? <Week  Have You Recently Had Any Thoughts About Hurting Yourself? No  Are You Planning to Commit Suicide/Harm Yourself At This time? No  Have you Recently Had Thoughts About Hurting Someone Karolee Ohs? No  Are You Planning To Harm Someone At This Time? No  Are you currently experiencing any auditory, visual or other hallucinations? No  Have You Used Any Alcohol or Drugs in the Past 24 Hours? No  Do you have any current medical co-morbidities that require immediate attention? No  Clinician description of patient physical appearance/behavior: anxious, tearful  What Do You Feel Would Help You the Most Today? Stress Management;Treatment for Depression or other mood problem  If access to [facility] was not available, would you have sought care in the Emergency Department? No  Determination of Need Routine (7 days)  Options For Referral Outpatient Therapy;Medication Management

## 2022-10-14 NOTE — ED Provider Notes (Signed)
[facility] Continuous Assessment Admission H&P  Date: 10/14/22 Patient Name: Melissa Dougherty MRN: [id number] Chief Complaint: "She's had more anxiety and her behavior changed this week"  Diagnoses:  Final diagnoses:  Anxiety  Behavior concern in adult    HPI: Melissa Dougherty is a 52 y.o. female patient with a past psychiatric history of bipolar 1 disorder, substance-induced mood disorder, adjustment disorder, depression, suicidal thoughts, and insomnia who presented voluntarily and accompanied by her mother Melissa Dougherty) for a walk-in assessment with complaints of increased anxiety and behavior changes. Patient requested for her mother to remain present in room and participate in assessment.  Patient assessed face-to-face by this provider and chart reviewed on 10/14/22. On evaluation, Melissa Dougherty is seated in assessment area in no acute distress. Patient is alert and oriented x4, cooperative and pleasant. Patient appears tired and falls asleep throughout assessment. Patient startles easily when awoken, breathes heavily and calls out for her mother beside her, but calms back down within a few seconds. Speech is slow, normal volume, with occasional stutter. Eye contact is fair. Mood is anxious with congruent affect. Thought process is coherent with logical thought content. Patient denies suicidal and homicidal ideations and easily contracts verbally for safety with this Clinical research associate. Patient's mother reports patient had a patient suicide attempt "a long time ago when she was younger." Patient denies a history of self-harm. Patient's mother reports multiple past psychiatric hospitalizations. Per chart review, patient was last admitted to [facility] in April 2023. Patient denies auditory and visual hallucinations. Patient denies symptoms of paranoia. Patient is able to converse coherently with goal-directed thoughts and no distractibility or preoccupation. Objectively, there is no evidence  of psychosis/mania, delusional thinking, or indication that patient is responding to internal or external stimuli.   Patient reports poor sleep, stating she hasn't slept in several days due to the roaches in her house. Patient reports decreased appetite. Patient states she currently resides with her 53 y/o boyfriend and a female roommate but plans to move into her own apartment next month. Patient denies access to weapons. Patient denies use of alcohol or illicit substances. Patient is currently unemployed and states she plans to start at [employer] in July to get her GED. Patient receives outpatient psychiatric services at [facility] in [place] and states she is currently prescribed an Invega injection and Trazodone. Patient states she received her Invega injection on 10/11/22. Patient states she also goes to [facility] in [place]. Patient's mother states "she's been ok recently but she's had more anxiety and her behavior changed this week." Patient's mother states she believes patient may be having "anxiety attacks." Patient's mother states patient recently "got into it" with her female roommate, "she started hanging up on me yesterday, asked for the pastor's number, and sent me bad texts this morning, she only does this when she isn't doing well mentally, that's how I know something is going on." Patient's mother states the text messages were of patient expressing that she was upset her mother "gave her up" when she was young due to not being able to take care of her. Patient's mother also states patient has moments "where she says she can't hear."   Patient offered support and encouragement. Discussed admission to the continuous observation overnight for safety monitoring with reevaluation in the morning (10/15/22). Patient and mother are in agreement with plan of care.  Total Time spent with patient: 45 minutes  Musculoskeletal  Strength & Muscle Tone: within normal limits Gait &  Station:  normal Patient leans: N/A  Psychiatric Specialty Exam  Presentation General Appearance:  Appropriate for Environment; Casual  Eye Contact: Fair  Speech: Slow (Occasional stutter)  Speech Volume: Normal  Handedness: Right   Mood and Affect  Mood: Anxious  Affect: Congruent   Thought Process  Thought Processes: Coherent; Goal Directed  Descriptions of Associations:Intact  Orientation:Full (Time, Place and Person)  Thought Content:Logical    Hallucinations:Hallucinations: None  Ideas of Reference:None  Suicidal Thoughts:Suicidal Thoughts: No  Homicidal Thoughts:Homicidal Thoughts: No   Sensorium  Memory: Immediate Fair; Recent Fair; Remote Fair  Judgment: Fair  Insight: Fair   Art therapist  Concentration: Fair  Attention Span: Fair  Recall: [employer] of Knowledge: Fair  Language: Fair   Psychomotor Activity  Psychomotor Activity: Psychomotor Activity: Normal   Assets  Assets: Communication Skills; Desire for Improvement; Financial Resources/Insurance; Housing; Physical Health; Resilience; Social Support   Sleep  Sleep: Sleep: Poor (Patient states she has not been able to sleep due to roaches in her house)   Nutritional Assessment (For OBS and [facility] admissions only) Has the patient had a weight loss or gain of 10 pounds or more in the last 3 months?: No Has the patient had a decrease in food intake/or appetite?: No Does the patient have dental problems?: No Does the patient have eating habits or behaviors that may be indicators of an eating disorder including binging or inducing vomiting?: No Has the patient recently lost weight without trying?: 0 Has the patient been eating poorly because of a decreased appetite?: 0 Malnutrition Screening Tool Score: 0    Physical Exam Vitals and nursing note reviewed.  Constitutional:      General: She is not in acute distress.    Appearance: Normal appearance. She is not  ill-appearing.  HENT:     Head: Normocephalic and atraumatic.     Nose: Nose normal.  Eyes:     General:        Right eye: No discharge.        Left eye: No discharge.     Conjunctiva/sclera: Conjunctivae normal.  Cardiovascular:     Rate and Rhythm: Tachycardia present.  Pulmonary:     Effort: Pulmonary effort is normal. No respiratory distress.  Musculoskeletal:        General: Normal range of motion.     Cervical back: Normal range of motion.  Skin:    General: Skin is warm and dry.  Neurological:     General: No focal deficit present.     Mental Status: She is alert and oriented to person, place, and time. Mental status is at baseline.  Psychiatric:        Attention and Perception: Attention and perception normal.        Mood and Affect: Mood is anxious.        Behavior: Behavior normal. Behavior is cooperative.        Thought Content: Thought content normal. Thought content is not paranoid or delusional. Thought content does not include homicidal or suicidal ideation. Thought content does not include homicidal or suicidal plan.        Cognition and Memory: Cognition and memory normal.        Judgment: Judgment normal.     Comments: Affect: Congruent Speech: Slow, occasional stutter    Review of Systems  Constitutional: Negative.   HENT: Negative.    Eyes: Negative.   Respiratory: Negative.    Cardiovascular: Negative.   Gastrointestinal: Negative.  Genitourinary: Negative.   Musculoskeletal: Negative.   Skin: Negative.   Neurological: Negative.   Endo/Heme/Allergies: Negative.   Psychiatric/Behavioral:  Negative for depression, hallucinations, memory loss, substance abuse and suicidal ideas. The patient is nervous/anxious and has insomnia.     Blood pressure (!) 140/84, pulse (!) 105, temperature 98 F (36.7 C), temperature source Oral, resp. rate 18, SpO2 99 %. There is no height or weight on file to calculate BMI.  Past Psychiatric History: Bipolar 1  disorder, substance-induced mood disorder, adjustment disorder, depression, suicidal thoughts, insomnia  Is the patient at risk to self? No  Has the patient been a risk to self in the past 6 months? No .    Has the patient been a risk to self within the distant past? No   Is the patient a risk to others? No   Has the patient been a risk to others in the past 6 months? No   Has the patient been a risk to others within the distant past? No   Past Medical History:  Past Medical History:  Diagnosis Date   Alcohol abuse    Allergy    Bipolar disorder (HCC)    Depression    Drug abuse (HCC)    GERD (gastroesophageal reflux disease)    Heart murmur    Hypertension    Seizures (HCC)    Last seizure was in 1989   Family History:  Family History  Problem Relation Age of Onset   Diabetes Mother    Stroke Mother    Alcohol abuse Father    Diabetes Maternal Grandmother    Hypertension Maternal Grandmother    Breast cancer Maternal Aunt    Social History:  Social History   Tobacco Use   Smoking status: Never   Smokeless tobacco: Never  Vaping Use   Vaping Use: Never used  Substance Use Topics   Alcohol use: Not Currently   Drug use: Not Currently    Comment: clean x 5 years   Last Labs:  No visits with results within 6 Month(s) from this visit.  Latest known visit with results is:  Admission on 01/10/2022, Discharged on 01/10/2022  Component Date Value Ref Range Status   Glucose, UA 01/10/2022 NEGATIVE  NEGATIVE mg/dL Final   Bilirubin Urine 01/10/2022 NEGATIVE  NEGATIVE Final   Ketones, ur 01/10/2022 NEGATIVE  NEGATIVE mg/dL Final   Specific Gravity, Urine 01/10/2022 <=1.005  1.005 - 1.030 Final   Hgb urine dipstick 01/10/2022 NEGATIVE  NEGATIVE Final   pH 01/10/2022 5.5  5.0 - 8.0 Final   Protein, ur 01/10/2022 NEGATIVE  NEGATIVE mg/dL Final   Urobilinogen, UA 01/10/2022 0.2  0.0 - 1.0 mg/dL Final   Nitrite 60/73/7106 NEGATIVE  NEGATIVE Final   Leukocytes,Ua 01/10/2022  NEGATIVE  NEGATIVE Final   Biochemical Testing Only. Please order routine urinalysis from main lab if confirmatory testing is needed.   Preg Test, Ur 01/10/2022 NEGATIVE  NEGATIVE Final   Comment:        THE SENSITIVITY OF THIS METHODOLOGY IS >24 mIU/mL    Neisseria Gonorrhea 01/10/2022 Negative   Final   Chlamydia 01/10/2022 Negative   Final   Trichomonas 01/10/2022 Negative   Final   Bacterial Vaginitis (gardnerella) 01/10/2022 Negative   Final   Candida Vaginitis 01/10/2022 Positive (A)   Final   Candida Glabrata 01/10/2022 Negative   Final   Comment 01/10/2022 Normal Reference Range Bacterial Vaginosis - Negative   Final   Comment 01/10/2022 Normal Reference Range Candida  Species - Negative   Final   Comment 01/10/2022 Normal Reference Range Candida Galbrata - Negative   Final   Comment 01/10/2022 Normal Reference Range Trichomonas - Negative   Final   Comment 01/10/2022 Normal Reference Ranger Chlamydia - Negative   Final   Comment 01/10/2022 Normal Reference Range Neisseria Gonorrhea - Negative   Final    Allergies: Patient has no known allergies.  Medications:  Facility Ordered Medications  Medication   acetaminophen (TYLENOL) tablet 650 mg   alum & mag hydroxide-simeth (MAALOX/MYLANTA) 200-200-20 MG/5ML suspension 30 mL   magnesium hydroxide (MILK OF MAGNESIA) suspension 30 mL   hydrOXYzine (ATARAX) tablet 25 mg   traZODone (DESYREL) tablet 50 mg   LORazepam (ATIVAN) tablet 1 mg   PTA Medications  Medication Sig   INVEGA SUSTENNA 234 MG/1.5ML SUSY injection Inject 1.5 mLs as directed every 30 (thirty) days.    temazepam (RESTORIL) 15 MG capsule Take 1 capsule (15 mg total) by mouth at bedtime as needed for sleep. (Patient not taking: Reported on 07/27/2019)   perphenazine (TRILAFON) 2 MG tablet Take 1 tablet (2 mg total) by mouth at bedtime.   FLUoxetine (PROZAC) 20 MG capsule Take 1 capsule (20 mg total) by mouth daily. (Patient not taking: Reported on 07/27/2019)    PROLENSA 0.07 % SOLN Place 1 drop into the right eye daily.   LOTEMAX SM 0.38 % GEL Place 1 drop into the right eye 3 (three) times daily.   traZODone (DESYREL) 50 MG tablet Take 100 mg by mouth at bedtime.    naproxen (NAPROSYN) 500 MG tablet Take 1 tablet (500 mg total) by mouth 2 (two) times daily. (Patient not taking: Reported on 07/27/2019)   hydrochlorothiazide (MICROZIDE) 12.5 MG capsule Take 12.5 mg by mouth daily.   divalproex (DEPAKOTE) 500 MG DR tablet 2 tablets   docusate sodium (COLACE) 100 MG capsule Take 1 capsule (100 mg total) by mouth daily.      Medical Decision Making  KELY MAGER was admitted to [facility] continuous assessment unit for safety monitoring, crisis management, and stabilization. Routine labs ordered, which include Lab Orders         CBC with Differential/Platelet         Comprehensive metabolic panel         Hemoglobin A1c         Magnesium         Ethanol         Lipid panel         TSH         Prolactin         Urinalysis, Routine w reflex microscopic -Urine, Clean Catch         POC urine preg, ED         POCT Urine Drug Screen - (I-Screen)    Medication Management: Medications started Meds ordered this encounter  Medications   acetaminophen (TYLENOL) tablet 650 mg   alum & mag hydroxide-simeth (MAALOX/MYLANTA) 200-200-20 MG/5ML suspension 30 mL   magnesium hydroxide (MILK OF MAGNESIA) suspension 30 mL   hydrOXYzine (ATARAX) tablet 25 mg   traZODone (DESYREL) tablet 50 mg   LORazepam (ATIVAN) tablet 1 mg    Will maintain observation checks every 15 minutes for safety.   Recommendations  Based on my evaluation the patient does not appear to have an emergency medical condition.  -Admit to [facility] continuous observation overnight for safety monitoring with reevaluation in the morning (10/15/22).  Marchelle Folks  Harlin Heys, NP 10/14/22  7:40 PM

## 2022-10-14 NOTE — ED Notes (Signed)
Pt stuck x 2 for bloodwork, unsuccessful.  Will re-attempt in am.

## 2022-10-15 ENCOUNTER — Ambulatory Visit (HOSPITAL_COMMUNITY)
Admission: EM | Admit: 2022-10-15 | Discharge: 2022-10-15 | Disposition: A | Discharge status: Home / Self Care | Payer: Medicare HMO | Attending: Emergency Medicine | Admitting: Emergency Medicine

## 2022-10-15 DIAGNOSIS — Z3202 Encounter for pregnancy test, result negative: Secondary | ICD-10-CM | POA: Diagnosis not present

## 2022-10-15 DIAGNOSIS — F419 Anxiety disorder, unspecified: Secondary | ICD-10-CM | POA: Diagnosis not present

## 2022-10-15 LAB — POCT URINE PREGNANCY: Preg Test, Ur: NEGATIVE

## 2022-10-15 MED ORDER — LORAZEPAM 1 MG PO TABS: 1.0000 mg | ORAL_TABLET | Freq: Two times a day (BID) | ORAL | 0 refills | Status: DC | PRN

## 2022-10-15 MED ORDER — TRAZODONE HCL 50 MG PO TABS: 50.0000 mg | ORAL_TABLET | Freq: Every evening | ORAL | Status: DC | PRN

## 2022-10-15 MED ORDER — ACETAMINOPHEN 325 MG PO TABS: 650.0000 mg | ORAL_TABLET | Freq: Four times a day (QID) | ORAL | 2 refills | Status: DC | PRN

## 2022-10-15 NOTE — Discharge Instructions (Signed)

## 2022-10-15 NOTE — ED Triage Notes (Addendum)
Pt states she has swollen feet body ache for several month. Pt is requesting a pregnancy test.

## 2022-10-15 NOTE — ED Provider Notes (Signed)
[facility]    CSN: [id number] Arrival date & time: 10/15/22  1506     History   Chief Complaint Chief Complaint  Patient presents with   Possible Pregnancy    HPI Melissa Dougherty is a 52 y.o. female.  Here requesting pregnancy test Reporting body aches and swollen feet for several months She is unsure of her last menstrual cycle, chart documentation reveals perimenopausal with LMP July 2023  No nausea, vomiting, abdominal pain, bleeding or spotting Denies chest pain, shortness of breath, trouble breathing, fever  Past Medical History:  Diagnosis Date   Alcohol abuse    Allergy    Bipolar disorder (HCC)    Depression    Drug abuse (HCC)    GERD (gastroesophageal reflux disease)    Heart murmur    Hypertension    Seizures (HCC)    Last seizure was in 1989    Patient Active Problem List   Diagnosis Date Noted   Anxiety 10/14/2022   Behavior concern in adult 10/14/2022   Urge incontinence 07/18/2019   Menorrhagia with irregular cycle 06/16/2019   Bipolar 1 disorder, depressed, severe (HCC) 01/31/2019   Sensorineural hearing loss (SNHL) of both ears 08/09/2018   Increased storage iron 02/18/2016   Neuropathy 02/18/2016   Otitis media 02/18/2016   Fatigue 02/07/2016   Constipation 02/07/2016   Headache 12/25/2015   Routine general medical examination at a health care facility 11/01/2015   Medicare welcome exam 11/01/2015   Abdominal bloating 10/16/2015   Bipolar 1 disorder, mixed (HCC) 01/10/2014   Bipolar I disorder, most recent episode (or current) mixed, moderate 12/24/2013   Adjustment disorder with mixed anxiety and depressed mood 12/23/2013   MDD (major depressive disorder) 12/23/2013   Depressive disorder 12/22/2013   DEPRESSION 02/17/2007   GERD 02/17/2007   SEIZURE DISORDER, HX OF 02/17/2007    Past Surgical History:  Procedure Laterality Date   BACK SURGERY     ESOPHAGOPLASTY     TUBAL LIGATION      OB History     Gravida  2    Para  2   Term  2   Preterm      AB      Living  2      SAB      IAB      Ectopic      Multiple      Live Births  2            Home Medications    Prior to Admission medications   Medication Sig Start Date End Date Taking? Authorizing Provider  acetaminophen (TYLENOL) 325 MG tablet Take 2 tablets (650 mg total) by mouth every 6 (six) hours as needed. 10/15/22  Yes Deniece Rankin, Ray Church  INVEGA SUSTENNA 156 MG/ML SUSY injection Inject 156 mg into the muscle every 30 (thirty) days. 10/26/21   [provider]  LORazepam (ATIVAN) 1 MG tablet Take 1 tablet (1 mg total) by mouth 2 (two) times daily as needed for anxiety. 10/15/22   Lauree Chandler, NP  traZODone (DESYREL) 50 MG tablet Take 1 tablet (50 mg total) by mouth at bedtime as needed for sleep. 10/15/22   Lauree Chandler, NP  benztropine (COGENTIN) 0.5 MG tablet Take 0.5 mg by mouth at bedtime. 01/17/19 05/31/19  [provider]  omeprazole (PRILOSEC) 20 MG capsule Take 20 mg by mouth 2 (two) times daily before a meal.  03/03/19 05/31/19  [provider]  rizatriptan (MAXALT)  10 MG tablet Take 1 tablet by mouth at the onset of headache. 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   Review of Systems Review of Systems As per HPI  Physical Exam Triage Vital Signs ED Triage Vitals  Enc Vitals  Group     BP 10/15/22 1546 118/76     Pulse Rate 10/15/22 1546 (!) 122     Resp 10/15/22 1546 18     Temp 10/15/22 1546 98.1 F (36.7 C)     Temp Source 10/15/22 1546 Oral     SpO2 10/15/22 1550 98 %     Weight --      Height --      Head Circumference --      Peak Flow --      Pain Score --      Pain Loc --      Pain Edu? --      Excl. in GC? --    No data found.  Updated Vital Signs BP 118/76   Pulse (!) 122   Temp 98.1 F (36.7 C) (Oral)   Resp 18   SpO2 98%    Physical Exam Vitals and nursing note reviewed.  Constitutional:      General: She is not in acute distress.    Appearance: Normal appearance.  HENT:     Mouth/Throat:     Pharynx: Oropharynx is clear.  Cardiovascular:     Rate and Rhythm: Normal rate and regular rhythm.     Pulses: Normal pulses.     Heart sounds: Normal heart sounds.  Pulmonary:     Effort: Pulmonary effort is normal.     Breath sounds: Normal breath sounds.  Abdominal:     Tenderness: There is no abdominal tenderness.  Musculoskeletal:        General: Normal range of motion.     Cervical back: Normal range of motion.     Right lower leg: No edema.     Left lower leg: No edema.     Comments: No swelling noted  Skin:    General: Skin is warm and dry.  Neurological:     Mental Status: She is alert and oriented to person, place, and time.      UC Treatments / Results  Labs (all labs ordered are listed, but only abnormal results are displayed) Labs Reviewed  POCT URINE PREGNANCY    EKG  Radiology No results found.  Procedures Procedures (including critical care time)  Medications Ordered in UC Medications - No data to display  Initial Impression / Assessment and Plan / UC Course  I have reviewed the triage vital signs and the nursing notes.  Pertinent labs & imaging results that were available during my care of the patient  were reviewed by me and considered in my medical decision making (see chart for  details).  UPT is negative in clinic Discussed with patient her body aches and feet swelling could be related to many other things, at this time not pregnancy. Advised to use barrier protection. Can try elevating legs to reduce swelling, compression stockings, tylenol for aches. Follow with primary care. Reports she will call to make appointment with PCP tomorrow Return precautions discussed   Final Clinical Impressions(s) / UC Diagnoses   Final diagnoses:  Negative pregnancy test     Discharge Instructions      Negative pregnancy test today  Please call your primary care provider to make an appointment for follow up     ED Prescriptions     Medication Sig Dispense Auth. Provider   acetaminophen (TYLENOL) 325 MG tablet Take 2 tablets (650 mg total) by mouth every 6 (six) hours as needed. 30 tablet Klair Leising, Lurena Joiner, PA-C      PDMP not reviewed this encounter.   Tyberius Ryner, Lurena Joiner, [place] 10/15/22 1709

## 2022-10-15 NOTE — ED Notes (Signed)
Patient  pacing  in no acute stress. RR even and unlabored .Environment secured .Will continue to monitor for safely. 

## 2022-10-15 NOTE — ED Notes (Signed)
Pt awake & resting at present.  No distress noted.  Monitoring for safety. 

## 2022-10-15 NOTE — ED Notes (Signed)
Patient A&O x 4, ambulatory. Patient discharged in no acute distress. Patient denied SI/HI, A/VH upon discharge. Patient verbalized understanding of all discharge instructions explained by staff, to include follow up appointments, RX's and safety plan. Pt belongings returned to patient from locker #5 intact. Patient escorted to lobby via staff for transport to destination. Safety maintained.  

## 2022-10-15 NOTE — ED Provider Notes (Signed)
FBC/OBS ASAP Discharge Summary  Date and Time: 10/15/2022 10:40 AM  Name: Melissa Dougherty  MRN:  [id number]   Discharge Diagnoses:  Final diagnoses:  Anxiety  Behavior concern in adult   Subjective:   On reassessment, pt's affect brightens on approach. Denies suicidal, homicidal or violent ideations. Denies auditory visual hallucinations or paranoia. Requests discharge so she can attend her day program at [facility]. Reports receiving medication management from [facility].  Spoke w/ pt's mother Stanton Kidney [phone], who states she can pick pt up today and bring her to day program. She states she will help pt get connected with counseling at [facility].   Stay Summary:  Pt is a 52 y/o female w/ history of bipolar 1 disorder, substance-induced mood disorder, adjustment disorder, depression, suicidal thoughts, insomnia, allergy, gerd, heart murmur, htn, seizures, presenting to [facility] on 10/14/22, per mother, due to anxiety and behavior changes. On reassessment, pt denies suicidal, homicidal or violent ideations. She denies auditory visual hallucinations or paranoia. Pt to be discharged and to follow up with [facility] for outpatient behavioral health services.  Total Time spent with patient: 45 minutes  Past Psychiatric History: Bipolar 1 disorder, substance-induced mood disorder, adjustment disorder, depression, suicidal thoughts, insomnia Past Medical History: Allergy, gerd, heart murmur, hypertension, seizures Family History: diabetes, stroke, alcohol abuse, hypertension, breast cancer Family Psychiatric History: Alcohol abuse Social History: Attends [facility] day program, living with roommates Tobacco Cessation:  A prescription for an FDA-approved tobacco cessation medication was offered at discharge and the patient refused  Current Medications:  Current Facility-Administered Medications  Medication Dose Route Frequency Provider Last Rate Last Admin   acetaminophen (TYLENOL) tablet  650 mg  650 mg Oral Q6H PRN Sunday Corn, NP       alum & mag hydroxide-simeth (MAALOX/MYLANTA) 200-200-20 MG/5ML suspension 30 mL  30 mL Oral Q4H PRN Sunday Corn, NP       hydrOXYzine (ATARAX) tablet 25 mg  25 mg Oral TID PRN Sunday Corn, NP       LORazepam (ATIVAN) tablet 1 mg  1 mg Oral BID PRN Sunday Corn, NP   1 mg at 10/14/22 1844   magnesium hydroxide (MILK OF MAGNESIA) suspension 30 mL  30 mL Oral Daily PRN Sunday Corn, NP       traZODone (DESYREL) tablet 50 mg  50 mg Oral QHS PRN Sunday Corn, NP       Current Outpatient Medications  Medication Sig Dispense Refill   INVEGA SUSTENNA 156 MG/ML SUSY injection Inject 156 mg into the muscle every 30 (thirty) days.     LORazepam (ATIVAN) 1 MG tablet Take 1 tablet (1 mg total) by mouth 2 (two) times daily as needed for anxiety. 30 tablet 0   traZODone (DESYREL) 50 MG tablet Take 1 tablet (50 mg total) by mouth at bedtime as needed for sleep.      PTA Medications:  Facility Ordered Medications  Medication   acetaminophen (TYLENOL) tablet 650 mg   alum & mag hydroxide-simeth (MAALOX/MYLANTA) 200-200-20 MG/5ML suspension 30 mL   magnesium hydroxide (MILK OF MAGNESIA) suspension 30 mL   hydrOXYzine (ATARAX) tablet 25 mg   traZODone (DESYREL) tablet 50 mg   LORazepam (ATIVAN) tablet 1 mg   [COMPLETED] prazosin (MINIPRESS) capsule 4 mg   PTA Medications  Medication Sig   INVEGA SUSTENNA 156 MG/ML SUSY injection Inject 156 mg into the muscle every 30 (thirty) days.   LORazepam (ATIVAN) 1 MG tablet Take 1 tablet (1  mg total) by mouth 2 (two) times daily as needed for anxiety.   traZODone (DESYREL) 50 MG tablet Take 1 tablet (50 mg total) by mouth at bedtime as needed for sleep.       08/11/2019    2:59 PM 07/18/2019   10:38 AM 06/16/2019   10:18 AM  Depression screen PHQ 2/9  Decreased Interest 0 0 2  Down, Depressed, Hopeless 0 0 3  PHQ - 2 Score 0 0 5  Altered sleeping 3 2 3   Tired, decreased  energy 3 3 2   Change in appetite 0 3 3  Feeling bad or failure about yourself  0 0 0  Trouble concentrating 0 0 0  Moving slowly or fidgety/restless 0 0 0  Suicidal thoughts 0 0 0  PHQ-9 Score 6 8 13     Flowsheet Row ED from 10/14/2022 in [facility] ED from 01/10/2022 in [facility] ED from 10/11/2021 in [facility] Emergency Department at [facility]  C-SSRS RISK CATEGORY No Risk No Risk No Risk       Musculoskeletal  Strength & Muscle Tone: within normal limits Gait & Station: normal Patient leans: N/A  Psychiatric Specialty Exam  Presentation  General Appearance:  Appropriate for Environment  Eye Contact: Fair  Speech: Slow  Speech Volume: Normal  Handedness: Right   Mood and Affect  Mood: Euthymic  Affect: Constricted; Blunt   Thought Process  Thought Processes: Coherent; Goal Directed  Descriptions of Associations:Intact  Orientation:Full (Time, Place and Person)  Thought Content:Logical     Hallucinations:Hallucinations: None  Ideas of Reference:None  Suicidal Thoughts:Suicidal Thoughts: No  Homicidal Thoughts:Homicidal Thoughts: No   Sensorium  Memory: Immediate Fair  Judgment: Fair  Insight: Fair   Art therapist  Concentration: Fair  Attention Span: Fair  Recall: [employer] of Knowledge: Fair  Language: Fair   Psychomotor Activity  Psychomotor Activity: Psychomotor Activity: Normal   Assets  Assets: Communication Skills; Desire for Improvement; Financial Resources/Insurance; Housing; Resilience; Social Support   Sleep  Sleep: Sleep: Poor   Nutritional Assessment (For OBS and FBC admissions only) Has the patient had a weight loss or gain of 10 pounds or more in the last 3 months?: No Has the patient had a decrease in food intake/or appetite?: No Does the patient have dental problems?: No Does the patient have eating habits or  behaviors that may be indicators of an eating disorder including binging or inducing vomiting?: No Has the patient recently lost weight without trying?: 0 Has the patient been eating poorly because of a decreased appetite?: 0 Malnutrition Screening Tool Score: 0    Physical Exam  Physical Exam Constitutional:      General: She is not in acute distress.    Appearance: She is not ill-appearing, toxic-appearing or diaphoretic.  Eyes:     General: No scleral icterus. Cardiovascular:     Rate and Rhythm: Tachycardia present.  Pulmonary:     Effort: Pulmonary effort is normal. No respiratory distress.  Skin:    General: Skin is warm and dry.  Neurological:     Mental Status: She is alert and oriented to person, place, and time.  Psychiatric:        Attention and Perception: Attention and perception normal.        Mood and Affect: Mood normal. Affect is blunt.        Behavior: Behavior is cooperative.        Thought Content:  Thought content normal.        Cognition and Memory: Cognition and memory normal.    Review of Systems  Constitutional:  Negative for chills and fever.  Respiratory:  Negative for shortness of breath.   Cardiovascular:  Negative for chest pain and palpitations.  Gastrointestinal:  Negative for abdominal pain.  Neurological:  Negative for headaches.   Blood pressure 116/79, pulse (!) 125, temperature 97.9 F (36.6 C), temperature source Oral, resp. rate 18, SpO2 100 %. There is no height or weight on file to calculate BMI.  Demographic Factors:  NA  Loss Factors: NA  Historical Factors: NA  Risk Reduction Factors:   Sense of responsibility to family, Living with another person, especially a relative, and Positive social support  Continued Clinical Symptoms:  Previous Psychiatric Diagnoses and Treatments  Cognitive Features That Contribute To Risk:  None    Suicide Risk:  Minimal: No identifiable suicidal ideation.  Patients presenting with no  risk factors but with morbid ruminations; may be classified as minimal risk based on the severity of the depressive symptoms  Plan Of Care/Follow-up recommendations:  Other:  follow up with [facility] for continued behavioral health services  Disposition:  Discharge Follow up with [facility] for continued behavioral health services  Lauree Chandler, NP 10/15/2022, 10:40 AM

## 2022-10-15 NOTE — ED Notes (Signed)
Patient alert and oriented x 3. Denies SI/HI/AVH. Denies intent or plan to harm self or others. Routine conducted according to faculty protocol. Encourage patient to notify staff with any needs or concerns. Patient verbalized agreement and understanding. Will continue to monitor for safety. 

## 2022-10-15 NOTE — Discharge Instructions (Addendum)
Negative pregnancy test today  Please call your primary care provider to make an appointment for follow up

## 2022-10-16 ENCOUNTER — Encounter: Payer: Self-pay | Admitting: Gastroenterology

## 2022-10-20 ENCOUNTER — Encounter (HOSPITAL_COMMUNITY): Payer: Self-pay

## 2022-10-20 ENCOUNTER — Other Ambulatory Visit: Payer: Self-pay

## 2022-10-20 ENCOUNTER — Emergency Department (HOSPITAL_COMMUNITY): Payer: Medicare HMO

## 2022-10-20 ENCOUNTER — Emergency Department (HOSPITAL_COMMUNITY)
Admission: EM | Admit: 2022-10-20 | Discharge: 2022-10-20 | Disposition: A | Discharge status: Home / Self Care | Payer: Medicare HMO | Attending: Emergency Medicine | Admitting: Emergency Medicine

## 2022-10-20 DIAGNOSIS — R112 Nausea with vomiting, unspecified: Secondary | ICD-10-CM | POA: Diagnosis present

## 2022-10-20 DIAGNOSIS — I10 Essential (primary) hypertension: Secondary | ICD-10-CM | POA: Diagnosis not present

## 2022-10-20 DIAGNOSIS — K5900 Constipation, unspecified: Secondary | ICD-10-CM | POA: Diagnosis not present

## 2022-10-20 LAB — CBC WITH DIFFERENTIAL/PLATELET
Abs Immature Granulocytes: 0.01 10*3/uL (ref 0.00–0.07)
Basophils Absolute: 0 10*3/uL (ref 0.0–0.1)
Basophils Relative: 0 %
Eosinophils Absolute: 0.1 10*3/uL (ref 0.0–0.5)
Eosinophils Relative: 2 %
HCT: 36.8 % (ref 36.0–46.0)
Hemoglobin: 11.5 g/dL — ABNORMAL LOW (ref 12.0–15.0)
Immature Granulocytes: 0 %
Lymphocytes Relative: 26 %
Lymphs Abs: 1.5 10*3/uL (ref 0.7–4.0)
MCH: 21.1 pg — ABNORMAL LOW (ref 26.0–34.0)
MCHC: 31.3 g/dL (ref 30.0–36.0)
MCV: 67.6 fL — ABNORMAL LOW (ref 80.0–100.0)
Monocytes Absolute: 0.5 10*3/uL (ref 0.1–1.0)
Monocytes Relative: 9 %
Neutro Abs: 3.7 10*3/uL (ref 1.7–7.7)
Neutrophils Relative %: 63 %
Platelets: 197 10*3/uL (ref 150–400)
RBC: 5.44 MIL/uL — ABNORMAL HIGH (ref 3.87–5.11)
RDW: 17.8 % — ABNORMAL HIGH (ref 11.5–15.5)
WBC: 5.8 10*3/uL (ref 4.0–10.5)
nRBC: 0 % (ref 0.0–0.2)

## 2022-10-20 LAB — COMPREHENSIVE METABOLIC PANEL
ALT: 13 U/L (ref 0–44)
AST: 29 U/L (ref 15–41)
Albumin: 3.4 g/dL — ABNORMAL LOW (ref 3.5–5.0)
Alkaline Phosphatase: 70 U/L (ref 38–126)
Anion gap: 10 (ref 5–15)
BUN: 8 mg/dL (ref 6–20)
CO2: 23 mmol/L (ref 22–32)
Calcium: 8.6 mg/dL — ABNORMAL LOW (ref 8.9–10.3)
Chloride: 102 mmol/L (ref 98–111)
Creatinine, Ser: 0.98 mg/dL (ref 0.44–1.00)
GFR, Estimated: >60 mL/min (ref >60)
Glucose, Bld: 98 mg/dL (ref 70–99)
Potassium: 5.3 mmol/L — ABNORMAL HIGH (ref 3.5–5.1)
Sodium: 135 mmol/L (ref 135–145)
Total Bilirubin: 1.2 mg/dL (ref 0.3–1.2)
Total Protein: 6.9 g/dL (ref 6.5–8.1)

## 2022-10-20 LAB — URINALYSIS, ROUTINE W REFLEX MICROSCOPIC
Bilirubin Urine: NEGATIVE
Glucose, UA: NEGATIVE mg/dL
Hgb urine dipstick: NEGATIVE
Ketones, ur: 20 mg/dL — AB
Leukocytes,Ua: NEGATIVE
Nitrite: NEGATIVE
Protein, ur: NEGATIVE mg/dL
Specific Gravity, Urine: 1.021 (ref 1.005–1.030)
pH: 5 (ref 5.0–8.0)

## 2022-10-20 LAB — PREGNANCY, URINE: Preg Test, Ur: NEGATIVE

## 2022-10-20 MED ORDER — IOHEXOL 350 MG/ML SOLN
75.0000 mL | Freq: Once | INTRAVENOUS | Status: AC | PRN
  Administered 2022-10-20: 75 mL via INTRAVENOUS

## 2022-10-20 MED ORDER — ONDANSETRON 4 MG PO TBDP: 4.0000 mg | ORAL_TABLET | Freq: Three times a day (TID) | ORAL | 0 refills | Status: DC | PRN

## 2022-10-20 MED ORDER — LACTATED RINGERS IV BOLUS
1000.0000 mL | Freq: Once | INTRAVENOUS | Status: AC
  Administered 2022-10-20: 1000 mL via INTRAVENOUS

## 2022-10-20 MED ORDER — ONDANSETRON HCL 4 MG/2ML IJ SOLN
4.0000 mg | Freq: Once | INTRAMUSCULAR | Status: AC
  Administered 2022-10-20: 4 mg via INTRAVENOUS
  Filled 2022-10-20: qty 2

## 2022-10-20 MED ORDER — PANTOPRAZOLE SODIUM 20 MG PO TBEC: 20.0000 mg | DELAYED_RELEASE_TABLET | Freq: Every day | ORAL | 1 refills | Status: DC

## 2022-10-20 NOTE — ED Notes (Signed)
Patient ambulated to restroom with steady gait.

## 2022-10-20 NOTE — ED Provider Notes (Signed)
[place] EMERGENCY DEPARTMENT AT [facility] Provider Note   CSN: [id number] Arrival date & time: 10/20/22  1528     History  Chief Complaint  Patient presents with   dry mouth.     Melissa Dougherty is a 52 y.o. female.  HPI 52 year old female history of bipolar disorders, schizophrenia, hypertension, GERD presenting for nausea, emesis.  Patient states for the last several weeks to months she has had recurrent episodes of nonbloody bilious emesis and dry mouth.  She states she got tired dealing with this today she came for evaluation.  She had 2 episodes of emesis today.  She has been somewhat constipated, she is passing gas.  Last had abdominal about 2 days ago.  No melena or hematochezia.  She has not had any abdominal pain, chest pain, difficulty breathing.  No fevers or chills.  Occasional dysuria, no vaginal bleeding or discharge.  She states she is currently living with another person who she does not like living with.  However she is trying to move out on June 1.  She does not wish to speak with the social worker or have any other help.  She takes Invega long-acting injections for her bipolar disorder, she last had this on the 18th and is up-to-date.      Home Medications Prior to Admission medications   Medication Sig Start Date End Date Taking? Authorizing Provider  ondansetron (ZOFRAN-ODT) 4 MG disintegrating tablet Take 1 tablet (4 mg total) by mouth every 8 (eight) hours as needed for nausea or vomiting. 10/20/22  Yes Fulton Reek, MD  pantoprazole (PROTONIX) 20 MG tablet Take 1 tablet (20 mg total) by mouth daily. 10/20/22  Yes Fulton Reek, MD  acetaminophen (TYLENOL) 325 MG tablet Take 2 tablets (650 mg total) by mouth every 6 (six) hours as needed. 10/15/22   Rising, Lurena Joiner, PA-C  INVEGA SUSTENNA 156 MG/ML SUSY injection Inject 156 mg into the muscle every 30 (thirty) days. 10/26/21   [provider]  LORazepam (ATIVAN) 1 MG tablet Take 1 tablet (1  mg total) by mouth 2 (two) times daily as needed for anxiety. 10/15/22   Lauree Chandler, NP  traZODone (DESYREL) 50 MG tablet Take 1 tablet (50 mg total) by mouth at bedtime as needed for sleep. 10/15/22   Lauree Chandler, NP  benztropine (COGENTIN) 0.5 MG tablet Take 0.5 mg by mouth at bedtime. 01/17/19 05/31/19  [provider]  omeprazole (PRILOSEC) 20 MG capsule Take 20 mg by mouth 2 (two) times daily before a meal.  03/03/19 05/31/19  [provider]  rizatriptan (MAXALT) 10 MG tablet Take 1 tablet by mouth at the onset of headache. May repeat in 2 hours if needed Patient not taking: Reported on 01/31/2019 01/02/16 05/31/19  Veryl Speak, FNP      Allergies    Patient has no known allergies.    Review of Systems   Review of Systems  Gastrointestinal:  Positive for constipation, nausea and vomiting.  All other systems reviewed and are negative.   Physical Exam Updated Vital Signs BP 139/76   Pulse 100   Temp 98 F (36.7 C)   Resp 18   Ht 5\' 1"  (1.549 m)   Wt 68 kg   SpO2 97%   BMI 28.34 kg/m  Physical Exam Vitals and nursing note reviewed.  Constitutional:      General: She is not in acute distress.    Appearance: She is well-developed.  HENT:  Head: Normocephalic and atraumatic.     Nose: Nose normal.     Mouth/Throat:     Mouth: Mucous membranes are dry.     Pharynx: Oropharynx is clear. No oropharyngeal exudate or posterior oropharyngeal erythema.  Eyes:     Extraocular Movements: Extraocular movements intact.     Conjunctiva/sclera: Conjunctivae normal.     Pupils: Pupils are equal, round, and reactive to light.  Cardiovascular:     Rate and Rhythm: Normal rate and regular rhythm.     Heart sounds: No murmur heard. Pulmonary:     Effort: Pulmonary effort is normal. No respiratory distress.     Breath sounds: Normal breath sounds.  Abdominal:     General: There is no distension.     Palpations: Abdomen is soft.     Tenderness: There is  no abdominal tenderness. There is no guarding or rebound.  Musculoskeletal:        General: No swelling.     Cervical back: Neck supple.  Skin:    General: Skin is warm and dry.     Capillary Refill: Capillary refill takes less than 2 seconds.  Neurological:     General: No focal deficit present.     Mental Status: She is alert and oriented to person, place, and time. Mental status is at baseline.     Cranial Nerves: No cranial nerve deficit.     Motor: No weakness.  Psychiatric:        Mood and Affect: Mood normal.     ED Results / Procedures / Treatments   Labs (all labs ordered are listed, but only abnormal results are displayed) Labs Reviewed  CBC WITH DIFFERENTIAL/PLATELET - Abnormal; Notable for the following components:      Result Value   RBC 5.44 (*)    Hemoglobin 11.5 (*)    MCV 67.6 (*)    MCH 21.1 (*)    RDW 17.8 (*)    All other components within normal limits  COMPREHENSIVE METABOLIC PANEL - Abnormal; Notable for the following components:   Potassium 5.3 (*)    Calcium 8.6 (*)    Albumin 3.4 (*)    All other components within normal limits  URINALYSIS, ROUTINE W REFLEX MICROSCOPIC - Abnormal; Notable for the following components:   APPearance HAZY (*)    Ketones, ur 20 (*)    All other components within normal limits  PREGNANCY, URINE    EKG EKG Interpretation  Date/Time:  Monday Oct 20 2022 16:00:10 EDT Ventricular Rate:  101 PR Interval:  133 QRS Duration: 72 QT Interval:  336 QTC Calculation: 436 R Axis:   78 Text Interpretation: Sinus tachycardia Confirmed by Vonita Moss [phone]) on 10/20/2022 5:37:50 PM  Radiology CT ABDOMEN PELVIS W CONTRAST  Result Date: 10/20/2022 CLINICAL DATA:  multiple abdominal surgeries, nausea and vomiting EXAM: CT ABDOMEN AND PELVIS WITH CONTRAST TECHNIQUE: Multidetector CT imaging of the abdomen and pelvis was performed using the standard protocol following bolus administration of intravenous contrast. RADIATION  DOSE REDUCTION: This exam was performed according to the departmental dose-optimization program which includes automated exposure control, adjustment of the mA and/or kV according to patient size and/or use of iterative reconstruction technique. CONTRAST:  75mL OMNIPAQUE IOHEXOL 350 MG/ML SOLN COMPARISON:  07/01/2015 FINDINGS: Lower chest: Again noted is a prominent vascular structure in the right lower lobe, partially imaged. 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 This is unchanged since prior study. There is wall thickening within the distal  esophagus. Probable small hiatal hernia also demonstrates wall thickening. Radiopaque density is seen within the lumen of the small hiatal hernia, presumably an ingested pill. Hepatobiliary: No focal hepatic abnormality. Gallbladder unremarkable. Pancreas: No focal abnormality or ductal dilatation. Spleen: No focal abnormality.  Normal size. Adrenals/Urinary Tract: No adrenal abnormality. No focal renal abnormality. No stones or hydronephrosis. Urinary bladder is unremarkable. Stomach/Bowel: Normal appendix. Moderate stool burden throughout the colon. Stomach, large and small bowel grossly unremarkable. Vascular/Lymphatic: No evidence of aneurysm or adenopathy. Reproductive: Uterus and adnexa unremarkable.  No mass. Other: No free fluid or free air. Musculoskeletal: No acute bony abnormality. IMPRESSION: Small hiatal hernia. Wall thickening in the distal esophagus and hernia suggesting esophagitis/gastritis, possibly related to reflux. Radiopaque density within the hiatal hernia, presumably ingested pill. Otherwise no acute findings in the abdomen or pelvis. Moderate stool burden. Electronically Signed   By: Charlett Nose M.D.   On: 10/20/2022 19:38    Procedures Procedures    Medications Ordered in ED Medications  lactated ringers bolus 1,000 mL (0 mLs Intravenous Stopped 10/20/22 2057)  ondansetron (ZOFRAN) injection 4 mg (4 mg Intravenous  Given 10/20/22 1618)  ondansetron (ZOFRAN) injection 4 mg (4 mg Intravenous Given 10/20/22 1803)  iohexol (OMNIPAQUE) 350 MG/ML injection 75 mL (75 mLs Intravenous Contrast Given 10/20/22 1917)    ED Course/ Medical Decision Making/ A&P Clinical Course as of 10/20/22 2324  Mon Oct 20, 2022  1609 EKG sinus tachycardia rate 108, intervals are normal, no acute ST or T wave abnormalities, no signs of ischemia or arrhythmia. [JD]  1736 CBC reviewed, hemoglobin similar to prior.  CMP notable for K of 5.3 but hemolyzed.  No EKG changes.  Normal renal function. [JD]    Clinical Course User Index [JD] Fulton Reek, MD                             Medical Decision Making Amount and/or Complexity of Data Reviewed Labs: ordered. Radiology: ordered.  Risk Prescription drug management.   52 year old female with history as above presenting for chronic, nausea, vomiting, dry mouth.  Vital signs reviewed notable for mild tachycardia.  On exam she is overall well-appearing, she has had intermittent nausea and vomiting for a while.  She is maintaining her weight.  She appears slightly dry on exam consistent with history.  Her abdominal exam here is benign, low concern for acute obstruction, appendicitis, cholecystitis.  She does not appear acutely septic.  Obtain lab work, EKG.  She has no chest pain, low concern for anginal equivalent.  Consider possible medication side effect as well, she is on Invega long-acting with does not appear to be a new medication.  She denies taking any over-the-counter medication that should cause this.  Denies current drug or alcohol use.  On reevaluation she is still having nausea and not tolerating p.o.  Additional medications ordered.  Given extensive prior surgery and continued nausea will obtain CT scan to rule early obstruction.  CT scan reviewed, she has moderate stool burden consistent with reported constipation.  She also has signs of esophagitis and gastritis which  is consistent with her symptoms.  She is been taking over-the-counter medication for this.  Will plan to prescribe PPI.  I reviewed her labs, her case 5.3 but is hemolyzed.  EKG is unremarkable.  Urinalysis without signs of infection.  CBC with stable anemia.  On repeat examination she is tolerating p.o., no pain.  I discharged her  with a prescription for Zofran, Protonix.  I recommend she follow-up closely with her PCP for further evaluation.  Strict return precautions given.        Final Clinical Impression(s) / ED Diagnoses Final diagnoses:  Nausea and vomiting, unspecified vomiting type    Rx / DC Orders ED Discharge Orders          Ordered    pantoprazole (PROTONIX) 20 MG tablet  Daily        10/20/22 2057    ondansetron (ZOFRAN-ODT) 4 MG disintegrating tablet  Every 8 hours PRN        10/20/22 2057              Fulton Reek, MD 10/20/22 2324    Rondel Baton, MD 10/22/22 1218

## 2022-10-20 NOTE — Discharge Instructions (Addendum)
You were seen today for nausea and vomiting.  Your CT scan showed some constipation.  You should stop taking the over-the-counter medicine for reflux and start taking this prescription medication.  You can take the prescribed nausea medication as well.  You should call your doctor tomorrow to schedule follow-up.  If develop severe abdominal pain, inability eat or drink you should return to the ED.

## 2022-10-20 NOTE — ED Notes (Signed)
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  Md at bedside, md aware.

## 2022-10-20 NOTE — ED Triage Notes (Signed)
Pt to er via ems, per ems pt is here for dry mouth and also because she thinks that she is starting menopause.  Pt states that she is here for dry mouth since yesterday.

## 2022-10-26 ENCOUNTER — Emergency Department (HOSPITAL_COMMUNITY)
Admission: EM | Admit: 2022-10-26 | Discharge: 2022-10-27 | Disposition: A | Discharge status: Home / Self Care | Payer: Medicare HMO | Attending: Emergency Medicine | Admitting: Emergency Medicine

## 2022-10-26 ENCOUNTER — Other Ambulatory Visit: Payer: Self-pay

## 2022-10-26 DIAGNOSIS — K219 Gastro-esophageal reflux disease without esophagitis: Secondary | ICD-10-CM | POA: Diagnosis not present

## 2022-10-26 DIAGNOSIS — R1084 Generalized abdominal pain: Secondary | ICD-10-CM | POA: Diagnosis present

## 2022-10-26 DIAGNOSIS — Z79899 Other long term (current) drug therapy: Secondary | ICD-10-CM | POA: Diagnosis not present

## 2022-10-26 DIAGNOSIS — I10 Essential (primary) hypertension: Secondary | ICD-10-CM | POA: Diagnosis not present

## 2022-10-26 DIAGNOSIS — K5900 Constipation, unspecified: Secondary | ICD-10-CM | POA: Insufficient documentation

## 2022-10-26 NOTE — ED Triage Notes (Signed)
Pt BIB [facility] from home. Pt c/o abdominal pain and bilateral feet pain. Pt reports belly is bigger than normal and is requesting pregnancy test. Pt reports being constipated for 2 weeks. Pt c/o N+V/coughing for past 24hrs. Pt aox4 148/84 100RA 120CBG 106HR

## 2022-10-27 DIAGNOSIS — R1084 Generalized abdominal pain: Secondary | ICD-10-CM | POA: Diagnosis not present

## 2022-10-27 LAB — URINALYSIS, ROUTINE W REFLEX MICROSCOPIC
Bilirubin Urine: NEGATIVE
Glucose, UA: NEGATIVE mg/dL
Hgb urine dipstick: NEGATIVE
Ketones, ur: 5 mg/dL — AB
Nitrite: NEGATIVE
Protein, ur: NEGATIVE mg/dL
Specific Gravity, Urine: 1.025 (ref 1.005–1.030)
pH: 5 (ref 5.0–8.0)

## 2022-10-27 LAB — PREGNANCY, URINE: Preg Test, Ur: NEGATIVE

## 2022-10-27 NOTE — Discharge Instructions (Signed)
Please follow-up with your primary doctor this week to evaluate your complaints

## 2022-10-27 NOTE — ED Provider Notes (Signed)
[place] EMERGENCY DEPARTMENT AT [facility] Provider Note   CSN: [id number] Arrival date & time: 10/26/22  2326     History  Chief Complaint  Patient presents with   Abdominal Pain    Melissa Dougherty is a 52 y.o. female.  The history is provided by the patient.  Patient with history of bipolar disorder presents with multiple complaints.  Patient reports she cannot walk.  She reports she has a problem with her electrolytes.  She reports she has had abdominal pain and constipation for weeks.  She is also requesting a pregnancy test.  She also reports recent vomiting.    Past Medical History:  Diagnosis Date   Alcohol abuse    Allergy    Bipolar disorder (HCC)    Depression    Drug abuse (HCC)    GERD (gastroesophageal reflux disease)    Heart murmur    Hypertension    Seizures (HCC)    Last seizure was in 1989    Home Medications Prior to Admission medications   Medication Sig Start Date End Date Taking? Authorizing Provider  acetaminophen (TYLENOL) 325 MG tablet Take 2 tablets (650 mg total) by mouth every 6 (six) hours as needed. 10/15/22   Rising, Lurena Joiner, PA-C  INVEGA SUSTENNA 156 MG/ML SUSY injection Inject 156 mg into the muscle every 30 (thirty) days. 10/26/21   [provider]  LORazepam (ATIVAN) 1 MG tablet Take 1 tablet (1 mg total) by mouth 2 (two) times daily as needed for anxiety. 10/15/22   Lauree Chandler, NP  ondansetron (ZOFRAN-ODT) 4 MG disintegrating tablet Take 1 tablet (4 mg total) by mouth every 8 (eight) hours as needed for nausea or vomiting. 10/20/22   Fulton Reek, MD  pantoprazole (PROTONIX) 20 MG tablet Take 1 tablet (20 mg total) by mouth daily. 10/20/22   Fulton Reek, MD  traZODone (DESYREL) 50 MG tablet Take 1 tablet (50 mg total) by mouth at bedtime as needed for sleep. 10/15/22   Lauree Chandler, NP  benztropine (COGENTIN) 0.5 MG tablet Take 0.5 mg by mouth at bedtime. 01/17/19 05/31/19  [provider]   omeprazole (PRILOSEC) 20 MG capsule Take 20 mg by mouth 2 (two) times daily before a meal.  03/03/19 05/31/19  [provider]  rizatriptan (MAXALT) 10 MG tablet Take 1 tablet by mouth at the onset of headache. May repeat in 2 hours if needed Patient not taking: Reported on 01/31/2019 01/02/16 05/31/19  Veryl Speak, FNP      Allergies    Patient has no known allergies.    Review of Systems   Review of Systems  Physical Exam Updated Vital Signs BP (!) 146/83   Pulse 100   Temp 98.4 F (36.9 C) (Oral)   Resp (!) 22   SpO2 99%  Physical Exam CONSTITUTIONAL: Well developed/well nourished HEAD: Normocephalic/atraumatic ENMT: Mucous membranes moist CV: S1/S2 noted, no murmurs/rubs/gallops noted LUNGS: Lungs are clear to auscultation bilaterally, no apparent distress ABDOMEN: soft, nontender, no rebound or guarding, bowel sounds noted throughout abdomen NEURO: Pt is awake/alert/appropriate, moves all extremitiesx4.  No facial droop.  No arm or leg drift EXTREMITIES:  full ROM, no obvious deformities SKIN: warm, color normal   ED Results / Procedures / Treatments   Labs (all labs ordered are listed, but only abnormal results are displayed) Labs Reviewed  URINALYSIS, ROUTINE W REFLEX MICROSCOPIC - Abnormal; Notable for the following components:      Result Value   Ketones,  ur 5 (*)    Leukocytes,Ua TRACE (*)    Bacteria, UA MANY (*)    All other components within normal limits  PREGNANCY, URINE    EKG EKG Interpretation  Date/Time:  Sunday October 26 2022 23:40:33 EDT Ventricular Rate:  95 PR Interval:  140 QRS Duration: 71 QT Interval:  336 QTC Calculation: 423 R Axis:   70 Text Interpretation: Sinus rhythm Confirmed by Zadie Rhine ([id number]) on 10/26/2022 11:47:21 PM  Radiology No results found.  Procedures Procedures    Medications Ordered in ED Medications - No data to display  ED Course/ Medical Decision Making/ A&P                              Medical Decision Making Amount and/or Complexity of Data Reviewed Labs: ordered.   Patient presents for her fourth ER visit in the past month She has had multiple evaluations for abdominal pain and vomiting.  She has had multiple negative pregnancy test.  CT imaging from May 27 was unremarkable She is in no acute distress. She mentioned not been able to walk, but nursing staff reports patient is able to ambulate  Urinalysis and pregnancy test here were negative.  Patient will be discharged home        Final Clinical Impression(s) / ED Diagnoses Final diagnoses:  Generalized abdominal pain    Rx / DC Orders ED Discharge Orders     None         Zadie Rhine, MD 10/27/22 [phone]

## 2022-10-28 ENCOUNTER — Other Ambulatory Visit: Payer: Self-pay | Admitting: Cardiology

## 2022-10-28 DIAGNOSIS — Z0000 Encounter for general adult medical examination without abnormal findings: Secondary | ICD-10-CM

## 2022-11-06 ENCOUNTER — Other Ambulatory Visit: Payer: Self-pay

## 2022-11-06 ENCOUNTER — Encounter (HOSPITAL_COMMUNITY): Payer: Self-pay

## 2022-11-06 ENCOUNTER — Emergency Department (HOSPITAL_COMMUNITY)
Admission: EM | Admit: 2022-11-06 | Discharge: 2022-11-06 | Disposition: A | Discharge status: Home / Self Care | Payer: Medicare HMO | Attending: Emergency Medicine | Admitting: Emergency Medicine

## 2022-11-06 DIAGNOSIS — Z760 Encounter for issue of repeat prescription: Secondary | ICD-10-CM | POA: Diagnosis present

## 2022-11-06 MED ORDER — PANTOPRAZOLE SODIUM 20 MG PO TBEC: 20.0000 mg | DELAYED_RELEASE_TABLET | Freq: Every day | ORAL | 1 refills | Status: DC

## 2022-11-06 MED ORDER — ONDANSETRON 4 MG PO TBDP: 4.0000 mg | ORAL_TABLET | Freq: Three times a day (TID) | ORAL | 0 refills | Status: DC | PRN

## 2022-11-06 MED ORDER — TRAZODONE HCL 50 MG PO TABS: 50.0000 mg | ORAL_TABLET | Freq: Every evening | ORAL | Status: DC | PRN

## 2022-11-06 MED ORDER — ACETAMINOPHEN 325 MG PO TABS: 650.0000 mg | ORAL_TABLET | Freq: Four times a day (QID) | ORAL | 0 refills | Status: DC | PRN
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
## 2022-11-06 NOTE — ED Notes (Signed)
Pt declined discharge vitals

## 2022-11-06 NOTE — Discharge Instructions (Signed)
You will need to have your lorazepam refilled by your prescribing physician because this is a controlled substance and I am unable to refill this medication.  I have otherwise ordered all of your medications that he may take them to any pharmacy of your choice.

## 2022-11-06 NOTE — ED Provider Notes (Signed)
[place] EMERGENCY DEPARTMENT AT [facility] Provider Note   CSN: [id number] Arrival date & time: 11/06/22  2210     History  Chief Complaint  Patient presents with   Medication Refill    Melissa Dougherty is a 52 y.o. female.  Who presents emergency department with chief complaint of needing medication refills.  She has no other complaints.  Will refill medications as able.   Medication Refill      Home Medications Prior to Admission medications   Medication Sig Start Date End Date Taking? Authorizing Provider  acetaminophen (TYLENOL) 325 MG tablet Take 2 tablets (650 mg total) by mouth every 6 (six) hours as needed. 10/15/22   Rising, Lurena Joiner, PA-C  INVEGA SUSTENNA 156 MG/ML SUSY injection Inject 156 mg into the muscle every 30 (thirty) days. 10/26/21   [provider]  LORazepam (ATIVAN) 1 MG tablet Take 1 tablet (1 mg total) by mouth 2 (two) times daily as needed for anxiety. 10/15/22   Lauree Chandler, NP  ondansetron (ZOFRAN-ODT) 4 MG disintegrating tablet Take 1 tablet (4 mg total) by mouth every 8 (eight) hours as needed for nausea or vomiting. 10/20/22   Fulton Reek, MD  pantoprazole (PROTONIX) 20 MG tablet Take 1 tablet (20 mg total) by mouth daily. 10/20/22   Fulton Reek, MD  traZODone (DESYREL) 50 MG tablet Take 1 tablet (50 mg total) by mouth at bedtime as needed for sleep. 10/15/22   Lauree Chandler, NP  benztropine (COGENTIN) 0.5 MG tablet Take 0.5 mg by mouth at bedtime. 01/17/19 05/31/19  [provider]  omeprazole (PRILOSEC) 20 MG capsule Take 20 mg by mouth 2 (two) times daily before a meal.  03/03/19 05/31/19  [provider]  rizatriptan (MAXALT) 10 MG tablet Take 1 tablet by mouth at the onset of headache. May repeat in 2 hours if needed Patient not taking: Reported on 01/31/2019 01/02/16 05/31/19  Veryl Speak, FNP      Allergies    Patient has no known allergies.    Review of Systems   Review of  Systems  Physical Exam Updated Vital Signs BP 117/88 (BP Location: Left Arm)   Pulse (!) 101   Temp 98.3 F (36.8 C) (Oral)   Resp 18   Ht 5\' 1"  (1.549 m)   Wt 68 kg   SpO2 97%   BMI 28.33 kg/m  Physical Exam Patient sleeping comfortably No acute distress, Respiratory rate normal Abdomen without distention. No extremity edema No scleral icterus ED Results / Procedures / Treatments   Labs (all labs ordered are listed, but only abnormal results are displayed) Labs Reviewed - No data to display  EKG None  Radiology No results found.  Procedures Procedures    Medications Ordered in ED Medications - No data to display  ED Course/ Medical Decision Making/ A&P                             Medical Decision Making  Here for medication refill.  I have reordered meds that are not controlled.  She will follow-up with her prescribing physician for any controlled substance refills.  Otherwise appropriate for discharge at this time.        Final Clinical Impression(s) / ED Diagnoses Final diagnoses:  None    Rx / DC Orders ED Discharge Orders     None         Phyliss, [place], PA-C 11/06/22  2337    Rozelle Logan, DO 11/21/22 1308

## 2022-11-07 ENCOUNTER — Encounter (HOSPITAL_COMMUNITY): Payer: Self-pay

## 2022-11-07 ENCOUNTER — Emergency Department (HOSPITAL_COMMUNITY)
Admission: EM | Admit: 2022-11-07 | Discharge: 2022-11-07 | Disposition: A | Discharge status: Home / Self Care | Payer: Medicare HMO | Attending: Emergency Medicine | Admitting: Emergency Medicine

## 2022-11-07 DIAGNOSIS — Z760 Encounter for issue of repeat prescription: Secondary | ICD-10-CM | POA: Diagnosis present

## 2022-11-07 NOTE — ED Provider Notes (Signed)
[place] EMERGENCY DEPARTMENT AT [facility] Provider Note   CSN: [id number] Arrival date & time: 11/07/22  0001     History  Chief Complaint  Patient presents with   Medication Refill    Melissa Dougherty is a 52 y.o. female.  The history is provided by the patient.   Patient presents for medication refill.  Patient was just seen in the ER over an hour ago and given prescriptions Patient reports her family asked her to be evaluated again.  She reports that her family thinks "I am crazy" because she lost her phone.  Patient has no new complaints and is requesting discharge    Home Medications Prior to Admission medications   Medication Sig Start Date End Date Taking? Authorizing Provider  acetaminophen (TYLENOL) 325 MG tablet Take 2 tablets (650 mg total) by mouth every 6 (six) hours as needed. 11/06/22   Mckiddy, Abigail, PA-C  INVEGA SUSTENNA 156 MG/ML SUSY injection Inject 156 mg into the muscle every 30 (thirty) days. 10/26/21   [provider]  LORazepam (ATIVAN) 1 MG tablet Take 1 tablet (1 mg total) by mouth 2 (two) times daily as needed for anxiety. 10/15/22   Lauree Chandler, NP  ondansetron (ZOFRAN-ODT) 4 MG disintegrating tablet Take 1 tablet (4 mg total) by mouth every 8 (eight) hours as needed for nausea or vomiting. 11/06/22   Arthor Captain, PA-C  pantoprazole (PROTONIX) 20 MG tablet Take 1 tablet (20 mg total) by mouth daily. 11/06/22   Arthor Captain, PA-C  traZODone (DESYREL) 50 MG tablet Take 1 tablet (50 mg total) by mouth at bedtime as needed for sleep. 11/06/22   Arthor Captain, PA-C  benztropine (COGENTIN) 0.5 MG tablet Take 0.5 mg by mouth at bedtime. 01/17/19 05/31/19  [provider]  omeprazole (PRILOSEC) 20 MG capsule Take 20 mg by mouth 2 (two) times daily before a meal.  03/03/19 05/31/19  [provider]  rizatriptan (MAXALT) 10 MG tablet Take 1 tablet by mouth at the onset of headache. May repeat in 2 hours if  needed Patient not taking: Reported on 01/31/2019 01/02/16 05/31/19  Veryl Speak, FNP      Allergies    Patient has no known allergies.    Review of Systems   Review of Systems  Physical Exam Updated Vital Signs BP (!) 134/90 (BP Location: Right Arm)   Pulse 81   Temp 98.5 F (36.9 C) (Oral)   Resp 18   Ht 1.549 m (5\' 1" )   Wt 68 kg   SpO2 98%   BMI 28.33 kg/m  Physical Exam CONSTITUTIONAL: Disheveled HEAD: Normocephalic/atraumatic NEURO: Pt is awake/alert/appropriate, moves all extremitiesx4.  Patient walking around the room in no distress PSYCH: no abnormalities of mood noted, alert and oriented to situation  ED Results / Procedures / Treatments   Labs (all labs ordered are listed, but only abnormal results are displayed) Labs Reviewed - No data to display  EKG None  Radiology No results found.  Procedures Procedures    Medications Ordered in ED Medications - No data to display  ED Course/ Medical Decision Making/ A&P                             Medical Decision Making  She came back to the ER because her family told her to.  She has no new complaints.  She just got her prescriptions refilled.  She will be discharged  Final Clinical Impression(s) / ED Diagnoses Final diagnoses:  Medication refill    Rx / DC Orders ED Discharge Orders     None         Zadie Rhine, MD 11/07/22 [phone]

## 2022-11-07 NOTE — ED Triage Notes (Signed)
Patient was just discharged from here less than an hour ago for medication refill, states her family/friends wanted her to check in again. Patient has no complaints besides getting medication, has script with previous discharge instructions.

## 2022-11-09 ENCOUNTER — Encounter (HOSPITAL_COMMUNITY): Payer: Self-pay

## 2022-11-09 ENCOUNTER — Emergency Department (HOSPITAL_COMMUNITY)
Admission: EM | Admit: 2022-11-09 | Discharge: 2022-11-09 | Discharge status: Against Medical Advice | Payer: Medicare HMO

## 2022-11-09 ENCOUNTER — Other Ambulatory Visit: Payer: Self-pay

## 2022-11-09 ENCOUNTER — Emergency Department (HOSPITAL_COMMUNITY): Payer: Medicare HMO

## 2022-11-09 ENCOUNTER — Emergency Department (HOSPITAL_COMMUNITY)
Admission: EM | Admit: 2022-11-09 | Discharge: 2022-11-09 | Disposition: A | Discharge status: Home / Self Care | Payer: Medicare HMO | Attending: Emergency Medicine | Admitting: Emergency Medicine

## 2022-11-09 DIAGNOSIS — S39012A Strain of muscle, fascia and tendon of lower back, initial encounter: Secondary | ICD-10-CM | POA: Diagnosis not present

## 2022-11-09 DIAGNOSIS — R220 Localized swelling, mass and lump, head: Secondary | ICD-10-CM

## 2022-11-09 DIAGNOSIS — S00511A Abrasion of lip, initial encounter: Secondary | ICD-10-CM | POA: Insufficient documentation

## 2022-11-09 DIAGNOSIS — T07XXXA Unspecified multiple injuries, initial encounter: Secondary | ICD-10-CM

## 2022-11-09 DIAGNOSIS — I10 Essential (primary) hypertension: Secondary | ICD-10-CM | POA: Insufficient documentation

## 2022-11-09 DIAGNOSIS — Z23 Encounter for immunization: Secondary | ICD-10-CM | POA: Insufficient documentation

## 2022-11-09 DIAGNOSIS — S3992XA Unspecified injury of lower back, initial encounter: Secondary | ICD-10-CM | POA: Diagnosis present

## 2022-11-09 DIAGNOSIS — S0990XA Unspecified injury of head, initial encounter: Secondary | ICD-10-CM | POA: Insufficient documentation

## 2022-11-09 MED ORDER — ACETAMINOPHEN 500 MG PO TABS
1000.0000 mg | ORAL_TABLET | Freq: Once | ORAL | Status: AC
  Administered 2022-11-09: 1000 mg via ORAL
  Filled 2022-11-09: qty 2

## 2022-11-09 MED ORDER — TETANUS-DIPHTH-ACELL PERTUSSIS 5-2.5-18.5 LF-MCG/0.5 IM SUSY
0.5000 mL | PREFILLED_SYRINGE | Freq: Once | INTRAMUSCULAR | Status: AC
  Administered 2022-11-09: 0.5 mL via INTRAMUSCULAR
  Filled 2022-11-09: qty 0.5

## 2022-11-09 MED ORDER — CYCLOBENZAPRINE HCL 10 MG PO TABS
10.0000 mg | ORAL_TABLET | Freq: Once | ORAL | Status: AC
  Administered 2022-11-09: 10 mg via ORAL
  Filled 2022-11-09: qty 1

## 2022-11-09 MED ORDER — IBUPROFEN 600 MG PO TABS: 600.0000 mg | ORAL_TABLET | Freq: Three times a day (TID) | ORAL | 0 refills | Status: AC | PRN

## 2022-11-09 MED ORDER — CYCLOBENZAPRINE HCL 10 MG PO TABS: 10.0000 mg | ORAL_TABLET | Freq: Three times a day (TID) | ORAL | 0 refills | Status: AC | PRN

## 2022-11-09 MED ORDER — LIDOCAINE 5 % EX PTCH: 1.0000 | MEDICATED_PATCH | CUTANEOUS | 0 refills | Status: AC

## 2022-11-09 MED ORDER — KETOROLAC TROMETHAMINE 15 MG/ML IJ SOLN
15.0000 mg | Freq: Once | INTRAMUSCULAR | Status: AC
  Administered 2022-11-09: 15 mg via INTRAMUSCULAR
  Filled 2022-11-09: qty 1
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
## 2022-11-09 NOTE — ED Notes (Signed)
Pt still not responding to be triaged

## 2022-11-09 NOTE — ED Notes (Signed)
Pt not responding to be triaged  
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
## 2022-11-09 NOTE — ED Notes (Signed)
Pt verbalizes understanding of discharge instructions. Opportunity for questions and answers were provided. Pt discharged from the ED.   ?

## 2022-11-10 ENCOUNTER — Encounter (HOSPITAL_COMMUNITY): Payer: Self-pay | Admitting: Emergency Medicine

## 2022-11-10 ENCOUNTER — Other Ambulatory Visit: Payer: Self-pay

## 2022-11-10 ENCOUNTER — Emergency Department (HOSPITAL_COMMUNITY)
Admission: EM | Admit: 2022-11-10 | Discharge: 2022-11-11 | Discharge status: Against Medical Advice | Payer: Medicare HMO | Attending: Emergency Medicine | Admitting: Emergency Medicine

## 2022-11-10 DIAGNOSIS — R519 Headache, unspecified: Secondary | ICD-10-CM | POA: Diagnosis not present

## 2022-11-10 DIAGNOSIS — Z5321 Procedure and treatment not carried out due to patient leaving prior to being seen by health care provider: Secondary | ICD-10-CM | POA: Diagnosis not present

## 2022-11-10 DIAGNOSIS — R0781 Pleurodynia: Secondary | ICD-10-CM | POA: Diagnosis present
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
## 2022-11-10 NOTE — ED Notes (Signed)
Pt stated that she does not want to stay. Pt seen leaving the ED.

## 2022-11-11 ENCOUNTER — Encounter (HOSPITAL_COMMUNITY): Payer: Self-pay

## 2022-11-11 ENCOUNTER — Ambulatory Visit (HOSPITAL_COMMUNITY)
Admission: EM | Admit: 2022-11-11 | Discharge: 2022-11-12 | Disposition: A | Discharge status: Other Acute Inpatient Hospital | Payer: Medicare HMO | Attending: Nurse Practitioner | Admitting: Nurse Practitioner

## 2022-11-11 DIAGNOSIS — Z3202 Encounter for pregnancy test, result negative: Secondary | ICD-10-CM | POA: Diagnosis not present

## 2022-11-11 DIAGNOSIS — R450 Nervousness: Secondary | ICD-10-CM | POA: Diagnosis not present

## 2022-11-11 DIAGNOSIS — F3110 Bipolar disorder, current episode manic without psychotic features, unspecified: Secondary | ICD-10-CM

## 2022-11-11 DIAGNOSIS — F419 Anxiety disorder, unspecified: Secondary | ICD-10-CM | POA: Insufficient documentation

## 2022-11-11 DIAGNOSIS — Z046 Encounter for general psychiatric examination, requested by authority: Secondary | ICD-10-CM | POA: Diagnosis present

## 2022-11-11 DIAGNOSIS — G4700 Insomnia, unspecified: Secondary | ICD-10-CM | POA: Diagnosis not present

## 2022-11-11 LAB — POC URINE PREG, ED

## 2022-11-11 LAB — POCT URINE DRUG SCREEN - MANUAL ENTRY (I-SCREEN)
POC Amphetamine UR: NOT DETECTED
POC Buprenorphine (BUP): NOT DETECTED
POC Cocaine UR: NOT DETECTED
POC Marijuana UR: NOT DETECTED
POC Methadone UR: NOT DETECTED
POC Methamphetamine UR: NOT DETECTED
POC Morphine: NOT DETECTED
POC Oxazepam (BZO): NOT DETECTED
POC Oxycodone UR: NOT DETECTED
POC Secobarbital (BAR): NOT DETECTED

## 2022-11-11 LAB — POCT PREGNANCY, URINE: Preg Test, Ur: NEGATIVE

## 2022-11-11 MED ORDER — MAGNESIUM HYDROXIDE 400 MG/5ML PO SUSP: 30.0000 mL | Freq: Every day | ORAL | Status: DC | PRN

## 2022-11-11 MED ORDER — TRAZODONE HCL 50 MG PO TABS: 50.0000 mg | ORAL_TABLET | Freq: Every evening | ORAL | Status: DC | PRN

## 2022-11-11 MED ORDER — ALUM & MAG HYDROXIDE-SIMETH 200-200-20 MG/5ML PO SUSP: 30.0000 mL | ORAL | Status: DC | PRN

## 2022-11-11 MED ORDER — ACETAMINOPHEN 325 MG PO TABS: 650.0000 mg | ORAL_TABLET | Freq: Four times a day (QID) | ORAL | Status: DC | PRN

## 2022-11-11 MED ORDER — HYDROXYZINE HCL 25 MG PO TABS
25.0000 mg | ORAL_TABLET | Freq: Three times a day (TID) | ORAL | Status: DC | PRN
  Administered 2022-11-11: 25 mg via ORAL
  Filled 2022-11-11 (×2): qty 1
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
## 2022-11-11 NOTE — ED Notes (Signed)
Attempted blood draw x2 on pt. Pt states that she hasn't had a lot of fluids. "They usually draw blood from my hand."
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
## 2022-11-12 ENCOUNTER — Encounter (HOSPITAL_COMMUNITY): Payer: Self-pay | Admitting: Family Medicine

## 2022-11-12 ENCOUNTER — Other Ambulatory Visit: Payer: Self-pay

## 2022-11-12 ENCOUNTER — Inpatient Hospital Stay (HOSPITAL_COMMUNITY)
Admission: AD | Admit: 2022-11-12 | Discharge: 2022-11-26 | DRG: 885 | Disposition: A | Discharge status: Home / Self Care | Payer: Medicare HMO | Source: Intra-hospital | Attending: Psychiatry | Admitting: Psychiatry

## 2022-11-12 DIAGNOSIS — F39 Unspecified mood [affective] disorder: Secondary | ICD-10-CM | POA: Diagnosis not present

## 2022-11-12 DIAGNOSIS — I10 Essential (primary) hypertension: Secondary | ICD-10-CM | POA: Diagnosis present

## 2022-11-12 DIAGNOSIS — F3110 Bipolar disorder, current episode manic without psychotic features, unspecified: Secondary | ICD-10-CM | POA: Diagnosis present

## 2022-11-12 DIAGNOSIS — Z9151 Personal history of suicidal behavior: Secondary | ICD-10-CM | POA: Diagnosis not present

## 2022-11-12 DIAGNOSIS — G4700 Insomnia, unspecified: Secondary | ICD-10-CM | POA: Diagnosis present

## 2022-11-12 DIAGNOSIS — N390 Urinary tract infection, site not specified: Secondary | ICD-10-CM | POA: Diagnosis present

## 2022-11-12 DIAGNOSIS — Z560 Unemployment, unspecified: Secondary | ICD-10-CM | POA: Diagnosis not present

## 2022-11-12 DIAGNOSIS — Z5901 Sheltered homelessness: Secondary | ICD-10-CM

## 2022-11-12 DIAGNOSIS — Z5982 Transportation insecurity: Secondary | ICD-10-CM | POA: Diagnosis not present

## 2022-11-12 DIAGNOSIS — K219 Gastro-esophageal reflux disease without esophagitis: Secondary | ICD-10-CM | POA: Diagnosis present

## 2022-11-12 DIAGNOSIS — Z79899 Other long term (current) drug therapy: Secondary | ICD-10-CM

## 2022-11-12 DIAGNOSIS — S01511A Laceration without foreign body of lip, initial encounter: Principal | ICD-10-CM

## 2022-11-12 DIAGNOSIS — F515 Nightmare disorder: Secondary | ICD-10-CM | POA: Diagnosis present

## 2022-11-12 DIAGNOSIS — F17210 Nicotine dependence, cigarettes, uncomplicated: Secondary | ICD-10-CM | POA: Diagnosis present

## 2022-11-12 DIAGNOSIS — F259 Schizoaffective disorder, unspecified: Principal | ICD-10-CM

## 2022-11-12 DIAGNOSIS — G40909 Epilepsy, unspecified, not intractable, without status epilepticus: Secondary | ICD-10-CM | POA: Diagnosis present

## 2022-11-12 DIAGNOSIS — T7421XA Adult sexual abuse, confirmed, initial encounter: Secondary | ICD-10-CM | POA: Diagnosis present

## 2022-11-12 DIAGNOSIS — Z5941 Food insecurity: Secondary | ICD-10-CM | POA: Diagnosis not present

## 2022-11-12 DIAGNOSIS — F3112 Bipolar disorder, current episode manic without psychotic features, moderate: Secondary | ICD-10-CM | POA: Insufficient documentation

## 2022-11-12 DIAGNOSIS — F419 Anxiety disorder, unspecified: Secondary | ICD-10-CM | POA: Diagnosis present

## 2022-11-12 DIAGNOSIS — F319 Bipolar disorder, unspecified: Secondary | ICD-10-CM | POA: Diagnosis present

## 2022-11-12 DIAGNOSIS — F430 Acute stress reaction: Secondary | ICD-10-CM | POA: Diagnosis present

## 2022-11-12 DIAGNOSIS — Z0441 Encounter for examination and observation following alleged adult rape: Secondary | ICD-10-CM | POA: Diagnosis present

## 2022-11-12 LAB — GLUCOSE, CAPILLARY: Glucose-Capillary: 89 mg/dL (ref 70–99)

## 2022-11-12 MED ORDER — ZIPRASIDONE MESYLATE 20 MG IM SOLR: 20.0000 mg | INTRAMUSCULAR | Status: DC | PRN

## 2022-11-12 MED ORDER — ACETAMINOPHEN 325 MG PO TABS: 650.0000 mg | ORAL_TABLET | Freq: Four times a day (QID) | ORAL | Status: DC | PRN

## 2022-11-12 MED ORDER — LORAZEPAM 1 MG PO TABS: 1.0000 mg | ORAL_TABLET | ORAL | Status: DC | PRN

## 2022-11-12 MED ORDER — ALUM & MAG HYDROXIDE-SIMETH 200-200-20 MG/5ML PO SUSP: 30.0000 mL | ORAL | Status: DC | PRN

## 2022-11-12 MED ORDER — MAGNESIUM HYDROXIDE 400 MG/5ML PO SUSP: 30.0000 mL | Freq: Every day | ORAL | Status: DC | PRN

## 2022-11-12 MED ORDER — LORAZEPAM 1 MG PO TABS: 1.0000 mg | ORAL_TABLET | Freq: Three times a day (TID) | ORAL | Status: DC | PRN

## 2022-11-12 MED ORDER — HYDROXYZINE HCL 25 MG PO TABS
25.0000 mg | ORAL_TABLET | Freq: Once | ORAL | Status: AC
  Administered 2022-11-12: 25 mg via ORAL

## 2022-11-12 MED ORDER — LORAZEPAM 2 MG/ML IJ SOLN: 2.0000 mg | Freq: Three times a day (TID) | INTRAMUSCULAR | Status: DC | PRN

## 2022-11-12 MED ORDER — HALOPERIDOL 5 MG PO TABS
5.0000 mg | ORAL_TABLET | Freq: Three times a day (TID) | ORAL | Status: DC | PRN
  Administered 2022-11-13: 5 mg via ORAL
  Filled 2022-11-12 (×2): qty 1

## 2022-11-12 MED ORDER — HALOPERIDOL LACTATE 5 MG/ML IJ SOLN
5.0000 mg | Freq: Three times a day (TID) | INTRAMUSCULAR | Status: DC | PRN
  Administered 2022-11-19: 5 mg via INTRAMUSCULAR
  Filled 2022-11-12: qty 1

## 2022-11-12 MED ORDER — OLANZAPINE 10 MG PO TBDP
10.0000 mg | ORAL_TABLET | Freq: Three times a day (TID) | ORAL | Status: DC | PRN
  Filled 2022-11-12: qty 1

## 2022-11-12 MED ORDER — HYDROXYZINE HCL 25 MG PO TABS: 25.0000 mg | ORAL_TABLET | Freq: Three times a day (TID) | ORAL | Status: DC | PRN

## 2022-11-12 MED ORDER — PALIPERIDONE PALMITATE ER 156 MG/ML IM SUSY: 156.0000 mg | PREFILLED_SYRINGE | Freq: Once | INTRAMUSCULAR | Status: DC

## 2022-11-12 MED ORDER — LORAZEPAM 1 MG PO TABS
2.0000 mg | ORAL_TABLET | Freq: Three times a day (TID) | ORAL | Status: DC | PRN
  Administered 2022-11-13: 2 mg via ORAL
  Filled 2022-11-12: qty 2

## 2022-11-12 NOTE — Plan of Care (Signed)
  Problem: Health Behavior/Discharge Planning: Goal: Identification of resources available to assist in meeting health care needs will improve Outcome: Progressing   Problem: Health Behavior/Discharge Planning: Goal: Compliance with treatment plan for underlying cause of condition will improve Outcome: Progressing
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
## 2022-11-12 NOTE — Discharge Instructions (Signed)
Accepted to [facility]

## 2022-11-12 NOTE — ED Notes (Signed)
Pt pacing unit area. Denies concerns. Talkative with staff. Denies SI/HI/AVH. Pleasant and cooperative. Informed pt to notify staff with any needs or concerns. Will continue to monitor for safety.

## 2022-11-12 NOTE — ED Notes (Signed)
Writer attempted blood draw x2 unsuccessfully. Provider made aware. Encouraged pt to drink more fluids. Safety maintained.
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
## 2022-11-12 NOTE — Progress Notes (Signed)
Patient looks dizzy and sleepy. Blood pressure is normal and blood sugar was 89. Provided snacks and Gatorade. Will continue to monitor Patient got agitated,angry and was pacing at 0344. She became argumentative with demanding voice asking for her clothes from her locker which is not mentioned in the belonging list. Tried to de-escalate and convince the patient but she became agitated. Administered Haldol 5 mg and Ativan 2 mg @0353  as per agitation protocol.    11/12/22 2100  Psych Admission Type (Psych Patients Only)  Admission Status Involuntary  Psychosocial Assessment  Patient Complaints Malaise  Eye Contact Brief  Facial Expression Flat  Affect Appropriate to circumstance  Speech Slow  Interaction Assertive  Motor Activity Lethargic  Appearance/Hygiene Unremarkable  Behavior Characteristics Cooperative  Mood Apathetic  Thought Process  Coherency WDL  Content WDL  Delusions None reported or observed  Perception WDL  Hallucination None reported or observed  Judgment Poor  Confusion None  Danger to Self  Current suicidal ideation? Denies  Agreement Not to Harm Self Yes  Description of Agreement verbal  Danger to Others  Danger to Others None reported or observed

## 2022-11-12 NOTE — ED Notes (Signed)
Pt awake at this hour. Pt pacing the unit because she says she cannot sleep with the bright lights on and the other patient on the unit is snoring. No apparent distress. RR even and unlabored. Monitored for safety.

## 2022-11-12 NOTE — BH Assessment (Addendum)
Disposition Note (Update): Per Joaquin Courts, patient meets criteria for inpatient treatment. [facility] (Danika, RN) assigned patient to bed 402-1. Report can be called to [phone]. Bed is available. Nicolette Bang, RN and Dairl Ponder, RN provided disposition updates.   Disposition Note:  Patient meets criteria for inpatient treatment. [facility] (Danika, RN) reviewing for a bed admission to [facility].

## 2022-11-12 NOTE — ED Notes (Signed)
Pt currently in shower. Hygiene supplies provided by staff. No acute distress noted. Safety maintained.

## 2022-11-12 NOTE — ED Notes (Signed)
Report called and given to Rozell Searing, RN @[facility] . [employer] called for transport. Pt made aware of POC and agreeable to transport. Safety maintained.

## 2022-11-12 NOTE — ED Notes (Addendum)
Patient transporting at this time to [facility] via [employer]. Patient calm and cooperative. Valuables/belongings sent with [employer] and patient. Emtala, IVC, and other transfer paperwork provided in brown envelope. Patient stable and in no current distress.

## 2022-11-12 NOTE — [facility] Group Notes (Signed)
Adult Psychoeducational Group Note  Date:  11/12/2022 Time:  9:48 PM  Group Topic/Focus:  Wrap-Up Group:   The focus of this group is to help patients review their daily goal of treatment and discuss progress on daily workbooks.  Participation Level:  Active  Participation Quality:  Appropriate  Affect:  Appropriate  Cognitive:  Appropriate  Insight: Appropriate  Engagement in Group:  Engaged  Modes of Intervention:  Discussion  Additional Comments:  Pt attended the evening NA meeting.  Christ Kick 11/12/2022, 9:48 PM
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
## 2022-11-12 NOTE — ED Notes (Signed)
Pt still c/o itching. Pt also walking the unit. Says she can't sleep because another pt is snoring. Provider notified about the itching. New orders placed.

## 2022-11-13 ENCOUNTER — Encounter (HOSPITAL_COMMUNITY): Payer: Self-pay | Admitting: Family Medicine

## 2022-11-13 ENCOUNTER — Ambulatory Visit (HOSPITAL_COMMUNITY)
Admission: EM | Admit: 2022-11-13 | Discharge: 2022-11-13 | Disposition: A | Discharge status: Home / Self Care | Payer: No Typology Code available for payment source | Source: Ambulatory Visit | Attending: Emergency Medicine | Admitting: Emergency Medicine

## 2022-11-13 DIAGNOSIS — T7421XA Adult sexual abuse, confirmed, initial encounter: Secondary | ICD-10-CM | POA: Diagnosis present

## 2022-11-13 DIAGNOSIS — Z0441 Encounter for examination and observation following alleged adult rape: Secondary | ICD-10-CM | POA: Diagnosis not present

## 2022-11-13 DIAGNOSIS — F1021 Alcohol dependence, in remission: Secondary | ICD-10-CM | POA: Insufficient documentation

## 2022-11-13 LAB — GLUCOSE, CAPILLARY
Glucose-Capillary: 81 mg/dL (ref 70–99)
Glucose-Capillary: 89 mg/dL (ref 70–99)

## 2022-11-13 MED ORDER — LORAZEPAM 1 MG PO TABS
1.0000 mg | ORAL_TABLET | Freq: Three times a day (TID) | ORAL | Status: DC | PRN
  Filled 2022-11-13: qty 1

## 2022-11-13 MED ORDER — LORAZEPAM 2 MG/ML IJ SOLN
1.0000 mg | Freq: Three times a day (TID) | INTRAMUSCULAR | Status: DC | PRN
  Administered 2022-11-19: 1 mg via INTRAMUSCULAR
  Filled 2022-11-13: qty 1

## 2022-11-13 MED ORDER — POLYETHYLENE GLYCOL 3350 17 G PO PACK: 17.0000 g | PACK | Freq: Every day | ORAL | Status: DC | PRN

## 2022-11-13 MED ORDER — BISMUTH SUBSALICYLATE 262 MG PO CHEW: 524.0000 mg | CHEWABLE_TABLET | ORAL | Status: DC | PRN

## 2022-11-13 MED ORDER — PALIPERIDONE PALMITATE ER 156 MG/ML IM SUSY: 156.0000 mg | PREFILLED_SYRINGE | Freq: Once | INTRAMUSCULAR | Status: DC

## 2022-11-13 MED ORDER — ACETAMINOPHEN 325 MG PO TABS
650.0000 mg | ORAL_TABLET | Freq: Four times a day (QID) | ORAL | Status: DC | PRN
  Administered 2022-11-15 – 2022-11-25 (×4): 650 mg via ORAL
  Filled 2022-11-13 (×4): qty 2

## 2022-11-13 MED ORDER — ALUM & MAG HYDROXIDE-SIMETH 200-200-20 MG/5ML PO SUSP: 30.0000 mL | ORAL | Status: DC | PRN

## 2022-11-13 MED ORDER — SENNA 8.6 MG PO TABS: 1.0000 | ORAL_TABLET | Freq: Every evening | ORAL | Status: DC | PRN

## 2022-11-13 MED ORDER — PANTOPRAZOLE SODIUM 20 MG PO TBEC
20.0000 mg | DELAYED_RELEASE_TABLET | Freq: Every day | ORAL | Status: DC
  Administered 2022-11-13 – 2022-11-26 (×14): 20 mg via ORAL
  Filled 2022-11-13 (×17): qty 1

## 2022-11-13 MED ORDER — CEFTRIAXONE SODIUM 500 MG IJ SOLR
500.0000 mg | INTRAMUSCULAR | Status: AC
  Administered 2022-11-13: 500 mg via INTRAMUSCULAR
  Filled 2022-11-13 (×2): qty 500

## 2022-11-13 MED ORDER — METRONIDAZOLE 500 MG PO TABS
500.0000 mg | ORAL_TABLET | Freq: Two times a day (BID) | ORAL | Status: AC
  Administered 2022-11-13 – 2022-11-20 (×14): 500 mg via ORAL
  Filled 2022-11-13 (×14): qty 1

## 2022-11-13 MED ORDER — ONDANSETRON HCL 4 MG PO TABS: 8.0000 mg | ORAL_TABLET | Freq: Three times a day (TID) | ORAL | Status: DC | PRN

## 2022-11-13 MED ORDER — HYDROXYZINE HCL 25 MG PO TABS
25.0000 mg | ORAL_TABLET | Freq: Three times a day (TID) | ORAL | Status: DC | PRN
  Administered 2022-11-14 – 2022-11-22 (×10): 25 mg via ORAL
  Filled 2022-11-13 (×10): qty 1

## 2022-11-13 MED ORDER — DOXYCYCLINE HYCLATE 100 MG PO TABS
100.0000 mg | ORAL_TABLET | Freq: Two times a day (BID) | ORAL | Status: AC
  Administered 2022-11-13 – 2022-11-23 (×20): 100 mg via ORAL
  Filled 2022-11-13 (×22): qty 1
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
## 2022-11-13 NOTE — Progress Notes (Signed)
   11/13/22 1000  Psych Admission Type (Psych Patients Only)  Admission Status Involuntary  Psychosocial Assessment  Patient Complaints None  Eye Contact Fair  Facial Expression Animated  Affect Appropriate to circumstance;Preoccupied  Speech Slow  Interaction Assertive  Motor Activity Other (Comment) (WDL)  Appearance/Hygiene Unremarkable  Behavior Characteristics Cooperative;Appropriate to situation  Mood Pleasant;Preoccupied  Thought Process  Coherency WDL  Content WDL  Delusions None reported or observed  Perception WDL  Hallucination None reported or observed  Judgment Poor  Confusion None  Danger to Self  Current suicidal ideation? Denies  Agreement Not to Harm Self Yes  Description of Agreement verbal  Danger to Others  Danger to Others None reported or observed

## 2022-11-13 NOTE — Progress Notes (Signed)
Brief Pharmacy Note re: Invega injection.  Called Vesta Mixer [phone]): verified with staff that patient did receive Invega 156mg  injection on Tuesday, 6/18.  Updated the physician.  Otho Bellows PharmD 11/13/2022, 12:00 PM

## 2022-11-13 NOTE — [facility] Group Notes (Signed)
[facility] Group Notes:  (Nursing/MHT/Case Management/Adjunct)  Date:  11/13/2022  Time:  9:04 PM  Type of Therapy:   Wrap up group  Participation Level:  Active  Participation Quality:  Appropriate and Sharing  Affect:  Appropriate  Cognitive:  Alert and Appropriate  Insight:  Good  Engagement in Group:  Engaged  Modes of Intervention:  Discussion  Summary of Progress/Problems: Pt shared with the group how she slept a lot on today.  She is expecting to be discharged home on tomorrow.  Annell Greening [place] 11/13/2022, 9:04 PM

## 2022-11-13 NOTE — Consult Note (Signed)
The SANE/FNE (Forensic Nurse Examiner) consult has been completed. The primary RN and/or provider have been notified. Please contact the SANE/FNE nurse on call (listed in Amion) with any further concerns.  
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
## 2022-11-13 NOTE — Group Note (Signed)
Date:  11/13/2022 Time:  12:10 PM  Group Topic/Focus:  Identifying Needs:   The focus of this group is to help patients identify their personal needs that have been historically problematic and identify healthy behaviors to address their needs. Overcoming Stress:   The focus of this group is to define stress and help patients assess their triggers.    Participation Level:  Did Not Attend  Participation Quality:    Affect:    Cognitive:    Insight:   Engagement in Group:    Modes of Intervention:    Additional Comments:    Rivaldo Hineman M Illiana Losurdo 11/13/2022, 12:10 PM  
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
## 2022-11-14 ENCOUNTER — Encounter (HOSPITAL_COMMUNITY): Payer: Self-pay

## 2022-11-14 DIAGNOSIS — F430 Acute stress reaction: Secondary | ICD-10-CM | POA: Diagnosis present

## 2022-11-14 DIAGNOSIS — F4311 Post-traumatic stress disorder, acute: Secondary | ICD-10-CM | POA: Insufficient documentation

## 2022-11-14 LAB — CBC WITH DIFFERENTIAL/PLATELET
Abs Immature Granulocytes: 0.02 10*3/uL (ref 0.00–0.07)
Basophils Absolute: 0 10*3/uL (ref 0.0–0.1)
Basophils Relative: 1 %
Eosinophils Absolute: 0.2 10*3/uL (ref 0.0–0.5)
Eosinophils Relative: 3 %
HCT: 39.4 % (ref 36.0–46.0)
Hemoglobin: 12 g/dL (ref 12.0–15.0)
Immature Granulocytes: 0 %
Lymphocytes Relative: 33 %
Lymphs Abs: 1.6 10*3/uL (ref 0.7–4.0)
MCH: 21.1 pg — ABNORMAL LOW (ref 26.0–34.0)
MCHC: 30.5 g/dL (ref 30.0–36.0)
MCV: 69.1 fL — ABNORMAL LOW (ref 80.0–100.0)
Monocytes Absolute: 0.2 10*3/uL (ref 0.1–1.0)
Monocytes Relative: 5 %
Neutro Abs: 2.9 10*3/uL (ref 1.7–7.7)
Neutrophils Relative %: 58 %
Platelets: 194 10*3/uL (ref 150–400)
RBC: 5.7 MIL/uL — ABNORMAL HIGH (ref 3.87–5.11)
RDW: 19.5 % — ABNORMAL HIGH (ref 11.5–15.5)
WBC: 4.9 10*3/uL (ref 4.0–10.5)
nRBC: 0 % (ref 0.0–0.2)

## 2022-11-14 LAB — GLUCOSE, CAPILLARY: Glucose-Capillary: 104 mg/dL — ABNORMAL HIGH (ref 70–99)

## 2022-11-14 LAB — LIPID PANEL
Cholesterol: 131 mg/dL (ref 0–200)
HDL: 52 mg/dL (ref >40)
LDL Cholesterol: 70 mg/dL (ref 0–99)
Total CHOL/HDL Ratio: 2.5 RATIO
Triglycerides: 43 mg/dL (ref <150)
VLDL: 9 mg/dL (ref 0–40)

## 2022-11-14 LAB — COMPREHENSIVE METABOLIC PANEL
ALT: 29 U/L (ref 0–44)
AST: 26 U/L (ref 15–41)
Albumin: 3.6 g/dL (ref 3.5–5.0)
Alkaline Phosphatase: 66 U/L (ref 38–126)
Anion gap: 11 (ref 5–15)
BUN: 7 mg/dL (ref 6–20)
CO2: 22 mmol/L (ref 22–32)
Calcium: 8.6 mg/dL — ABNORMAL LOW (ref 8.9–10.3)
Chloride: 102 mmol/L (ref 98–111)
Creatinine, Ser: 0.73 mg/dL (ref 0.44–1.00)
GFR, Estimated: >60 mL/min (ref >60)
Glucose, Bld: 93 mg/dL (ref 70–99)
Potassium: 3.7 mmol/L (ref 3.5–5.1)
Sodium: 135 mmol/L (ref 135–145)
Total Bilirubin: 0.5 mg/dL (ref 0.3–1.2)
Total Protein: 7.3 g/dL (ref 6.5–8.1)

## 2022-11-14 LAB — HEPATITIS PANEL, ACUTE
HCV Ab: NONREACTIVE
Hep A IgM: NONREACTIVE
Hep B C IgM: NONREACTIVE
Hepatitis B Surface Ag: NONREACTIVE

## 2022-11-14 LAB — RPR: RPR Ser Ql: NONREACTIVE

## 2022-11-14 LAB — RAPID HIV SCREEN (HIV 1/2 AB+AG)
HIV 1/2 Antibodies: NONREACTIVE
HIV-1 P24 Antigen - HIV24: NONREACTIVE

## 2022-11-14 LAB — TSH: TSH: 0.491 u[IU]/mL (ref 0.350–4.500)

## 2022-11-14 LAB — PREGNANCY, URINE: Preg Test, Ur: NEGATIVE

## 2022-11-14 MED ORDER — DIPHENHYDRAMINE HCL 25 MG PO CAPS
25.0000 mg | ORAL_CAPSULE | Freq: Every evening | ORAL | Status: DC | PRN
  Administered 2022-11-16 – 2022-11-23 (×5): 25 mg via ORAL
  Filled 2022-11-14 (×6): qty 1

## 2022-11-14 NOTE — Group Note (Signed)
Date:  11/14/2022 Time:  1:23 PM  Group Topic/Focus:  Goals Group:   The focus of this group is to help patients establish daily goals to achieve during treatment and discuss how the patient can incorporate goal setting into their daily lives to aide in recovery.    Participation Level:  Active  Participation Quality:  Appropriate  Affect:  Blunted  Cognitive:  Confused  Insight: Lacking  Engagement in Group:  Monopolizing  Modes of Intervention:  Discussion  Additional Comments:     Reymundo Poll 11/14/2022, 1:23 PM

## 2022-11-14 NOTE — Progress Notes (Signed)
Patient denies SI, HI and AVH rates depression, anxiety and hopelessness all 0/10. Patient states she wants to work on more coping skills. Patient was calm and cooperative throughout the morning and became more agitated after lunch stating she needed to be discharged so that she can go move her stuff from "Jesse's house" to the group home where she will be living. She also needs to make it to church on Sunday and school on Monday. Patient is redirectable but angry. Patient remains safe on the unit. Q 115 minute safety checks ongoing.   11/14/22 1000  Psych Admission Type (Psych Patients Only)  Admission Status Involuntary  Psychosocial Assessment  Patient Complaints None  Eye Contact Fair  Facial Expression Animated  Affect Appropriate to circumstance  Speech Logical/coherent  Interaction Assertive  Motor Activity Slow  Appearance/Hygiene Unremarkable  Behavior Characteristics Cooperative;Appropriate to situation  Mood Pleasant  Thought Process  Coherency WDL  Content WDL  Delusions None reported or observed  Perception WDL  Hallucination None reported or observed  Judgment Poor  Confusion None  Danger to Self  Current suicidal ideation? Denies  Agreement Not to Harm Self Yes  Description of Agreement verbal  Danger to Others  Danger to Others None reported or observed
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
## 2022-11-14 NOTE — Group Note (Signed)
Recreation Therapy Group Note   Group Topic:Team Building  Group Date: 11/14/2022 Start Time: 0930 End Time: 1005 Facilitators: Aiesha Leland-McCall, LRT,CTRS Location: [address] Dayroom   Goal Area(s) Addresses:  Patient will effectively work with peer towards shared goal.  Patient will identify skills used to make activity successful.  Patient will identify how skills used during activity can be used to reach post d/c goals.    Group Description: Straw Bridge. In teams of 3-5, patients were given 12 plastic drinking straws and an equal length of masking tape. Using the materials provided, patients were instructed to build a free standing bridge-like structure to suspend an everyday item (ex: puzzle box) off of the floor or table surface. All materials were required to be used by the team in their design. LRT facilitated post-activity discussion reviewing team process. Patients were encouraged to reflect how the skills used in this activity can be generalized to daily life post discharge.    Affect/Mood: Appropriate   Participation Level: Engaged   Participation Quality: Independent   Behavior: Appropriate   Speech/Thought Process: Focused   Insight: Good   Judgement: Good   Modes of Intervention: STEM Activity   Patient Response to Interventions:  Engaged   Education Outcome:  Acknowledges education   Clinical Observations/Individualized Feedback: Pt attended and participated in group session.     Plan: Continue to engage patient in RT group sessions 2-3x/week.   Amaia Lavallie-McCall, LRT,CTRS 11/14/2022 12:20 PM

## 2022-11-14 NOTE — Progress Notes (Signed)
D: Pt alert and oriented. Pt rates depression 0/10 and anxiety 0/10.  Pt denies experiencing any SI/HI, or AVH at this time.   A: Scheduled medications administered to pt, per MD orders. Support and encouragement provided. Frequent verbal contact made. Routine safety checks conducted q15 minutes.   R: No adverse drug reactions noted. Pt verbally contracts for safety at this time. Pt complaint with medications and treatment plan. Patient is confused, extremely distracted and labile( Sometimes agitated and sometimes friendly) She slept only 4.25 hours. She wanders in the hallway and going back to her room. Pt remains safe and at this time. She looks drowsy and her morning CBG was 104. Will continue to monitor.  11/14/22 0020  Psych Admission Type (Psych Patients Only)  Admission Status Involuntary  Psychosocial Assessment  Patient Complaints None  Eye Contact Brief  Facial Expression Flat  Affect Inconsistent with thought content;Labile  Speech Argumentative;Aggressive;Incoherent  Interaction Demanding;Dominating  Motor Activity Pacing  Appearance/Hygiene Unremarkable  Behavior Characteristics Pacing  Mood Labile  Thought Process  Coherency WDL  Content WDL  Delusions None reported or observed  Perception WDL  Hallucination None reported or observed  Judgment Poor  Confusion None  Danger to Self  Current suicidal ideation? Denies  Agreement Not to Harm Self Yes  Description of Agreement verbal  Danger to Others  Danger to Others None reported or observed
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
## 2022-11-15 LAB — HEMOGLOBIN A1C
Hgb A1c MFr Bld: 5.9 % — ABNORMAL HIGH (ref 4.8–5.6)
Mean Plasma Glucose: 123 mg/dL

## 2022-11-15 LAB — GLUCOSE, CAPILLARY
Glucose-Capillary: 118 mg/dL — ABNORMAL HIGH (ref 70–99)
Glucose-Capillary: 107 mg/dL — ABNORMAL HIGH (ref 70–99)

## 2022-11-15 MED ORDER — GABAPENTIN 300 MG PO CAPS
300.0000 mg | ORAL_CAPSULE | Freq: Once | ORAL | Status: AC
  Administered 2022-11-15: 300 mg via ORAL
  Filled 2022-11-15: qty 1

## 2022-11-15 MED ORDER — GABAPENTIN 300 MG PO CAPS
300.0000 mg | ORAL_CAPSULE | Freq: Two times a day (BID) | ORAL | Status: AC
  Administered 2022-11-16 (×2): 300 mg via ORAL
  Filled 2022-11-15 (×2): qty 1

## 2022-11-15 MED ORDER — PRAZOSIN HCL 1 MG PO CAPS
1.0000 mg | ORAL_CAPSULE | Freq: Every day | ORAL | Status: DC
  Administered 2022-11-15 – 2022-11-25 (×11): 1 mg via ORAL
  Filled 2022-11-15 (×13): qty 1

## 2022-11-15 MED ORDER — GABAPENTIN 300 MG PO CAPS
300.0000 mg | ORAL_CAPSULE | Freq: Three times a day (TID) | ORAL | Status: DC
  Administered 2022-11-17 – 2022-11-18 (×4): 300 mg via ORAL
  Filled 2022-11-15 (×8): qty 1
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
## 2022-11-15 NOTE — Progress Notes (Signed)
Adult Psychoeducational Group Note  Date:  11/15/2022 Time:  9:37 PM  Group Topic/Focus:  Wrap-Up Group:   The focus of this group is to help patients review their daily goal of treatment and discuss progress on daily workbooks.  Participation Level:  Minimal  Participation Quality:  Appropriate  Affect:  Appropriate  Cognitive:  Appropriate  Insight: Appropriate  Engagement in Group:  Engaged  Modes of Intervention:  Discussion  Additional Comments:  Pt. Stated  today was ok, they stated goals for tomorrow is to work on getting out of here.  Joselyn Arrow 11/15/2022, 9:37 PM

## 2022-11-15 NOTE — Progress Notes (Signed)
   11/15/22 1242  Psych Admission Type (Psych Patients Only)  Admission Status Involuntary  Psychosocial Assessment  Patient Complaints Anxiety  Eye Contact Glaring  Facial Expression Animated  Affect Anxious  Speech Logical/coherent  Interaction Assertive  Motor Activity Unsteady  Appearance/Hygiene Unremarkable  Behavior Characteristics Cooperative;Appropriate to situation  Mood Anxious  Thought Process  Coherency Circumstantial  Content WDL  Delusions None reported or observed  Perception WDL  Hallucination None reported or observed  Judgment WDL  Confusion Mild  Danger to Self  Current suicidal ideation? Denies  Agreement Not to Harm Self Yes  Description of Agreement verbal  Danger to Others  Danger to Others None reported or observed

## 2022-11-15 NOTE — [facility] Group Notes (Signed)
[facility] Group Notes:  (Nursing/MHT/Case Management/Adjunct)  Date:  11/15/2022  Time:  9:47 AM  Type of Therapy:  Group Therapy  Participation Level:  Active  Participation Quality:  Appropriate  Affect:  Appropriate  Cognitive:  Appropriate  Insight:  Good  Engagement in Group:  Engaged  Modes of Intervention:  Discussion, Orientation, and Socialization  Summary of Progress/Problems: Pt is working on discharging Monday.  Azalee Course 11/15/2022, 9:47 AM
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
## 2022-11-16 LAB — PROLACTIN: Prolactin: 87.8 ng/mL — ABNORMAL HIGH (ref 3.6–25.2)

## 2022-11-16 MED ORDER — ENSURE ENLIVE PO LIQD
237.0000 mL | Freq: Two times a day (BID) | ORAL | Status: DC
  Administered 2022-11-17 – 2022-11-25 (×17): 237 mL via ORAL
  Filled 2022-11-16 (×23): qty 237

## 2022-11-16 MED ORDER — CLONIDINE HCL 0.1 MG PO TABS
0.1000 mg | ORAL_TABLET | Freq: Every day | ORAL | Status: DC
  Administered 2022-11-16 – 2022-11-17 (×2): 0.1 mg via ORAL
  Filled 2022-11-16 (×4): qty 1

## 2022-11-16 MED ORDER — WHITE PETROLATUM EX OINT
TOPICAL_OINTMENT | CUTANEOUS | Status: AC
  Administered 2022-11-16: 1
  Filled 2022-11-16: qty 5
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
## 2022-11-16 NOTE — Progress Notes (Signed)
   11/16/22 2130  Psych Admission Type (Psych Patients Only)  Admission Status Involuntary  Psychosocial Assessment  Patient Complaints None  Eye Contact Fair  Facial Expression Animated  Affect Preoccupied  Speech Logical/coherent  Interaction Assertive  Motor Activity Slow  Appearance/Hygiene Unremarkable  Behavior Characteristics Cooperative;Appropriate to situation  Mood Pleasant;Euthymic  Thought Process  Coherency Circumstantial  Content WDL  Delusions None reported or observed  Perception WDL  Hallucination None reported or observed  Judgment Impaired  Confusion Mild  Danger to Self  Current suicidal ideation? Denies  Agreement Not to Harm Self Yes  Description of Agreement verbal  Danger to Others  Danger to Others None reported or observed   Pt reports 0/10 depression and anxiety. Pt is pleasant and cooperative. Pt was offered support and encouragement. Pt was given scheduled medications. Given PRN Benadryl per MAR. Q 15 minute checks were done for safety. Pt attended group and interacts well with peers and staff.  Pt has no complaints. Pt receptive to treatment and safety maintained on unit.

## 2022-11-16 NOTE — Group Note (Signed)
Date:  11/16/2022 Time:  12:10 PM  Group Topic/Focus:  Goals Group:   The focus of this group is to help patients establish daily goals to achieve during treatment and discuss how the patient can incorporate goal setting into their daily lives to aide in recovery.    Participation Level:  Active  Participation Quality:  Redirectable  Affect:  Blunted  Cognitive:  Disorganized  Insight: Lacking  Engagement in Group:  Distracting  Modes of Intervention:  Discussion  Additional Comments:     Reymundo Poll 11/16/2022, 12:10 PM

## 2022-11-16 NOTE — Group Note (Signed)
Date:  11/16/2022 Time:  5:15 PM  Group Topic/Focus:  Wellness Toolbox:   The focus of this group is to discuss various aspects of wellness, balancing those aspects and exploring ways to increase the ability to experience wellness.  Patients will create a wellness toolbox for use upon discharge.    Participation Level:  Active  Participation Quality:  Monopolizing  Affect:  Labile  Cognitive:  Disorganized  Insight: Lacking  Engagement in Group:  Monopolizing  Modes of Intervention:  Education  Additional Comments:     Reymundo Poll 11/16/2022, 5:15 PM

## 2022-11-16 NOTE — Progress Notes (Signed)
   11/16/22 0606  15 Minute Checks  Location Dayroom  Visual Appearance Calm  Behavior Composed  Sleep (Behavioral Health Patients Only)  Calculate sleep? (Click Yes once per 24 hr at 0600 safety check) Yes  Documented sleep last 24 hours 2

## 2022-11-16 NOTE — Progress Notes (Signed)
   11/15/22 2050  Psych Admission Type (Psych Patients Only)  Admission Status Involuntary  Psychosocial Assessment  Patient Complaints None  Eye Contact Fair  Facial Expression Animated  Affect Preoccupied  Speech Logical/coherent  Interaction Assertive  Motor Activity Slow  Appearance/Hygiene Unremarkable  Behavior Characteristics Cooperative;Appropriate to situation  Mood Pleasant  Thought Process  Coherency Circumstantial  Content WDL  Delusions None reported or observed  Perception WDL  Hallucination None reported or observed  Judgment Impaired  Confusion Mild  Danger to Self  Current suicidal ideation? Denies  Agreement Not to Harm Self Yes  Description of Agreement verbal  Danger to Others  Danger to Others None reported or observed   Pt was offered support and encouragement. Pt was given scheduled medications. Given PRN Hydroxyzine and Benadryl per MAR. Pt was encouraged to attend groups. Q 15 minute checks were done for safety. Pt attended group and interacts well with peers and staff.  Pt has no complaints.Pt receptive to treatment and safety maintained on unit.

## 2022-11-16 NOTE — Progress Notes (Signed)
   11/16/22 0800  Psych Admission Type (Psych Patients Only)  Admission Status Involuntary  Psychosocial Assessment  Patient Complaints None  Eye Contact Fair  Facial Expression Animated  Affect Preoccupied  Speech Logical/coherent  Interaction Assertive  Motor Activity Slow  Appearance/Hygiene Unremarkable  Behavior Characteristics Cooperative;Appropriate to situation  Mood Pleasant;Anxious  Thought Process  Coherency Circumstantial  Content WDL  Delusions None reported or observed  Perception WDL  Hallucination None reported or observed  Judgment Impaired  Confusion Mild  Danger to Self  Current suicidal ideation? Denies  Agreement Not to Harm Self Yes  Description of Agreement Verbal  Danger to Others  Danger to Others None reported or observed

## 2022-11-16 NOTE — [facility] Group Notes (Signed)
LCSW Wellness Group Note   11/16/2022 09:30am  Type of Group and Topic: Psychoeducational Group:  Wellness  Participation Level:  Active  Description of Group  Wellness group introduces the topic and its focus on developing healthy habits across the spectrum and its relationship to a decrease in hospital admissions.  Six areas of wellness are discussed: physical, social spiritual, intellectual, occupational, and emotional.  Patients are asked to consider their current wellness habits and to identify areas of wellness where they are interested and able to focus on improvements.    Therapeutic Goals Patients will understand components of wellness and how they can positively impact overall health.  Patients will identify areas of wellness where they have developed good habits. Patients will identify areas of wellness where they would like to make improvements.    Summary of Patient Progress: pt quite talkative during group, made multiple comments during group discussion, had to be redirected some in order to not monopolize the time.  Pt did identify financial wellness as an area of strength and physical and emotional wellness as areas that need some work.       Therapeutic Modalities: Cognitive Behavioral Therapy Psychoeducation    Lorri Frederick, LCSW

## 2022-11-17 LAB — GC/CHLAMYDIA PROBE AMP (~~LOC~~) NOT AT ARMC
Chlamydia: NEGATIVE
Comment: NEGATIVE
Comment: NORMAL
Neisseria Gonorrhea: NEGATIVE

## 2022-11-17 MED ORDER — QUETIAPINE FUMARATE 100 MG PO TABS
100.0000 mg | ORAL_TABLET | Freq: Every day | ORAL | Status: DC
  Administered 2022-11-17: 100 mg via ORAL
  Filled 2022-11-17 (×3): qty 1

## 2022-11-17 NOTE — Care Management Important Message (Signed)
Medicare IM printed and given to Melissa Miller, LCSW to give to the patient.  
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
## 2022-11-17 NOTE — Progress Notes (Signed)
   11/16/22 2130  Psych Admission Type (Psych Patients Only)  Admission Status Involuntary  Psychosocial Assessment  Patient Complaints None  Eye Contact Fair  Facial Expression Animated  Affect Preoccupied  Speech Logical/coherent  Interaction Assertive  Motor Activity Slow  Appearance/Hygiene Unremarkable  Behavior Characteristics Cooperative;Appropriate to situation  Mood Pleasant;Euthymic  Thought Process  Coherency Circumstantial  Content WDL  Delusions None reported or observed  Perception WDL  Hallucination None reported or observed  Judgment Impaired  Confusion Mild  Danger to Self  Current suicidal ideation? Denies  Agreement Not to Harm Self Yes  Description of Agreement verbal  Danger to Others  Danger to Others None reported or observed

## 2022-11-17 NOTE — Progress Notes (Signed)
Adult Psychoeducational Group Note  Date:  11/17/2022 Time:  12:49 AM  Group Topic/Focus:  Wrap-Up Group:   The focus of this group is to help patients review their daily goal of treatment and discuss progress on daily workbooks.  Participation Level:  Active  Participation Quality:  Appropriate  Affect:  Appropriate  Cognitive:  Appropriate  Insight: Appropriate  Engagement in Group:  Engaged  Modes of Intervention:  Discussion  Additional Comments:  Pt stated day was a perfect 10. Pt stated today was very positive because they discovered that tomorrow will be going home. Pt. Stated tomorrow goal is to get   Melissa Dougherty 11/17/2022, 12:49 AM

## 2022-11-17 NOTE — Group Note (Signed)
Date:  11/17/2022 Time:  1:00 PM  Group Topic/Focus:  Goals Group:   The focus of this group is to help patients establish daily goals to achieve during treatment and discuss how the patient can incorporate goal setting into their daily lives to aide in recovery.    Participation Level:  Active  Participation Quality:  Intrusive  Affect:  Labile  Cognitive:  Lacking  Insight: Limited  Engagement in Group:  Monopolizing  Modes of Intervention:  Discussion  Additional Comments:     Reymundo Poll 11/17/2022, 1:00 PM

## 2022-11-17 NOTE — Progress Notes (Signed)
   11/17/22 0557  15 Minute Checks  Location Bedroom  Visual Appearance Calm  Behavior Sleeping  Sleep (Behavioral Health Patients Only)  Calculate sleep? (Click Yes once per 24 hr at 0600 safety check) Yes  Documented sleep last 24 hours 4.25

## 2022-11-17 NOTE — Group Note (Signed)
Recreation Therapy Group Note   Group Topic:Problem Solving  Group Date: 11/17/2022 Start Time: 0930 End Time: 1000 Facilitators: Jillaine Waren-McCall, LRT,CTRS Location: [address] Dayroom   Goal Area(s) Addresses:  Patient will effectively work with peer towards shared goal.  Patient will identify skills used to make activity successful.  Patient will share challenges and verbalize solution-driven approaches used. Patient will identify how skills used during activity can be used to reach post d/c goals.   Group Description: [employer]. Patients were provided the following materials: 4 drinking straws, 5 rubber bands, 5 paper clips, 2 index cards and 2 drinking cups. Using the provided materials patients were asked to build a launching mechanism to launch a ping pong ball across the room, approximately 10 feet. Patients were divided into teams of 3-5. Instructions required all materials be incorporated into the device, functionality of items left to the peer group's discretion.   Affect/Mood: Appropriate   Participation Level: Minimal   Participation Quality: Minimal Cues   Behavior: Appropriate and Drowsy   Speech/Thought Process: Distracted   Insight: Moderate   Judgement: Moderate   Modes of Intervention: STEM Activity   Patient Response to Interventions:  Receptive   Education Outcome:  Acknowledges education   Clinical Observations/Individualized Feedback: Pt was engaged at beginning of group.  Pt was also drowsy.  Pt was talking about going to school at [employer].  Pt had moments were she feel asleep.  Pt eventually left group and didn't return.     Plan: Continue to engage patient in RT group sessions 2-3x/week.   Govanni Plemons-McCall, LRT,CTRS 11/17/2022 12:54 PM

## 2022-11-17 NOTE — Progress Notes (Signed)
Adult Psychoeducational Group Note  Date:  11/17/2022 Time:  10:14 PM  Group Topic/Focus:  Wrap-Up Group:   The focus of this group is to help patients review their daily goal of treatment and discuss progress on daily workbooks.  Participation Level:  Active  Participation Quality:  Appropriate  Affect:  Appropriate  Cognitive:  Appropriate  Insight: Appropriate  Engagement in Group:  Engaged  Modes of Intervention:  Discussion  Additional Comments:  Pt . Attended AA meeting.     Joselyn Arrow 11/17/2022, 10:14 PM

## 2022-11-17 NOTE — Group Note (Signed)
Date:  11/17/2022 Time:  2:03 PM  Group Topic/Focus:  Coping With Mental Health Crisis:   The purpose of this group is to help patients identify strategies for coping with mental health crisis.  Group discusses possible causes of crisis and ways to manage them effectively.    Participation Level:  Minimal  Participation Quality:  Monopolizing  Affect:  Labile  Cognitive:  Lacking  Insight: Limited  Engagement in Group:  Monopolizing  Modes of Intervention:  Education  Additional Comments:     Reymundo Poll 11/17/2022, 2:03 PM

## 2022-11-18 DIAGNOSIS — F430 Acute stress reaction: Secondary | ICD-10-CM | POA: Insufficient documentation

## 2022-11-18 DIAGNOSIS — F259 Schizoaffective disorder, unspecified: Principal | ICD-10-CM

## 2022-11-18 DIAGNOSIS — F310 Bipolar disorder, current episode hypomanic: Secondary | ICD-10-CM | POA: Insufficient documentation

## 2022-11-18 MED ORDER — DIVALPROEX SODIUM 250 MG PO DR TAB
250.0000 mg | DELAYED_RELEASE_TABLET | Freq: Three times a day (TID) | ORAL | Status: AC
  Administered 2022-11-18 (×3): 250 mg via ORAL
  Filled 2022-11-18 (×3): qty 1

## 2022-11-18 MED ORDER — DIVALPROEX SODIUM 500 MG PO DR TAB
500.0000 mg | DELAYED_RELEASE_TABLET | Freq: Two times a day (BID) | ORAL | Status: DC
  Administered 2022-11-19: 500 mg via ORAL
  Filled 2022-11-18 (×2): qty 1

## 2022-11-18 MED ORDER — VALPROIC ACID 250 MG PO CAPS
250.0000 mg | ORAL_CAPSULE | Freq: Three times a day (TID) | ORAL | Status: DC
  Filled 2022-11-18 (×3): qty 1

## 2022-11-18 MED ORDER — DIVALPROEX SODIUM ER 500 MG PO TB24
1000.0000 mg | ORAL_TABLET | Freq: Every day | ORAL | Status: DC
  Filled 2022-11-18: qty 2

## 2022-11-18 MED ORDER — QUETIAPINE FUMARATE 200 MG PO TABS
200.0000 mg | ORAL_TABLET | Freq: Every day | ORAL | Status: DC
  Administered 2022-11-18 – 2022-11-19 (×2): 200 mg via ORAL
  Filled 2022-11-18 (×4): qty 1

## 2022-11-18 NOTE — Plan of Care (Signed)
  Problem: Medication: Goal: Compliance with prescribed medication regimen will improve Outcome: Progressing   Problem: Education: Goal: Knowledge of the prescribed therapeutic regimen will improve Outcome: Progressing   Problem: Coping: Goal: Will verbalize feelings Outcome: Progressing

## 2022-11-18 NOTE — Progress Notes (Signed)
   11/18/22 0829  Psych Admission Type (Psych Patients Only)  Admission Status Involuntary  Psychosocial Assessment  Patient Complaints None  Eye Contact Fair  Facial Expression Animated  Affect Preoccupied;Labile  Speech Tangential  Interaction Assertive  Motor Activity Slow  Appearance/Hygiene Unremarkable  Behavior Characteristics Irritable  Mood Labile  Thought Process  Coherency Tangential  Content WDL  Delusions None reported or observed  Perception WDL  Hallucination None reported or observed  Judgment Impaired  Confusion None  Danger to Self  Current suicidal ideation? Denies  Agreement Not to Harm Self Yes  Description of Agreement Verbal  Danger to Others  Danger to Others None reported or observed

## 2022-11-18 NOTE — Progress Notes (Signed)
MD notified of patient elevated heart rates through secure chat.
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
## 2022-11-18 NOTE — Progress Notes (Signed)
   11/18/22 2015  Psych Admission Type (Psych Patients Only)  Admission Status Involuntary  Psychosocial Assessment  Patient Complaints None  Eye Contact Fair  Facial Expression Animated;Anxious  Affect Preoccupied;Labile  Speech Tangential  Interaction Assertive  Motor Activity Slow  Appearance/Hygiene Unremarkable  Behavior Characteristics Anxious;Irritable  Mood Labile  Aggressive Behavior  Effect No apparent injury  Thought Process  Coherency Tangential  Content Blaming others  Delusions None reported or observed  Perception WDL  Hallucination None reported or observed  Judgment Impaired  Confusion WDL  Danger to Self  Current suicidal ideation? Denies
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
## 2022-11-18 NOTE — Progress Notes (Signed)
Did not attend group 

## 2022-11-18 NOTE — [facility] Group Notes (Signed)
Adult Psychoeducational Group Note  Date:  11/18/2022 Time:  10:54 AM  Group Topic/Focus:  Goals Group:   The focus of this group is to help patients establish daily goals to achieve during treatment and discuss how the patient can incorporate goal setting into their daily lives to aide in recovery.  Participation Level:  Active  Participation Quality:  Appropriate  Affect:  Appropriate  Cognitive:  Appropriate  Insight: Good  Engagement in Group:  Engaged  Modes of Intervention:  Discussion  Additional Comments:    Melissa Dougherty 11/18/2022, 10:54 AM

## 2022-11-19 ENCOUNTER — Encounter (HOSPITAL_COMMUNITY): Payer: Self-pay

## 2022-11-19 MED ORDER — DIVALPROEX SODIUM 500 MG PO DR TAB
500.0000 mg | DELAYED_RELEASE_TABLET | Freq: Two times a day (BID) | ORAL | Status: DC
  Administered 2022-11-19 – 2022-11-26 (×14): 500 mg via ORAL
  Filled 2022-11-19 (×18): qty 1
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
## 2022-11-19 NOTE — Plan of Care (Signed)
  Problem: Medication: Goal: Compliance with prescribed medication regimen will improve Outcome: Progressing   Problem: Self-Concept: Goal: Ability to disclose and discuss suicidal ideas will improve Outcome: Progressing   Problem: Coping: Goal: Coping ability will improve Outcome: Not Progressing   Problem: Self-Concept: Goal: Level of anxiety will decrease Outcome: Not Progressing   Problem: Coping: Goal: Ability to verbalize frustrations and anger appropriately will improve Outcome: Not Progressing

## 2022-11-19 NOTE — [facility] Group Notes (Signed)
Pt did not attend group discussion 

## 2022-11-19 NOTE — Progress Notes (Signed)
Patient ID: Melissa Dougherty, female   DOB: 1970/12/20, 52 y.o.   MRN: [id number]   Pt verbally aggressive on the unit, yelling and cursing. Pt telling RN staff that she is ready to leave, "I checked myself in and I can check myself out!" Pt says she has money and can leave on her own. Pt offered PRN medication but refused. RN staff processed with pt and offered PRN medication again. Pt agreed to take medication. Ativan 1 mg IM PRN, and Haldol 5 mg, IM PRN given with no incident.

## 2022-11-19 NOTE — Group Note (Signed)
Recreation Therapy Group Note   Group Topic:Leisure Education  Group Date: 11/19/2022 Start Time: 1001 End Time: 1025 Facilitators: Lacharles Altschuler-McCall, LRT,CTRS Location: [address] Dayroom   Goal Area(s) Addresses:  Patient will identify positive leisure activities for use post discharge. Patient will identify at least one positive benefit of participation in leisure activities.   Group Description: Keep it Contractor.  LRT and patients discussed the importance and benefits of taking time for themselves through leisure. LRT then had patients move their chairs into a circle. LRT gave patients a beach ball and explained they were to keep the ball going for as long as they could without it coming to a stop. Patients were timed and it the ball stopped moving at any point, LRT would reset the time and patients would start over.    Affect/Mood: Appropriate and Elevated   Participation Level: Engaged   Participation Quality: Independent   Behavior: Appropriate and Cooperative   Speech/Thought Process: Relevant and Rational   Insight: Moderate   Judgement: Moderate   Modes of Intervention: Cooperative Play   Patient Response to Interventions:  Engaged   Education Outcome:  Acknowledges education   Clinical Observations/Individualized Feedback: Pt was agitated and upset at beginning of group because she wanted to talk to someone about being discharged. Pt started off aggressively hitting the ball but with some redirection, pt became calmer as group went on. Pt was able to cooperate and able complete the activity.     Plan: Continue to engage patient in RT group sessions 2-3x/week.   Kentrel Clevenger-McCall, LRT,CTRS  11/19/2022 12:56 PM

## 2022-11-19 NOTE — Progress Notes (Signed)
   11/19/22 1006  Psych Admission Type (Psych Patients Only)  Admission Status Involuntary  Psychosocial Assessment  Patient Complaints Worrying;Irritability;Restlessness  Eye Contact Fair  Facial Expression Anxious  Affect Preoccupied;Anxious  Speech Tangential  Interaction Assertive  Motor Activity Slow  Appearance/Hygiene Unremarkable  Behavior Characteristics Cooperative;Anxious;Pacing  Mood Labile;Irritable;Preoccupied;Angry  Thought Process  Coherency Tangential;Concrete thinking  Content Blaming others;Preoccupation  Delusions None reported or observed  Perception WDL  Hallucination None reported or observed  Judgment Impaired  Confusion None  Danger to Self  Current suicidal ideation? Denies  Agreement Not to Harm Self Yes  Description of Agreement verbal  Danger to Others  Danger to Others None reported or observed

## 2022-11-19 NOTE — [facility] Group Notes (Signed)
Adult Psychoeducational Group Note  Date:  11/19/2022 Time:  9:52 AM  Group Topic/Focus:  Goals Group:   The focus of this group is to help patients establish daily goals to achieve during treatment and discuss how the patient can incorporate goal setting into their daily lives to aide in recovery.  Participation Level:  Active  Participation Quality:  Appropriate  Affect:  Appropriate  Cognitive:  Alert  Insight: Appropriate  Engagement in Group:  Engaged  Modes of Intervention:  Orientation  Additional Comments:  MHT went over schedule for today. MHT introduced group with a icebreaker. Pt goal for today is to do what she can to get discharged. Pt plans after discharge is to ger get GED and stay focused on herself.   Dellia Nims 11/19/2022, 9:52 AM
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
## 2022-11-20 ENCOUNTER — Other Ambulatory Visit: Payer: Self-pay

## 2022-11-20 ENCOUNTER — Encounter (HOSPITAL_COMMUNITY): Payer: Self-pay

## 2022-11-20 ENCOUNTER — Encounter (HOSPITAL_COMMUNITY): Payer: Self-pay | Admitting: Family Medicine

## 2022-11-20 DIAGNOSIS — F39 Unspecified mood [affective] disorder: Secondary | ICD-10-CM | POA: Diagnosis not present

## 2022-11-20 DIAGNOSIS — F259 Schizoaffective disorder, unspecified: Secondary | ICD-10-CM

## 2022-11-20 MED ORDER — QUETIAPINE FUMARATE 300 MG PO TABS
300.0000 mg | ORAL_TABLET | Freq: Every day | ORAL | Status: DC
  Administered 2022-11-20 – 2022-11-25 (×6): 300 mg via ORAL
  Filled 2022-11-20 (×8): qty 1

## 2022-11-20 MED ORDER — TETANUS-DIPHTH-ACELL PERTUSSIS 5-2.5-18.5 LF-MCG/0.5 IM SUSY: 0.5000 mL | PREFILLED_SYRINGE | Freq: Once | INTRAMUSCULAR | Status: DC

## 2022-11-20 MED ORDER — CHLORHEXIDINE GLUCONATE 0.12 % MT SOLN: 15.0000 mL | Freq: Two times a day (BID) | OROMUCOSAL | 0 refills | Status: AC

## 2022-11-20 NOTE — Progress Notes (Signed)
Patient had what was described by staff to be a mechanical fall, tripping over her feet while playing basketball outside. She sustained a laceration to the lower right inside of lip. I spoke with EDP that we are sending pt to ED for evaluation and treatment.  Denies dizziness. She did fall onto her face per stuff.  Pending vitals when pt is returned to the psychiatric unit.  Pt is under IVC with will have [facility] sitter. When pt is medically cleared, she can return to [facility].

## 2022-11-20 NOTE — Group Note (Signed)
Recreation Therapy Group Note   Group Topic:Coping Skills  Group Date: 11/20/2022 Start Time: 1012 End Time: 1045 Facilitators: Burel Kahre-McCall, LRT,CTRS Location: 500 Hall Dayroom   Goal Area(s) Addresses: Patient will define what a coping skill is. Patient will successfully identify positive coping skills they can use post d/c.  Patient will acknowledge benefit(s) of using learned coping skills post d/c.    Group Description: Coping A to Z. Patient asked to identify what a coping skill is and when they use them. Patients with Clinical research associate discussed healthy versus unhealthy coping skills. Next patients were given a blank worksheet titled "Coping Skills A-Z". Partners were instructed to come up with at least one positive coping skill per letter of the alphabet. Patients were given 15 minutes to brainstorm before ideas were presented to the large group. Patients and LRT debriefed on the importance of coping skill selection based on situation and back-up plans when a skill tried is not effective. At the end of group, patients were given an handout of alphabetized strategies to keep for future reference.   Affect/Mood: Appropriate   Participation Level: Engaged   Participation Quality: Independent   Behavior: Appropriate   Speech/Thought Process: Focused   Insight: Good   Judgement: Good   Modes of Intervention: Worksheet   Patient Response to Interventions:  Engaged   Education Outcome:  Acknowledges education   Clinical Observations/Individualized Feedback: Pt was engaged and attentive during group session. Pt explained coping skills as "if me and you get into an argument, I have to walk away and comeback later to address the issue". Pt identified some coping skills as breathing, fishing, hydrate, jumping jacks, music, open mouth, running, swimming, zoo and yo-yo. Pt expressed breathing was a big coping skill for her because it helps her calm down and walk away from  situations.    Plan: Continue to engage patient in RT group sessions 2-3x/week.   Melissa Dougherty, LRT,CTRS 11/20/2022 12:28 PM
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
## 2022-11-20 NOTE — Progress Notes (Signed)
Pt returned to [facility] @ 2200 , no report called and pt was not moved off the floor, so pt could be transferred back to [facility] to give pt her HS medications , tried to call [facility] with no answer.

## 2022-11-20 NOTE — Progress Notes (Signed)
Pt currently off unit

## 2022-11-20 NOTE — ED Notes (Signed)
Transport has been called, pt and sitter aware.

## 2022-11-20 NOTE — Group Note (Signed)
Date:  11/20/2022 Time:  10:07 AM  Group Topic/Focus:  Goals Group:   The focus of this group is to help patients establish daily goals to achieve during treatment and discuss how the patient can incorporate goal setting into their daily lives to aide in recovery. Orientation:   The focus of this group is to educate the patient on the purpose and policies of crisis stabilization and provide a format to answer questions about their admission.  The group details unit policies and expectations of patients while admitted.    Participation Level:  Did Not Attend   Sonny Dandy Amber Williard 11/20/2022, 10:07 AM

## 2022-11-20 NOTE — Progress Notes (Addendum)
Pt was in the courtyard and fell down while playing basketball. Pt has a laceration the right side of her bottom lip. Pt denies LOC, dizziness, nausea, or headache. MD Notified.

## 2022-11-20 NOTE — Group Note (Unsigned)
Date:  11/20/2022 Time:  11:50 AM  Group Topic/Focus:  Emotional Education:   The focus of this group is to discuss what feelings/emotions are, and how they are experienced.     Participation Level:  {B[facility] PARTICIPATION LEVEL:22264}  Participation Quality:  {BHH PARTICIPATION QUALITY:22265}  Affect:  {BHH AFFECT:22266}  Cognitive:  {BHH COGNITIVE:22267}  Insight: {BHH Insight2:20797}  Engagement in Group:  {B[facility] ENGAGEMENT IN GROUP:22268}  Modes of Intervention:  {BHH MODES OF INTERVENTION:22269}  Additional Comments:  ***  Nacole Fluhr W Sterling Mondo 11/20/2022, 11:50 AM  
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
## 2022-11-20 NOTE — ED Provider Notes (Signed)
EMERGENCY DEPARTMENT AT [facility] Provider Note   CSN: [id number] Arrival date & time: 11/20/22  1655     History  Chief Complaint  Patient presents with   Melissa Dougherty is a 52 y.o. female.   Fall  Patient is a 52 year old female present emergency room today after she was playing basketball tripped and fell down and struck her lip against the ground.  She has a small no bleeding.  She did not pass out lose consciousness she has no nausea vomiting  No headache chest pain or any joint pain.  She has been walking without difficulty since this occurred.  She is at a behavioral health hospital currently.     Home Medications Prior to Admission medications   Medication Sig Start Date End Date Taking? Authorizing Provider  chlorhexidine (PERIDEX) 0.12 % solution Use as directed 15 mLs in the mouth or throat 2 (two) times daily. 11/20/22  Yes Delano Scardino, Stevphen Meuse S, PA  acetaminophen (TYLENOL) 325 MG tablet Take 2 tablets (650 mg total) by mouth every 6 (six) hours as needed. Patient not taking: Reported on 11/12/2022 11/06/22   Arthor Captain, PA-C  diphenhydrAMINE (BENADRYL) 25 mg capsule Take 50 mg by mouth every 6 (six) hours as needed for itching.    [provider]  INVEGA SUSTENNA 156 MG/ML SUSY injection Inject 156 mg into the muscle every 30 (thirty) days. 10/26/21   [provider]  LORazepam (ATIVAN) 1 MG tablet Take 1 tablet (1 mg total) by mouth 2 (two) times daily as needed for anxiety. Patient not taking: Reported on 11/12/2022 10/15/22   Lauree Chandler, NP  ondansetron (ZOFRAN-ODT) 4 MG disintegrating tablet Take 1 tablet (4 mg total) by mouth every 8 (eight) hours as needed for nausea or vomiting. Patient not taking: Reported on 11/12/2022 11/06/22   Arthor Captain, PA-C  pantoprazole (PROTONIX) 20 MG tablet Take 1 tablet (20 mg total) by mouth daily. 11/06/22   Arthor Captain, PA-C  traZODone (DESYREL) 50 MG tablet Take 1  tablet (50 mg total) by mouth at bedtime as needed for sleep. Patient not taking: Reported on 11/12/2022 11/06/22   Arthor Captain, PA-C  benztropine (COGENTIN) 0.5 MG tablet Take 0.5 mg by mouth at bedtime. 01/17/19 05/31/19  [provider]  omeprazole (PRILOSEC) 20 MG capsule Take 20 mg by mouth 2 (two) times daily before a meal.  03/03/19 05/31/19  [provider]  rizatriptan (MAXALT) 10 MG tablet Take 1 tablet by mouth at the onset of headache. May repeat in 2 hours if needed Patient not taking: Reported on 01/31/2019 01/02/16 05/31/19  Veryl Speak, FNP      Allergies    Trazodone and nefazodone    Review of Systems   Review of Systems  Physical Exam Updated Vital Signs BP 121/76 (BP Location: Left Arm)   Pulse 93   Temp 98.3 F (36.8 C) (Oral)   Resp 14   Ht 5\' 1"  (1.549 m)   Wt 65 kg   SpO2 100%   BMI 27.08 kg/m  Physical Exam Vitals and nursing note reviewed.  Constitutional:      General: She is not in acute distress.    Appearance: Normal appearance. She is not ill-appearing.     Comments: Very pleasant 52 year old female  HENT:     Head: Normocephalic and atraumatic.     Nose: Nose normal.     Mouth/Throat:     Mouth: Mucous membranes  are moist.     Comments: Right side of lower lip with laceration that does not cross the vermilion border it is inside the lower lip. Eyes:     General: No scleral icterus.       Right eye: No discharge.        Left eye: No discharge.     Conjunctiva/sclera: Conjunctivae normal.  Cardiovascular:     Rate and Rhythm: Normal rate and regular rhythm.     Pulses: Normal pulses.     Heart sounds: Normal heart sounds.  Pulmonary:     Effort: Pulmonary effort is normal. No respiratory distress.     Breath sounds: No stridor. No wheezing.  Abdominal:     Palpations: Abdomen is soft.     Tenderness: There is no abdominal tenderness.  Musculoskeletal:     Cervical back: Normal range of motion.     Right lower leg: No  edema.     Left lower leg: No edema.     Comments: No bony tenderness over joints or long bones of the upper and lower extremities.    No neck or back midline tenderness, step-off, deformity, or bruising. Able to turn head left and right 45 degrees without difficulty.  Full range of motion of upper and lower extremity joints shown after palpation was conducted; with 5/5 symmetrical strength in upper and lower extremities. No chest wall tenderness, no facial or cranial tenderness.   Patient has intact sensation grossly in lower and upper extremities. Intact patellar and ankle reflexes. Patient able to ambulate without difficulty.  Radial and DP pulses palpated BL.    Skin:    General: Skin is warm and dry.     Capillary Refill: Capillary refill takes less than 2 seconds.  Neurological:     Mental Status: She is alert and oriented to person, place, and time. Mental status is at baseline.  Psychiatric:        Mood and Affect: Mood normal.        Behavior: Behavior normal.     ED Results / Procedures / Treatments   Labs (all labs ordered are listed, but only abnormal results are displayed) Labs Reviewed  CBC WITH DIFFERENTIAL/PLATELET - Abnormal; Notable for the following components:      Result Value   RBC 5.70 (*)    MCV 69.1 (*)    MCH 21.1 (*)    RDW 19.5 (*)    All other components within normal limits  COMPREHENSIVE METABOLIC PANEL - Abnormal; Notable for the following components:   Calcium 8.6 (*)    All other components within normal limits  PROLACTIN - Abnormal; Notable for the following components:   Prolactin 87.8 (*)    All other components within normal limits  HEMOGLOBIN A1C - Abnormal; Notable for the following components:   Hgb A1c MFr Bld 5.9 (*)    All other components within normal limits  GLUCOSE, CAPILLARY - Abnormal; Notable for the following components:   Glucose-Capillary 104 (*)    All other components within normal limits  GLUCOSE, CAPILLARY -  Abnormal; Notable for the following components:   Glucose-Capillary 118 (*)    All other components within normal limits  GLUCOSE, CAPILLARY - Abnormal; Notable for the following components:   Glucose-Capillary 107 (*)    All other components within normal limits  GLUCOSE, CAPILLARY  PREGNANCY, URINE  GLUCOSE, CAPILLARY  GLUCOSE, CAPILLARY  RPR  TSH  LIPID PANEL  HEPATITIS PANEL, ACUTE  RAPID HIV SCREEN (  HIV 1/2 AB+AG)  CBC WITH DIFFERENTIAL/PLATELET  VALPROIC ACID LEVEL  GC/CHLAMYDIA PROBE AMP ([place]) NOT AT [facility]    EKG None  Radiology No results found.  Procedures Procedures    Medications Ordered in ED Medications  haloperidol (HALDOL) tablet 5 mg ( Oral See Alternative 11/19/22 1059)    Or  haloperidol lactate (HALDOL) injection 5 mg (5 mg Intramuscular Given 11/19/22 1059)  acetaminophen (TYLENOL) tablet 650 mg (650 mg Oral Given 11/20/22 0824)  alum & mag hydroxide-simeth (MAALOX/MYLANTA) 200-200-20 MG/5ML suspension 30 mL (has no administration in time range)  bismuth subsalicylate (PEPTO BISMOL) chewable tablet 524 mg (has no administration in time range)  hydrOXYzine (ATARAX) tablet 25 mg (25 mg Oral Given 11/20/22 1254)  polyethylene glycol (MIRALAX / GLYCOLAX) packet 17 g (has no administration in time range)  senna (SENOKOT) tablet 8.6 mg (has no administration in time range)  ondansetron (ZOFRAN) tablet 8 mg (has no administration in time range)  pantoprazole (PROTONIX) EC tablet 20 mg (20 mg Oral Given 11/20/22 0822)  paliperidone (INVEGA SUSTENNA) injection 156 mg (has no administration in time range)  LORazepam (ATIVAN) tablet 1 mg ( Oral See Alternative 11/19/22 1100)    Or  LORazepam (ATIVAN) injection 1 mg (1 mg Intramuscular Given 11/19/22 1100)  doxycycline (VIBRA-TABS) tablet 100 mg (100 mg Oral Given 11/20/22 0822)  diphenhydrAMINE (BENADRYL) capsule 25 mg (25 mg Oral Given 11/18/22 2318)  prazosin (MINIPRESS) capsule 1 mg (1 mg Oral Given 11/19/22  2046)  feeding supplement (ENSURE ENLIVE / ENSURE PLUS) liquid 237 mL (237 mLs Oral Given 11/20/22 1355)  divalproex (DEPAKOTE) DR tablet 250 mg (250 mg Oral Given 11/18/22 2029)    Followed by  divalproex (DEPAKOTE) DR tablet 500 mg (500 mg Oral Given 11/20/22 1610)  QUEtiapine (SEROQUEL) tablet 300 mg (has no administration in time range)  cefTRIAXone (ROCEPHIN) injection 500 mg (500 mg Intramuscular Given 11/13/22 1701)  metroNIDAZOLE (FLAGYL) tablet 500 mg (500 mg Oral Given 11/20/22 9604)  gabapentin (NEURONTIN) capsule 300 mg (300 mg Oral Given 11/15/22 1034)    Followed by  gabapentin (NEURONTIN) capsule 300 mg (300 mg Oral Given 11/16/22 1702)  white petrolatum (VASELINE) gel (1 Application  Given 11/16/22 0945)    ED Course/ Medical Decision Making/ A&P                             Medical Decision Making Risk Prescription drug management.   Patient is a 52 year old female present emergency room today after she was playing basketball tripped and fell down and struck her lip against the ground.  She has a small no bleeding.  She did not pass out lose consciousness she has no nausea vomiting  No headache chest pain or any joint pain.  She has been walking without difficulty since this occurred.  She is at a behavioral health hospital currently.  Physical exam with internal lip laceration does not cross vermilion border or come particularly close.  From a head trauma standpoint she is well-appearing [place] head CT rules negative /low risk  Physical exam unremarkable apart from lip laceration  This is mechanical fall.  Recommend Peridex and home/return to behavioral health hospital.   Final Clinical Impression(s) / ED Diagnoses Final diagnoses:  Lip laceration, initial encounter    Rx / DC Orders ED Discharge Orders          Ordered    chlorhexidine (PERIDEX) 0.12 % solution  2 times daily  11/20/22 1931              Gailen Shelter, PA 11/20/22 1940     Melissa Plan, MD 11/25/22 [phone]

## 2022-11-20 NOTE — Progress Notes (Signed)
Pt came back from the ED, pt appears to be in no distress and does not mention any pain at this time    11/20/22 2215  Psych Admission Type (Psych Patients Only)  Admission Status Involuntary  Psychosocial Assessment  Patient Complaints None  Eye Contact Fair  Facial Expression Anxious  Affect Preoccupied  Speech Tangential  Interaction Assertive  Motor Activity Slow  Appearance/Hygiene Unremarkable  Behavior Characteristics Cooperative;Anxious  Mood Labile;Suspicious  Aggressive Behavior  Effect No apparent injury  Thought Process  Coherency Concrete thinking;Circumstantial  Content Blaming others;Preoccupation  Delusions None reported or observed  Perception WDL  Hallucination None reported or observed  Judgment WDL  Confusion WDL  Danger to Self  Current suicidal ideation? Denies

## 2022-11-20 NOTE — ED Notes (Signed)
[employer] has followed up to see when transport may arrive.

## 2022-11-20 NOTE — ED Triage Notes (Signed)
Pt arrived POV from Behavior health. Pt is under IVC. Hx of Bipolar. Calm and cooperative on arrival. States can become verbally and physically aggressive. Brought in today for fall while playing basketball, witnessed by staff. Denies LOC. Patient arrive with laceration to Lip. Also arrive with a tech at bedside.

## 2022-11-21 DIAGNOSIS — F259 Schizoaffective disorder, unspecified: Secondary | ICD-10-CM | POA: Diagnosis not present

## 2022-11-21 LAB — VALPROIC ACID LEVEL: Valproic Acid Lvl: 78 ug/mL (ref 50.0–100.0)

## 2022-11-21 NOTE — Progress Notes (Signed)
Pt attended scheduled orientation group. Appears sedated and minimal on interactions. Stated her goal is "To go home".

## 2022-11-21 NOTE — [facility] Group Notes (Signed)
   Spirituality group facilitated by Wilkie Aye, MDiv, [facility].  Group Description: Group focused on topic of hope. Patients participated in facilitated discussion around topic, connecting with one another around experiences and definitions for hope. Group members engaged with visual explorer photos, reflecting on what hope looks like for them today. Group engaged in discussion around how their definitions of hope are present today in hospital.  Modalities: Psycho-social ed, Adlerian, Narrative, MI  Patient Progress: Present throughout group.  Related story of leaving relationship - noting setting boundaries, claiming worth, focusing on what she can control - as elements of hope.

## 2022-11-21 NOTE — Group Note (Signed)
Date:  11/21/2022 Time:  8:36 PM  Group Topic/Focus:  Wrap-Up Group:   The focus of this group is to help patients review their daily goal of treatment and discuss progress on daily workbooks.    Participation Level:  Active  Participation Quality:  Attentive  Affect:  Appropriate  Cognitive:  Appropriate  Insight: Appropriate  Engagement in Group:   none  Modes of Intervention:  Discussion  Additional Comments: Patient said she is getting better with treatment  Melissa Dougherty E Noble Cicalese 11/21/2022, 8:36 PM

## 2022-11-21 NOTE — Group Note (Signed)
Recreation Therapy Group Note   Group Topic:Team Building  Group Date: 11/21/2022 Start Time: 1015 End Time: 1030 Facilitators: Oralia Criger-McCall, LRT,CTRS Location: [address] Day[address]   Goal Area(s) Addresses:  Patient will effectively work with peer towards shared goal.  Patient will identify skills used to make activity successful.  Patient will identify how skills used during activity can be applied to reach post d/c goals.    Group Description: [employer]. In teams of 5-6, patients were given 11 craft pipe cleaners. Using the materials provided, patients were instructed to compete again the opposing team(s) to build the tallest free-standing structure from floor level. The activity was timed; difficulty increased by Clinical research associate as Production designer, theatre/television/film continued.  Systematically resources were removed with additional directions for example, placing one arm behind their back, working in silence, and shape stipulations. LRT facilitated post-activity discussion reviewing team processes and necessary communication skills involved in completion. Patients were encouraged to reflect how the skills utilized, or not utilized, in this activity can be incorporated to positively impact support systems post discharge.   Affect/Mood: N/A   Participation Level: Did not attend    Clinical Observations/Individualized Feedback:     Plan: Continue to engage patient in RT group sessions 2-3x/week.   Hindy Perrault-McCall, LRT,CTRS 11/21/2022 12:51 PM

## 2022-11-21 NOTE — Plan of Care (Signed)
  Problem: Medication: Goal: Compliance with prescribed medication regimen will improve Outcome: Progressing   Problem: Health Behavior/Discharge Planning: Goal: Compliance with therapeutic regimen will improve Outcome: Progressing   Problem: Role Relationship: Goal: Will demonstrate positive changes in social behaviors and relationships Outcome: Progressing   Problem: Activity: Goal: Imbalance in normal sleep/wake cycle will improve Outcome: Not Progressing
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
## 2022-11-21 NOTE — Progress Notes (Signed)
   11/21/22 2015  Psych Admission Type (Psych Patients Only)  Admission Status Involuntary  Psychosocial Assessment  Patient Complaints Sleep disturbance  Eye Contact Fair  Facial Expression Anxious  Affect Preoccupied  Speech Tangential  Interaction Assertive  Motor Activity Slow  Appearance/Hygiene Unremarkable  Behavior Characteristics Cooperative;Anxious  Mood Preoccupied  Aggressive Behavior  Effect No apparent injury  Thought Process  Coherency Concrete thinking;Circumstantial  Content Blaming others;Preoccupation  Delusions None reported or observed  Perception WDL  Hallucination None reported or observed  Judgment WDL  Confusion WDL  Danger to Self  Current suicidal ideation? Denies

## 2022-11-21 NOTE — Progress Notes (Signed)
   11/21/22 0800  Psych Admission Type (Psych Patients Only)  Admission Status Involuntary  Psychosocial Assessment  Patient Complaints Sleep disturbance  Eye Contact Fair  Facial Expression Anxious  Affect Preoccupied;Anxious  Speech Tangential  Interaction Assertive  Motor Activity Slow  Appearance/Hygiene Unremarkable  Behavior Characteristics Cooperative;Anxious  Mood Anxious;Preoccupied  Thought Process  Coherency Tangential;Circumstantial  Content Blaming others;Preoccupation  Delusions None reported or observed  Perception WDL  Hallucination None reported or observed  Judgment WDL  Confusion WDL  Danger to Self  Current suicidal ideation? Denies  Agreement Not to Harm Self Yes  Description of Agreement Verbal  Danger to Others  Danger to Others None reported or observed

## 2022-11-22 MED ORDER — QUETIAPINE FUMARATE 100 MG PO TABS
100.0000 mg | ORAL_TABLET | Freq: Every evening | ORAL | Status: DC | PRN
  Administered 2022-11-25: 100 mg via ORAL
  Filled 2022-11-22: qty 1

## 2022-11-22 NOTE — Progress Notes (Signed)
Pt requested Benadryl for itching, pt given PRN Vistaril per [facility]

## 2022-11-22 NOTE — Progress Notes (Signed)
   11/22/22 0800  Psych Admission Type (Psych Patients Only)  Admission Status Involuntary  Psychosocial Assessment  Patient Complaints Irritability  Eye Contact Fair  Facial Expression Anxious  Affect Preoccupied;Labile  Speech Tangential  Interaction Assertive  Motor Activity Slow  Appearance/Hygiene Unremarkable  Behavior Characteristics Irritable;Cooperative  Mood Preoccupied;Irritable  Thought Process  Coherency Tangential;Circumstantial  Content Blaming others;Preoccupation  Delusions None reported or observed  Perception WDL  Hallucination None reported or observed  Judgment WDL  Confusion WDL  Danger to Self  Current suicidal ideation? Denies  Agreement Not to Harm Self Yes  Description of Agreement Verbal  Danger to Others  Danger to Others None reported or observed
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
## 2022-11-22 NOTE — Group Note (Signed)
Date:  11/22/2022 Time:  8:53 PM  Group Topic/Focus:  Wrap-Up Group:   The focus of this group is to help patients review their daily goal of treatment and discuss progress on daily workbooks.    Participation Level:  Active  Participation Quality:  Appropriate  Affect:  Appropriate  Cognitive:  Appropriate  Insight: Appropriate  Engagement in Group:  Engaged  Modes of Intervention:  Education  Additional Comments:  patient attended and participated in group tonight. She reports that today she learn some coping skills on how to react when someone makes her angry.  Lita Mains [facility] 11/22/2022, 8:53 PM

## 2022-11-22 NOTE — Plan of Care (Signed)
  Problem: Health Behavior/Discharge Planning: Goal: Identification of resources available to assist in meeting health care needs will improve Outcome: Progressing   Problem: Medication: Goal: Compliance with prescribed medication regimen will improve Outcome: Progressing   Problem: Education: Goal: Knowledge of the prescribed therapeutic regimen will improve Outcome: Progressing   

## 2022-11-23 MED ORDER — WHITE PETROLATUM EX OINT
TOPICAL_OINTMENT | CUTANEOUS | Status: AC
  Filled 2022-11-23: qty 5
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
## 2022-11-23 NOTE — Plan of Care (Signed)
  Problem: Health Behavior/Discharge Planning: Goal: Identification of resources available to assist in meeting health care needs will improve Outcome: Progressing   Problem: Medication: Goal: Compliance with prescribed medication regimen will improve Outcome: Progressing   Problem: Education: Goal: Knowledge of the prescribed therapeutic regimen will improve Outcome: Progressing

## 2022-11-23 NOTE — Progress Notes (Signed)
   11/23/22 2100  Psych Admission Type (Psych Patients Only)  Admission Status Involuntary  Psychosocial Assessment  Patient Complaints Anxiety  Eye Contact Fair  Facial Expression Animated  Affect Appropriate to circumstance  Speech Logical/coherent  Interaction Assertive  Motor Activity Slow  Appearance/Hygiene Unremarkable  Behavior Characteristics Calm;Appropriate to situation;Cooperative  Mood Pleasant  Thought Process  Coherency Circumstantial  Content Preoccupation  Delusions None reported or observed  Perception WDL  Hallucination None reported or observed  Judgment Poor  Confusion WDL  Danger to Self  Current suicidal ideation? Denies  Agreement Not to Harm Self Yes  Description of Agreement verbal  Danger to Others  Danger to Others None reported or observed

## 2022-11-23 NOTE — Group Note (Signed)
Date:  11/23/2022 Time:  9:31 PM  Group Topic/Focus:  Wrap-Up Group:   The focus of this group is to help patients review their daily goal of treatment and discuss progress on daily workbooks.    Participation Level:  Active  Participation Quality:  Appropriate  Affect:  Appropriate  Cognitive:  Appropriate  Insight: Appropriate  Engagement in Group:  Engaged  Modes of Intervention:  Education and Exploration  Additional Comments:  Patient attended and participated in group tonight. She reports that today she called a couple of family members. She is looking for a place to live.  Lita Mains [facility] 11/23/2022, 9:31 PM

## 2022-11-23 NOTE — Progress Notes (Signed)
   11/23/22 0735  Psych Admission Type (Psych Patients Only)  Admission Status Involuntary  Psychosocial Assessment  Patient Complaints Anxiety;Worrying  Eye Contact Fair  Facial Expression Anxious  Affect Preoccupied  Speech Logical/coherent  Interaction Assertive  Motor Activity Slow  Appearance/Hygiene Unremarkable  Behavior Characteristics Cooperative;Anxious  Mood Anxious  Thought Process  Coherency Tangential;Circumstantial  Content Blaming others;Preoccupation  Delusions None reported or observed  Perception WDL  Hallucination None reported or observed  Judgment WDL  Confusion WDL  Danger to Self  Current suicidal ideation? Denies  Agreement Not to Harm Self Yes  Description of Agreement Verbal  Danger to Others  Danger to Others None reported or observed

## 2022-11-23 NOTE — Progress Notes (Signed)
   11/22/22 2100  Psych Admission Type (Psych Patients Only)  Admission Status Involuntary  Psychosocial Assessment  Patient Complaints Anxiety  Eye Contact Fair  Facial Expression Animated  Affect Appropriate to circumstance  Speech Logical/coherent  Interaction Assertive  Motor Activity Slow  Appearance/Hygiene Unremarkable  Behavior Characteristics Cooperative;Appropriate to situation  Mood Pleasant  Thought Process  Coherency Circumstantial  Content Preoccupation  Delusions None reported or observed  Perception WDL  Hallucination None reported or observed  Judgment Poor  Confusion WDL  Danger to Self  Current suicidal ideation? Denies  Agreement Not to Harm Self Yes  Description of Agreement verbal  Danger to Others  Danger to Others None reported or observed

## 2022-11-24 ENCOUNTER — Encounter (HOSPITAL_COMMUNITY): Payer: Self-pay

## 2022-11-24 DIAGNOSIS — F259 Schizoaffective disorder, unspecified: Secondary | ICD-10-CM | POA: Diagnosis not present

## 2022-11-24 MED ORDER — TEMAZEPAM 7.5 MG PO CAPS
7.5000 mg | ORAL_CAPSULE | Freq: Every day | ORAL | Status: DC
  Administered 2022-11-24: 7.5 mg via ORAL
  Filled 2022-11-24: qty 1

## 2022-11-24 MED ORDER — RISPERIDONE 1 MG PO TBDP
1.0000 mg | ORAL_TABLET | Freq: Every day | ORAL | Status: DC
  Administered 2022-11-24 – 2022-11-25 (×2): 1 mg via ORAL
  Filled 2022-11-24 (×3): qty 1

## 2022-11-24 MED ORDER — PROPRANOLOL HCL 10 MG PO TABS
10.0000 mg | ORAL_TABLET | Freq: Two times a day (BID) | ORAL | Status: DC
  Administered 2022-11-24 – 2022-11-26 (×5): 10 mg via ORAL
  Filled 2022-11-24 (×9): qty 1

## 2022-11-24 MED ORDER — TEMAZEPAM 7.5 MG PO CAPS: 7.5000 mg | ORAL_CAPSULE | Freq: Every evening | ORAL | Status: DC | PRN

## 2022-11-24 MED ORDER — WHITE PETROLATUM EX OINT
TOPICAL_OINTMENT | CUTANEOUS | Status: AC
  Administered 2022-11-24: 1
  Filled 2022-11-24: qty 5

## 2022-11-24 NOTE — Group Note (Signed)
Recreation Therapy Group Note   Group Topic:Health and Wellness  Group Date: 11/24/2022 Start Time: 1015 End Time: 1036 Facilitators: Alin Chavira-McCall, LRT,CTRS Location: [address] Dayroom   Goal Area(s) Addresses:  Patient will define components of whole wellness. Patient will verbalize benefit of whole wellness.   Group Description: Exercise. LRT and patients discussed the importance of physical activity and its affect on the body.  LRT and patients then completed as series of exercises. Patients took turns leading the group in the exercises of their choosing. Patients were to get water or take breaks as needed.    Affect/Mood: Appropriate   Participation Level: Engaged   Participation Quality: Independent   Behavior: Appropriate   Speech/Thought Process: Focused   Insight: Good   Judgement: Good   Modes of Intervention: Music and Exercise   Patient Response to Interventions:  Engaged   Education Outcome:  Acknowledges education   Clinical Observations/Individualized Feedback: Pt was bright and engaged in activity. Pt completed each exercise presented by peers as well as lead a few exercises of her own. Pt was engaged and appeared to be in a good mood.     Plan: Continue to engage patient in RT group sessions 2-3x/week.   Tariq Pernell-McCall, LRT,CTRS  11/24/2022 1:35 PM

## 2022-11-24 NOTE — Group Note (Signed)
Date:  11/24/2022 Time:  9:37 AM  Group Topic/Focus:  Goals Group:   The focus of this group is to help patients establish daily goals to achieve during treatment and discuss how the patient can incorporate goal setting into their daily lives to aide in recovery.    Participation Level:  Active  Participation Quality:  Attentive  Affect:  Appropriate  Cognitive:  Appropriate  Insight: Appropriate  Engagement in Group:  Engaged  Modes of Intervention:  Discussion  Additional Comments:  Pt goal: To talk to the Dr. so I can leave today."  Donell Beers 11/24/2022, 9:37 AM
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
## 2022-11-24 NOTE — Group Note (Signed)
Date:  11/24/2022 Time:  8:28 PM  Group Topic/Focus:  Wrap-Up Group:   The focus of this group is to help patients review their daily goal of treatment and discuss progress on daily workbooks.    Participation Level:  Did Not Attend   Scot Dock 11/24/2022, 8:28 PM

## 2022-11-24 NOTE — Progress Notes (Signed)
   11/24/22 2000  Psych Admission Type (Psych Patients Only)  Admission Status Involuntary  Psychosocial Assessment  Patient Complaints Anxiety  Eye Contact Fair  Facial Expression Animated;Anxious  Affect Appropriate to circumstance  Speech Logical/coherent  Interaction Assertive  Motor Activity Slow  Appearance/Hygiene Unremarkable  Behavior Characteristics Cooperative;Anxious  Aggressive Behavior  Effect No apparent injury  Thought Process  Coherency Circumstantial  Content Preoccupation;Blaming others  Delusions None reported or observed  Perception WDL  Hallucination None reported or observed  Judgment Impaired  Confusion None  Danger to Self  Current suicidal ideation? Denies  Danger to Others  Danger to Others None reported or observed

## 2022-11-24 NOTE — Progress Notes (Signed)
   11/24/22 0900  Psych Admission Type (Psych Patients Only)  Admission Status Involuntary  Psychosocial Assessment  Patient Complaints Anxiety  Eye Contact Fair  Facial Expression Animated  Affect Appropriate to circumstance  Speech Logical/coherent  Interaction Assertive  Motor Activity Slow  Appearance/Hygiene Unremarkable  Behavior Characteristics Cooperative  Mood Anxious  Thought Process  Coherency Circumstantial  Content Preoccupation  Delusions None reported or observed  Perception WDL  Hallucination None reported or observed  Judgment Poor  Confusion None  Danger to Self  Current suicidal ideation? Denies  Danger to Others  Danger to Others None reported or observed

## 2022-11-25 DIAGNOSIS — F259 Schizoaffective disorder, unspecified: Secondary | ICD-10-CM | POA: Diagnosis not present

## 2022-11-25 DIAGNOSIS — N390 Urinary tract infection, site not specified: Secondary | ICD-10-CM | POA: Insufficient documentation

## 2022-11-25 LAB — URINALYSIS, ROUTINE W REFLEX MICROSCOPIC
Bilirubin Urine: NEGATIVE
Glucose, UA: NEGATIVE mg/dL
Hgb urine dipstick: NEGATIVE
Ketones, ur: NEGATIVE mg/dL
Leukocytes,Ua: NEGATIVE
Nitrite: NEGATIVE
Protein, ur: NEGATIVE mg/dL
Specific Gravity, Urine: 1.016 (ref 1.005–1.030)
pH: 7 (ref 5.0–8.0)

## 2022-11-25 MED ORDER — TEMAZEPAM 15 MG PO CAPS
15.0000 mg | ORAL_CAPSULE | Freq: Every day | ORAL | Status: DC
  Administered 2022-11-25: 15 mg via ORAL
  Filled 2022-11-25: qty 1

## 2022-11-25 MED ORDER — WHITE PETROLATUM EX OINT
TOPICAL_OINTMENT | CUTANEOUS | Status: AC
  Administered 2022-11-25: 1
  Filled 2022-11-25: qty 5

## 2022-11-25 MED ORDER — NITROFURANTOIN MONOHYD MACRO 100 MG PO CAPS
100.0000 mg | ORAL_CAPSULE | Freq: Two times a day (BID) | ORAL | Status: DC
  Administered 2022-11-25 – 2022-11-26 (×2): 100 mg via ORAL
  Filled 2022-11-25 (×4): qty 1

## 2022-11-25 NOTE — Plan of Care (Signed)
  Problem: Health Behavior/Discharge Planning: Goal: Identification of resources available to assist in meeting health care needs will improve Outcome: Progressing   Problem: Medication: Goal: Compliance with prescribed medication regimen will improve Outcome: Progressing   Problem: Coping: Goal: Coping ability will improve Outcome: Progressing   Problem: Role Relationship: Goal: Will demonstrate positive changes in social behaviors and relationships Outcome: Progressing

## 2022-11-25 NOTE — Progress Notes (Signed)
   11/25/22 0800  Psych Admission Type (Psych Patients Only)  Admission Status Involuntary  Psychosocial Assessment  Patient Complaints None  Eye Contact Fair  Facial Expression Animated  Affect Appropriate to circumstance  Speech Logical/coherent  Interaction Assertive  Motor Activity Slow  Appearance/Hygiene Unremarkable  Behavior Characteristics Cooperative;Appropriate to situation  Mood Pleasant  Thought Process  Coherency Circumstantial  Content Preoccupation  Delusions None reported or observed  Perception WDL  Hallucination None reported or observed  Judgment Poor  Confusion None  Danger to Self  Current suicidal ideation? Denies  Agreement Not to Harm Self Yes  Description of Agreement Verbal  Danger to Others  Danger to Others None reported or observed

## 2022-11-25 NOTE — Progress Notes (Signed)
Patient is complaining of difficulty urinating ("I'm straining to much."), discharge, foul odor, burning and itching. Provider notified. Patient remains safe at this time.

## 2022-11-25 NOTE — Group Note (Signed)
Recreation Therapy Group Note   Group Topic:Self-Esteem  Group Date: 11/25/2022 Start Time: 1050 End Time: 1130 Facilitators: Douglas Rooks-McCall, LRT,CTRS Location: [address] Dayroom   Goal Area(s) Addresses:  Patient will identify and write at least one positive trait about themself. Patient will acknowledge the benefit of healthy self-esteem. Patient will endorse understanding of ways to increase self-esteem.    Group Description:  LRT began group session with open dialogue asking the patients to define self-esteem and verbally identify positive qualities and traits people may possess. Patients were then instructed to design a personalized license plate, with words and drawings, representing at least 3 positive things about themselves. Pts were encouraged to include favorites, things they are proud of, what they enjoy doing, and dreams for their future. If a patient had a life motto or a meaningful phase that expressed their life values, pt's were asked to incorporate that into their design as well. Patients were given the opportunity to share their completed work with the group.   Affect/Mood: Appropriate   Participation Level: Engaged   Participation Quality: Independent   Behavior: Appropriate   Speech/Thought Process: Focused   Insight: Good   Judgement: Good   Modes of Intervention: Art   Patient Response to Interventions:  Engaged   Education Outcome:  Acknowledges education   Clinical Observations/Individualized Feedback: Pt was bright and engaged during group session. Pt asked for clarification when needed and openly shared some of her thoughts. Pt described herself as being phenomenal, single and blessed. Pt highlighted loving herself, attending [employer], her love of R&B and gospel music and working on getting her GED this year. Pt also expressed wanting to become a nurse. Pt was appropriate and focused during group session.    Plan: Continue to engage patient in  RT group sessions 2-3x/week.   Rosalynn Sergent-McCall, LRT,CTRS 11/25/2022 2:44 PM

## 2022-11-25 NOTE — Progress Notes (Signed)
   11/25/22 2000  Psych Admission Type (Psych Patients Only)  Admission Status Involuntary  Psychosocial Assessment  Patient Complaints None  Eye Contact Fair  Facial Expression Animated;Anxious  Affect Appropriate to circumstance  Speech Logical/coherent  Interaction Assertive  Motor Activity Slow  Appearance/Hygiene Unremarkable  Behavior Characteristics Cooperative;Appropriate to situation  Mood Pleasant  Aggressive Behavior  Effect No apparent injury  Thought Process  Coherency Circumstantial  Content Preoccupation;Blaming others  Delusions None reported or observed  Perception WDL  Hallucination None reported or observed  Judgment Impaired  Confusion None  Danger to Self  Current suicidal ideation? Denies  Danger to Others  Danger to Others None reported or observed

## 2022-11-25 NOTE — Plan of Care (Signed)
Brief Psych Note: Patient was endorsing dysuria to nurse this morning. I went to speak to patient myself.  She endorses urinary urgency, burning with urination, and difficulty voiding. She also endorses thin, yellow, malodorous vaginal discharge that she has not noticed before. She denies any nocturia, urinary leakage, or stress (cough, laughter) incontinence.  She also endorsed difficulty defecating this morning and said it was painful. She endorses abdominal bloating and pain. Patient's abdomen is mildly distended and tender to deep palpation in all quadrants.  I ordered a urine culture and empiric antibiotics for UTI. Unit nurse informed to give patient PRN bowel regimen for constipation. Will monitor patient's symptoms.  Signed: Augusto Gamble, MD 11/25/2022 1:56 PM

## 2022-11-25 NOTE — Group Note (Signed)
Recreation Therapy Group Note   Group Topic:Animal Assisted Therapy   Group Date: 11/25/2022 Start Time: 0950 End Time: 1030 Facilitators: Tran Arzuaga-McCall, LRT,CTRS Location: [address] Dayroom   Animal-Assisted Activity (AAA) Program Checklist/Progress Notes Patient Eligibility Criteria Checklist & Daily Group note for Rec Tx Intervention  AAA/T Program Assumption of Risk Form signed by Patient/ or Parent Legal Guardian Yes  Patient is free of allergies or severe asthma Yes  Patient reports no fear of animals Yes  Patient reports no history of cruelty to animals Yes  Patient understands his/her participation is voluntary Yes  Patient washes hands before animal contact Yes  Patient washes hands after animal contact Yes   Affect/Mood: Appropriate   Participation Level: Engaged   Participation Quality: Independent   Behavior: Appropriate   Speech/Thought Process: Focused    Clinical Observations/Individualized Feedback:  Patient attended session and interacted appropriately with therapy dog and peers. Patient asked appropriate questions about therapy dog and his training. Patient shared stories about their pets at home with group.    Plan: Continue to engage patient in RT group sessions 2-3x/week.   Roth Ress-McCall, LRT,CTRS 11/25/2022 1:15 PM

## 2022-11-25 NOTE — Progress Notes (Signed)
Patient reports bowel movement. States that she does not need PRN medication at this time.

## 2022-11-25 NOTE — Progress Notes (Signed)
Adult Psychoeducational Group Note  Date:  11/25/2022 Time:  8:47 PM  Group Topic/Focus:  Wrap-Up Group:   The focus of this group is to help patients review their daily goal of treatment and discuss progress on daily workbooks.  Participation Level:  Active  Participation Quality:  Appropriate  Affect:  Appropriate  Cognitive:  Appropriate  Insight: Appropriate  Engagement in Group:  Engaged  Modes of Intervention:  Support  Additional Comments:  Pt attend group today. Pt stated that she is grateful to be here and she happy that he hospital helped her. Pt stated that she will use coping skills outside the hospital.   Satira Anis 11/25/2022, 8:47 PM
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
## 2022-11-25 NOTE — Group Note (Signed)
Date:  11/25/2022 Time:  10:36 AM  Group Topic/Focus:  Goals Group:   The focus of this group is to help patients establish daily goals to achieve during treatment and discuss how the patient can incorporate goal setting into their daily lives to aide in recovery. Orientation:   The focus of this group is to educate the patient on the purpose and policies of crisis stabilization and provide a format to answer questions about their admission.  The group details unit policies and expectations of patients while admitted.    Participation Level:  Active  Participation Quality:  Appropriate  Affect:  Appropriate  Cognitive:  Appropriate  Insight: Appropriate  Engagement in Group:  Engaged  Modes of Intervention:  Discussion  Additional Comments:   Patient attended morning orientation/gaol group and said that her goal for today is to find a place to stay and discuss her discharge plan.  Melissa Dougherty W [name] 11/25/2022, 10:36 AM

## 2022-11-26 ENCOUNTER — Other Ambulatory Visit (HOSPITAL_COMMUNITY): Payer: Self-pay

## 2022-11-26 DIAGNOSIS — F259 Schizoaffective disorder, unspecified: Secondary | ICD-10-CM | POA: Diagnosis not present

## 2022-11-26 MED ORDER — QUETIAPINE FUMARATE 300 MG PO TABS: 300.0000 mg | ORAL_TABLET | Freq: Every day | ORAL | 0 refills | Status: AC

## 2022-11-26 MED ORDER — HYDROXYZINE HCL 25 MG PO TABS: 25.0000 mg | ORAL_TABLET | Freq: Three times a day (TID) | ORAL | 0 refills | Status: AC | PRN

## 2022-11-26 MED ORDER — WHITE PETROLATUM EX OINT
TOPICAL_OINTMENT | CUTANEOUS | Status: AC
  Administered 2022-11-26: 1
  Filled 2022-11-26: qty 5

## 2022-11-26 MED ORDER — PRAZOSIN HCL 1 MG PO CAPS: 1.0000 mg | ORAL_CAPSULE | Freq: Every day | ORAL | 0 refills | Status: AC

## 2022-11-26 MED ORDER — TEMAZEPAM 15 MG PO CAPS: 15.0000 mg | ORAL_CAPSULE | Freq: Every day | ORAL | 0 refills | Status: AC

## 2022-11-26 MED ORDER — RISPERIDONE 1 MG PO TABS: 1.0000 mg | ORAL_TABLET | Freq: Every day | ORAL | 0 refills | Status: DC

## 2022-11-26 MED ORDER — DIVALPROEX SODIUM 500 MG PO DR TAB: 500.0000 mg | DELAYED_RELEASE_TABLET | Freq: Two times a day (BID) | ORAL | 0 refills | Status: AC

## 2022-11-26 MED ORDER — PROPRANOLOL HCL 10 MG PO TABS: 10.0000 mg | ORAL_TABLET | Freq: Two times a day (BID) | ORAL | 0 refills | Status: AC

## 2022-11-26 MED ORDER — PALIPERIDONE PALMITATE ER 156 MG/ML IM SUSY: 156.0000 mg | PREFILLED_SYRINGE | INTRAMUSCULAR | 0 refills | Status: AC

## 2022-11-26 MED ORDER — PANTOPRAZOLE SODIUM 20 MG PO TBEC: 20.0000 mg | DELAYED_RELEASE_TABLET | Freq: Every day | ORAL | 0 refills | Status: AC

## 2022-11-26 MED ORDER — NITROFURANTOIN MONOHYD MACRO 100 MG PO CAPS: 100.0000 mg | ORAL_CAPSULE | Freq: Two times a day (BID) | ORAL | 0 refills | Status: AC

## 2022-11-26 MED ORDER — RISPERIDONE 1 MG PO TABS
1.0000 mg | ORAL_TABLET | Freq: Every day | ORAL | Status: DC
  Filled 2022-11-26: qty 1

## 2022-11-26 NOTE — Progress Notes (Signed)
D: Pt A & O X 3. Denies SI, HI, AVH and pain at this time. D/C as ordered. Picked up in front of facility by taxi cab.  A: D/C instructions reviewed with pt including prescriptions and follow up appointments; compliance encouraged. All belongings from locker 35 returned to pt at time of departure. Scheduled medications given with verbal education and effects monitored. Safety checks maintained without incident till time of d/c.  R: Pt receptive to care. Compliant with medications when offered. Denies adverse drug reactions when assessed. Verbalized understanding related to d/c instructions. Signed belonging sheet in agreement with items received from locker. Ambulatory with a steady gait. Appears to be in no physical distress at time of departure.
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
## 2022-11-26 NOTE — BHH Group Notes (Signed)
The focus of this group is to help patients establish daily goals to achieve during treatment and discuss how the patient can incorporate goal setting into their daily lives to aide in recovery.     Scale 1-10: 10 out of 10      Goal for the day: Have a good day

## 2022-11-26 NOTE — TOC Benefit Eligibility Note (Signed)
Pharmacy Patient Advocate Encounter  Insurance verification completed.    The patient is insured through [facility] Medicare Part D   Ran test claim for temazepam (Restoril) 15 mg capsules and the current 30 day co-pay is $0.00.   This test claim was processed through [facility]- copay amounts may vary at other pharmacies due to pharmacy/plan contracts, or as the patient moves through the different stages of their insurance plan.    Roland Earl, CPHT Pharmacy Patient Advocate Specialist [facility] Patient Advocate Team Direct Number: [phone]  Fax: [phone]

## 2022-11-26 NOTE — Care Management Important Message (Signed)
Medicare IM given to patient by Social Work.
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
## 2022-12-05 ENCOUNTER — Ambulatory Visit: Payer: Medicare HMO

## 2022-12-09 ENCOUNTER — Ambulatory Visit
Admission: RE | Admit: 2022-12-09 | Discharge: 2022-12-09 | Disposition: A | Discharge status: Home / Self Care | Payer: Medicare HMO | Source: Ambulatory Visit | Attending: Cardiology | Admitting: Cardiology

## 2022-12-09 ENCOUNTER — Ambulatory Visit: Payer: Medicare HMO

## 2022-12-09 DIAGNOSIS — Z0000 Encounter for general adult medical examination without abnormal findings: Secondary | ICD-10-CM

## 2022-12-16 LAB — AMB RESULTS CONSOLE CBG: Glucose: 114

## 2022-12-16 NOTE — Progress Notes (Signed)
Pt address updated in chart from [address] to [address]  Pt has same SDOH needs as on 12/12/22-received resource handouts

## 2022-12-19 ENCOUNTER — Ambulatory Visit: Payer: Medicare HMO | Admitting: Podiatry

## 2022-12-25 ENCOUNTER — Ambulatory Visit (INDEPENDENT_AMBULATORY_CARE_PROVIDER_SITE_OTHER): Payer: Medicare HMO | Admitting: Podiatry

## 2022-12-25 DIAGNOSIS — Z91199 Patient's noncompliance with other medical treatment and regimen due to unspecified reason: Secondary | ICD-10-CM

## 2022-12-25 NOTE — Progress Notes (Signed)
[facility] [place]

## 2022-12-31 ENCOUNTER — Encounter: Payer: Self-pay | Admitting: *Deleted

## 2022-12-31 NOTE — Progress Notes (Signed)
Pt attended 12/16/22 screening event where her b/p was 108/84 and her blood sugar was 114. At the event, the pt identified food, housing, utility, and transportation insecurities and was given resources for all of them. The event staff documented a past PCP on her event screening form but the pt stated during the event f/u call that she is now seeing Hillery Aldo at [facility] on [address], whose documentation does not appear in [facility]. Pt stated she had seen NP [place] earlier this year but would be making a future appt soon to follow up on some current hearing issues. Chart review indicates pt has extensive [facility] care contacts and support  resources and she has future appt in August, 2024 with Gastroenterology and podiatry. Pt stated she had no ongoing food or other SDOH needs at present. No additional health equity team support indicated at this time.

## 2023-01-08 ENCOUNTER — Ambulatory Visit (INDEPENDENT_AMBULATORY_CARE_PROVIDER_SITE_OTHER): Payer: Medicare HMO | Admitting: Podiatry

## 2023-01-08 DIAGNOSIS — Z91199 Patient's noncompliance with other medical treatment and regimen due to unspecified reason: Secondary | ICD-10-CM

## 2023-01-08 NOTE — Progress Notes (Signed)
NS  NC 

## 2023-01-13 ENCOUNTER — Ambulatory Visit: Payer: Medicare HMO | Admitting: Gastroenterology

## 2024-02-01 ENCOUNTER — Emergency Department (HOSPITAL_COMMUNITY)
Admission: EM | Admit: 2024-02-01 | Discharge: 2024-02-02 | Disposition: A | Discharge status: Other Acute Inpatient Hospital | Attending: Emergency Medicine | Admitting: Emergency Medicine

## 2024-02-01 ENCOUNTER — Other Ambulatory Visit: Payer: Self-pay

## 2024-02-01 ENCOUNTER — Encounter (HOSPITAL_COMMUNITY): Payer: Self-pay

## 2024-02-01 DIAGNOSIS — F411 Generalized anxiety disorder: Secondary | ICD-10-CM | POA: Diagnosis not present

## 2024-02-01 DIAGNOSIS — T43206A Underdosing of unspecified antidepressants, initial encounter: Secondary | ICD-10-CM | POA: Insufficient documentation

## 2024-02-01 DIAGNOSIS — F319 Bipolar disorder, unspecified: Secondary | ICD-10-CM | POA: Diagnosis present

## 2024-02-01 DIAGNOSIS — Z79899 Other long term (current) drug therapy: Secondary | ICD-10-CM | POA: Diagnosis not present

## 2024-02-01 DIAGNOSIS — I10 Essential (primary) hypertension: Secondary | ICD-10-CM | POA: Insufficient documentation

## 2024-02-01 DIAGNOSIS — F3160 Bipolar disorder, current episode mixed, unspecified: Secondary | ICD-10-CM

## 2024-02-01 DIAGNOSIS — Z91128 Patient's intentional underdosing of medication regimen for other reason: Secondary | ICD-10-CM | POA: Insufficient documentation

## 2024-02-01 DIAGNOSIS — R451 Restlessness and agitation: Secondary | ICD-10-CM | POA: Diagnosis present

## 2024-02-01 DIAGNOSIS — F430 Acute stress reaction: Secondary | ICD-10-CM | POA: Insufficient documentation

## 2024-02-01 DIAGNOSIS — F4325 Adjustment disorder with mixed disturbance of emotions and conduct: Secondary | ICD-10-CM | POA: Insufficient documentation

## 2024-02-01 DIAGNOSIS — R440 Auditory hallucinations: Secondary | ICD-10-CM | POA: Diagnosis present

## 2024-02-01 LAB — CBC
HCT: 41.1 % (ref 36.0–46.0)
Hemoglobin: 12.3 g/dL (ref 12.0–15.0)
MCH: 20 pg — ABNORMAL LOW (ref 26.0–34.0)
MCHC: 29.9 g/dL — ABNORMAL LOW (ref 30.0–36.0)
MCV: 66.8 fL — ABNORMAL LOW (ref 80.0–100.0)
Platelets: 178 K/uL (ref 150–400)
RBC: 6.15 MIL/uL — ABNORMAL HIGH (ref 3.87–5.11)
RDW: 18.6 % — ABNORMAL HIGH (ref 11.5–15.5)
WBC: 7.4 K/uL (ref 4.0–10.5)
nRBC: 0 % (ref 0.0–0.2)

## 2024-02-01 LAB — COMPREHENSIVE METABOLIC PANEL WITH GFR
ALT: 10 U/L (ref 0–44)
AST: 16 U/L (ref 15–41)
Albumin: 4.2 g/dL (ref 3.5–5.0)
Alkaline Phosphatase: 122 U/L (ref 38–126)
Anion gap: 14 (ref 5–15)
BUN: 15 mg/dL (ref 6–20)
CO2: 23 mmol/L (ref 22–32)
Calcium: 9.9 mg/dL (ref 8.9–10.3)
Chloride: 102 mmol/L (ref 98–111)
Creatinine, Ser: 0.66 mg/dL (ref 0.44–1.00)
GFR, Estimated: >60 mL/min (ref >60)
Glucose, Bld: 101 mg/dL — ABNORMAL HIGH (ref 70–99)
Potassium: 3.7 mmol/L (ref 3.5–5.1)
Sodium: 139 mmol/L (ref 135–145)
Total Bilirubin: 0.7 mg/dL (ref 0.0–1.2)
Total Protein: 8.1 g/dL (ref 6.5–8.1)

## 2024-02-01 LAB — URINE DRUG SCREEN
Amphetamines: NEGATIVE
Barbiturates: NEGATIVE
Benzodiazepines: NEGATIVE
Cocaine: NEGATIVE
Fentanyl: NEGATIVE
Methadone Scn, Ur: NEGATIVE
Opiates: NEGATIVE
Tetrahydrocannabinol: NEGATIVE

## 2024-02-01 LAB — ETHANOL: Alcohol, Ethyl (B): <15 mg/dL (ref <15)

## 2024-02-01 MED ORDER — RISPERIDONE 1 MG PO TABS
1.0000 mg | ORAL_TABLET | Freq: Two times a day (BID) | ORAL | Status: DC
  Administered 2024-02-01: 1 mg via ORAL
  Filled 2024-02-01 (×2): qty 1

## 2024-02-01 MED ORDER — HYDROXYZINE HCL 25 MG PO TABS
25.0000 mg | ORAL_TABLET | Freq: Three times a day (TID) | ORAL | Status: DC | PRN
  Administered 2024-02-01: 25 mg via ORAL
  Filled 2024-02-01: qty 1

## 2024-02-01 NOTE — ED Triage Notes (Signed)
 Pt wants a psych evaluation because she does not want to hurt her family anymore. She wants to go to jail. Pt denies wanting to hurt herself. She just wants to go to jail so she does not hurt anyone.

## 2024-02-01 NOTE — ED Provider Triage Note (Signed)
 Emergency Medicine Provider Triage Evaluation Note  Melissa Dougherty , a 53 y.o. female  was evaluated in triage.  Pt complains of intermittent suicidal ideation and reportedly being off of psychiatric medications.  Review of Systems  Positive: Reports inappropriate activities around family, reports family is tired of her actions, reports that sometimes she wants to hurt herself Negative: Shortness of breath, chest pain, injury, distress  Physical Exam  BP (!) 128/93 (BP Location: Left Arm)   Pulse 89   Temp 98.8 F (37.1 C)   Resp 16   SpO2 100%  Gen:   Awake, no distress   Resp:  Normal effort satting above 100% on room air MSK:   Moves extremities without difficulty  Other:  Reports she has been off her medication for a while and has thoughts of hurting herself at times  Medical Decision Making  Medically screening exam initiated at 2:42 PM.  Appropriate orders placed.  Melissa Dougherty was informed that the remainder of the evaluation will be completed by another provider, this initial triage assessment does not replace that evaluation, and the importance of remaining in the ED until their evaluation is complete.  Patient reports she is tired of hurting her family and she is not been on the medication, she has self discontinued it.  Patient reports she has thoughts of hurting herself at times.  No active plan of hurting herself and she denies wanting to hurt anyone.   Myriam Fonda RAMAN, [place] 02/01/24 1446

## 2024-02-01 NOTE — Progress Notes (Signed)
 Inpatient Psychiatric Referral  Patient was recommended inpatient per Jadeka Motley-Mangrum, NP . There are no available beds at [facility], per [facility]. Patient was referred to the following out of network facilities:  Destination  Service Provider Request Status Address Phone Fax  [facility]  Pending - Request Sent [address] [place] [address] [phone]  [facility]  Pending - Request Sent [address], [place] [address] [phone]  [facility]  Pending - Request Sent [address] [place] [address] [phone]  [facility]  Pending - Request Sent [address] [place] [address] [phone]  [facility]  Pending - Request Sent [address] [place] [address] [phone]  [facility]  Pending - Request Sent [address], [place] [address] [phone]  [facility]  Pending - Request Sent [address] [place] [phone]  [facility]  Pending - Request Sent [address], [place] [address] [phone]  [facility]  Pending - Request Sent [address] [place] [address] [phone]     Situation ongoing, CSW to continue following and update chart as more information becomes available.   Harrie Sofia MSW, LCSWA 02/01/2024  7:18 PM

## 2024-02-01 NOTE — Progress Notes (Signed)
 Pt has been accepted to [facility] on 02/01/2024 . Bed assignment:[place] A   Pt meets inpatient criteria per Cathaleen Adam, NP   Attending Physician will be Dr. Jess   Report can be called to: - [phone]   Pt can arrive after 8AM   Care Team Notified: Ozell Keel, RN, Cathaleen Adam, NP

## 2024-02-01 NOTE — ED Provider Notes (Signed)
 [place] EMERGENCY DEPARTMENT AT [facility] Provider Note   CSN: [id number] Arrival date & time: 02/01/24  1210     Patient presents with: Psychiatric Evaluation   Melissa Dougherty is a 53 y.o. female.   HPI 53 year old female presents with concern for a mental health crisis.  She has a history of bipolar disorder though states has been many years since she has been on meds.  It is hard to follow the exact timeline of her symptoms but she states that she is restless and is concerned about wanting to harm her family.  She denies any suicidal thoughts or intents.  She denies any pain such as headache, chest pain, etc.  She does feel like she hears voices.  She feels very restless.  She complains of a chronic dry mouth.  Prior to Admission medications   Medication Sig Start Date End Date Taking? Authorizing Provider  chlorhexidine  (PERIDEX ) 0.12 % solution Use as directed 15 mLs in the mouth or throat 2 (two) times daily. 11/20/22   Neldon Hamp RAMAN, PA  divalproex  (DEPAKOTE ) 500 MG DR tablet Take 1 tablet (500 mg total) by mouth every 12 (twelve) hours. 11/26/22 12/26/22  Massengill, Rankin, MD  hydrOXYzine  (ATARAX ) 25 MG tablet Take 1 tablet (25 mg total) by mouth 3 (three) times daily as needed for itching or anxiety. 11/26/22   Massengill, Rankin, MD  paliperidone  (INVEGA  SUSTENNA) 156 MG/ML SUSY injection Inject 1 mL (156 mg total) into the muscle every 28 (twenty-eight) days. Next dose is due on 12-09-22. 12/09/22   Johny Rankin, MD  pantoprazole  (PROTONIX ) 20 MG tablet Take 1 tablet (20 mg total) by mouth daily. 11/26/22 12/26/22  Massengill, Rankin, MD  prazosin  (MINIPRESS ) 1 MG capsule Take 1 capsule (1 mg total) by mouth at bedtime. 11/26/22 12/26/22  Massengill, Rankin, MD  propranolol  (INDERAL ) 10 MG tablet Take 1 tablet (10 mg total) by mouth every 12 (twelve) hours. 11/26/22 12/26/22  Massengill, Rankin, MD  QUEtiapine  (SEROQUEL ) 300 MG tablet Take 1 tablet (300 mg total) by mouth  at bedtime. 11/26/22 12/26/22  Massengill, Rankin, MD  risperiDONE  (RISPERDAL ) 1 MG tablet Take 1 tablet (1 mg total) by mouth at bedtime. 11/26/22 12/26/22  Massengill, Rankin, MD  temazepam  (RESTORIL ) 15 MG capsule Take 1 capsule (15 mg total) by mouth at bedtime for 7 days. 11/26/22 12/03/22  Massengill, Rankin, MD  benztropine  (COGENTIN ) 0.5 MG tablet Take 0.5 mg by mouth at bedtime. 01/17/19 05/31/19  [provider]  omeprazole (PRILOSEC) 20 MG capsule Take 20 mg by mouth 2 (two) times daily before a meal.  03/03/19 05/31/19  [provider]  rizatriptan  (MAXALT ) 10 MG tablet Take 1 tablet by mouth at the onset of headache. May repeat in 2 hours if needed Patient not taking: Reported on 01/31/2019 01/02/16 05/31/19  Calone, Gregory D, FNP    Allergies: Trazodone  and nefazodone    Review of Systems  Cardiovascular:  Negative for chest pain.  Neurological:  Negative for headaches.  Psychiatric/Behavioral:  Positive for hallucinations. Negative for suicidal ideas.     Updated Vital Signs BP (!) 128/93 (BP Location: Left Arm)   Pulse 89   Temp 98.8 F (37.1 C)   Resp 16   SpO2 100%   Physical Exam Vitals and nursing note reviewed.  Constitutional:      Appearance: She is well-developed.  HENT:     Head: Normocephalic and atraumatic.  Cardiovascular:     Rate and Rhythm: Normal rate and  regular rhythm.     Heart sounds: Normal heart sounds.  Pulmonary:     Effort: Pulmonary effort is normal.  Abdominal:     General: There is no distension.     Palpations: Abdomen is soft.  Skin:    General: Skin is warm and dry.  Neurological:     Mental Status: She is alert.  Psychiatric:        Thought Content: Thought content does not include suicidal ideation.     Comments: Patient is standing and intermittently pacing.  Does not appear angry or agitated but feels a little restless.     (all labs ordered are listed, but only abnormal results are displayed) Labs Reviewed   COMPREHENSIVE METABOLIC PANEL WITH GFR - Abnormal; Notable for the following components:      Result Value   Glucose, Bld 101 (*)    All other components within normal limits  CBC - Abnormal; Notable for the following components:   RBC 6.15 (*)    MCV 66.8 (*)    MCH 20.0 (*)    MCHC 29.9 (*)    RDW 18.6 (*)    All other components within normal limits  ETHANOL  URINE DRUG SCREEN    EKG: None  Radiology: No results found.   Procedures   Medications Ordered in the ED  hydrOXYzine  (ATARAX ) tablet 25 mg (has no administration in time range)  risperiDONE  (RISPERDAL ) tablet 1 mg (has no administration in time range)                                    Medical Decision Making Amount and/or Complexity of Data Reviewed External Data Reviewed: notes. Labs: ordered.    Details: Screening labs show no elevated WBC or significant electrolyte disturbance   Medical screening labs are unremarkable.  Vital signs are overall unremarkable besides a mildly elevated blood pressure.  I think she is medically stable for psychiatric consultation and disposition.  Patient was seen by psychiatry and they would like for her to be admitted inpatient and they have involuntarily committed her.     Final diagnoses:  Bipolar affective disorder, remission status unspecified [facility])    ED Discharge Orders     None          Freddi [place], MD 02/01/24 TYRA
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
## 2024-02-02 NOTE — ED Provider Notes (Addendum)
 Emergency Medicine Observation Re-evaluation Note  Melissa Dougherty is a 53 y.o. female, seen on rounds today.  Pt initially presented to the ED for complaints of Psychiatric Evaluation Currently, the patient is history of bipolar disorder presents with paranoia and auditory hallucinations.  Physical Exam  BP (!) 156/72 (BP Location: Left Arm)   Pulse (!) 111   Temp 97.6 F (36.4 C) (Axillary)   Resp 20   SpO2 100%  Physical Exam General: No acute distress Cardiac: Mild tachycardia Lungs: Normal respiratory rate and oxygenation Psych: Paranoia and auditory hallucinations  ED Course / MDM  EKG:   I have reviewed the labs performed to date as well as medications administered while in observation.  Recent changes in the last 24 hours include patient seen and evaluated by psychiatry. Labs reviewed normal hemoglobin, white count, platelets.  Normal electrolytes and CMET is normal.  Urine drug screen clear.  Patient medically cleared for psychiatric evaluation  Plan  Current plan is for patient is to be admitted to [facility] with Dr. Jess as excepting. IVC is in place EMTALA completed Patient reevaluated just prior to discharge.  Nursing notes note that patient's heart rate is 129.  On my check heart rate is 90.  Patient has slightly elevated blood pressure normal temp, respiratory rate, oxygen saturations. Patient is cleared for transfer to behavioral [facility] Levander Houston, MD 02/02/24 [id number]    Levander Houston, MD 02/02/24 [id number]    Levander Houston, MD 02/02/24 [phone]

## 2024-02-02 NOTE — Progress Notes (Signed)
 02/02/2024  0730  Called sheriff [phone] to transport patient to [facility].

## 2024-05-29 ENCOUNTER — Encounter (HOSPITAL_COMMUNITY): Payer: Self-pay | Admitting: Emergency Medicine

## 2024-05-29 ENCOUNTER — Ambulatory Visit (HOSPITAL_COMMUNITY)
Admission: EM | Admit: 2024-05-29 | Discharge: 2024-05-29 | Disposition: A | Discharge status: Home / Self Care | Attending: Emergency Medicine | Admitting: Emergency Medicine

## 2024-05-29 DIAGNOSIS — R350 Frequency of micturition: Secondary | ICD-10-CM | POA: Diagnosis not present

## 2024-05-29 DIAGNOSIS — M545 Low back pain, unspecified: Secondary | ICD-10-CM | POA: Diagnosis not present

## 2024-05-29 LAB — POCT URINE DIPSTICK
Bilirubin, UA: NEGATIVE
Blood, UA: NEGATIVE
Glucose, UA: NEGATIVE mg/dL
Ketones, POC UA: NEGATIVE mg/dL
Leukocytes, UA: NEGATIVE
Nitrite, UA: NEGATIVE
POC PROTEIN,UA: NEGATIVE
Spec Grav, UA: 1.01
Urobilinogen, UA: 0.2 U/dL
pH, UA: 6

## 2024-05-29 LAB — GLUCOSE, POCT (MANUAL RESULT ENTRY): POC Glucose: 97 mg/dL (ref 70–99)

## 2024-05-29 MED ORDER — IBUPROFEN 800 MG PO TABS: 800.0000 mg | ORAL_TABLET | Freq: Three times a day (TID) | ORAL | 0 refills | Status: AC

## 2024-05-29 NOTE — Discharge Instructions (Addendum)
 Your urine did not show evidence of infection.  Continue to drink at least 64 ounces of water  daily to help flush your kidneys and avoid urinary irritants such as juice,soda and caffeine.  For the low back pain you can take ibuprofen  as needed.  I suggest gentle stretching, heat and warm compresses.  If the back pain persist follow-up with an orthopedic.  For concerns of your sleeping and behavioral health medications follow-up with your psychiatrist.  If this is not available at the behavioral health urgent care is open 24/7 for behavioral health needs.
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
## 2024-05-29 NOTE — ED Triage Notes (Addendum)
 Patient has had back pain, urine frequency, and her mouth has been dry last week.  Patient has been having trouble sleeping. Patient has not taken any medication

## 2024-05-30 ENCOUNTER — Ambulatory Visit (HOSPITAL_COMMUNITY)
Admission: EM | Admit: 2024-05-30 | Discharge: 2024-05-30 | Disposition: A | Discharge status: Home / Self Care | Source: Home / Self Care

## 2024-05-30 ENCOUNTER — Emergency Department (HOSPITAL_COMMUNITY)
Admission: EM | Admit: 2024-05-30 | Discharge: 2024-05-30 | Disposition: A | Discharge status: Home / Self Care | Attending: Emergency Medicine | Admitting: Emergency Medicine

## 2024-05-30 ENCOUNTER — Encounter (HOSPITAL_COMMUNITY): Payer: Self-pay

## 2024-05-30 ENCOUNTER — Emergency Department (HOSPITAL_COMMUNITY)

## 2024-05-30 DIAGNOSIS — M545 Low back pain, unspecified: Secondary | ICD-10-CM | POA: Diagnosis present

## 2024-05-30 DIAGNOSIS — F314 Bipolar disorder, current episode depressed, severe, without psychotic features: Secondary | ICD-10-CM | POA: Insufficient documentation

## 2024-05-30 DIAGNOSIS — R682 Dry mouth, unspecified: Secondary | ICD-10-CM | POA: Insufficient documentation

## 2024-05-30 DIAGNOSIS — Z5919 Other inadequate housing: Secondary | ICD-10-CM | POA: Diagnosis not present

## 2024-05-30 DIAGNOSIS — Z658 Other specified problems related to psychosocial circumstances: Secondary | ICD-10-CM | POA: Diagnosis not present

## 2024-05-30 DIAGNOSIS — R350 Frequency of micturition: Secondary | ICD-10-CM | POA: Diagnosis not present

## 2024-05-30 LAB — URINALYSIS, ROUTINE W REFLEX MICROSCOPIC
Bilirubin Urine: NEGATIVE
Glucose, UA: NEGATIVE mg/dL
Hgb urine dipstick: NEGATIVE
Ketones, ur: NEGATIVE mg/dL
Leukocytes,Ua: NEGATIVE
Nitrite: NEGATIVE
Protein, ur: NEGATIVE mg/dL
Specific Gravity, Urine: 1.003 — ABNORMAL LOW (ref 1.005–1.030)
pH: 6 (ref 5.0–8.0)

## 2024-05-30 MED ORDER — KETOROLAC TROMETHAMINE 60 MG/2ML IM SOLN
60.0000 mg | Freq: Once | INTRAMUSCULAR | Status: AC
  Administered 2024-05-30: 60 mg via INTRAMUSCULAR
  Filled 2024-05-30: qty 2

## 2024-05-30 MED ORDER — IBUPROFEN 400 MG PO TABS: 400.0000 mg | ORAL_TABLET | Freq: Once | ORAL | Status: DC

## 2024-05-30 MED ORDER — METHOCARBAMOL 500 MG PO TABS
500.0000 mg | ORAL_TABLET | Freq: Once | ORAL | Status: AC
  Administered 2024-05-30: 500 mg via ORAL
  Filled 2024-05-30: qty 1

## 2024-05-30 MED ORDER — RISPERIDONE 1 MG PO TABS: 1.0000 mg | ORAL_TABLET | Freq: Every day | ORAL | 0 refills | Status: AC

## 2024-05-30 NOTE — Discharge Instructions (Addendum)
 Patient is instructed prior to discharge to:  Take all medications as prescribed by his/her mental healthcare provider. Report any adverse effects and or reactions from the medicines to his/her outpatient provider promptly. Keep all scheduled appointments, to ensure that you are getting refills on time and to avoid any interruption in your medication.  If you are unable to keep an appointment call to reschedule.  Be sure to follow-up with resources and follow-up appointments provided.  Patient has been instructed & cautioned: To not engage in alcohol and or illegal drug use while on prescription medicines. In the event of worsening symptoms, patient is instructed to call the crisis hotline, 911 and or go to the nearest ED for appropriate evaluation and treatment of symptoms. To follow-up with his/her primary care provider for your other medical issues, concerns and or health care needs.  Information: -National Suicide Prevention Lifeline 1-800-SUICIDE or 530-156-3980.  -988 offers 24/7 access to trained crisis counselors who can help people experiencing mental health-related distress. People can call or text 988 or chat 988lifeline.org for themselves or if they are worried about a loved one who may need crisis support.       Based on the information that you have provided and the presenting issues outpatient Tailored Care Management through [facility] have been recommended.  It is imperative that you follow through with treatment recommendations within 5-7 days from the of discharge to mitigate further risk to your safety and mental well-being.  In  case of an urgent crisis, you may contact the Mobile Crisis Unit with [facility] at 1.484-437-9161.   What is Tailored Care Management (TCM)? Tailored Care Management is an important part [employer] [employer]. Tailored Care Management provides whole-person care from all health care providers. Whole person care brings together all  of a persons needs, including behavioral health, physical health, pharmacy, and unmet health-related resource needs. Tailored Care Management means better health outcomes for our members.  [facility] will help match you to a Care Manager that has specialized training to meet your needs. You may change your Care Manager twice a year for any reason and at any time with a good reason. You can choose not to have a Care Manager at any time by calling Member and Recipient Services at [phone] or completing the form in the Member Portal.  Member Choice in TCM You have a choice in where you receive Tailored Care Management:  Care Management Agencies [facility]): provider organizations with experience providing behavioral health, intellectual/developmental disability, and/or traumatic brain injury services to our population.  [facility] ([facility]): primary care practices whose providers have experience providing primary care services to our population. Here are some things to think about when you choose how you get Tailored Care Management: Where you live.  Your specific health care needs. The providers you are seeing now. How serious are your physical medical needs are.    ..  [facility] Address: [address], [place], [place] [address] Phone: [phone] Hours: M -F: 8:30am-5:30pm Description: [facility] is a Management Consultant that works with individuals and families at any stage of the continuum from homelessness to homeownership.   [facility] Address: [address] [place], [place], [place] [address] Phone: [phone] Hours: M -F: 8:30am-5:00pm Description: [facility] mission is to express the love of God to our neighbors in need by offering food, shelter and solutions.   [facility] Address: [address],  [place], [place] [address] Phone: [phone]  [facility] [place]  Address: [phone] [address] [place], [place] [address] Phone: [phone] Description: Emergency Rental Assistance Program (COVID-19 related); Foreclosure Clinic referrals   [facility] Address: [address] [place], [place] [address] Phone: [phone] Description: Voucher based housing program by [employer]; openings vary and typically has waitlists     Outpatient Services for Therapy and Medication Management for Medicaid  Based on what you have shared, a list of resources for outpatient therapy and psychiatry is provided below to get you started back on treatment.  It is imperative that you follow through with treatment within 5-7 days from the day of discharge to prevent any further risk to your safety or mental well-being.  You are not limited to the list provided.  In case of an urgent crisis, you may contact the Mobile Crisis Unit with [facility] at 1.631-330-1934.         [facility] [address] [place], [place], [address] [phone] phone (Medicaid, ask about other insurance)  [facility] [address] [place], [place], [address] [phone] phone [phone] fax ([address] [place] , [place], [place], [place] Health Choice, UHC, GENERAL ELECTRIC, Self-Pay)  Sarah Lempka [address] [place], [place], [address] [phone] phone ([address] [employer], [address], [address], [place], [employer], [place], [place], [place], [employer], [place], [place], [place], [facility])  [facility] - 6-8 MONTH WAIT FOR THERAPY; SOON[facility] FOR MEDICATION MANAGEMENT [address] [place], [place], [address] [phone] phone ([address], [employer] [address] [place], [address], [place], [place], [employer] Plans, [address], [employer]BS Healthy [place], [place], [address], [place], [place], [place], [place], T[employer]are, UHC, [employer],  [place])  [facility] [address] [place], [place], [address] [phone] phone  [facility] [address] [place], [place], [address] [phone] phone  [facility] [address] [place], [place], [address] [phone] phone  [facility] - THERAPY ONLY [address]  [place], [place], [address] [phone] phone  [facility] [place] [address] [place], [place], [address] [phone] phone  [facility] [address] [place], [place], [address] [phone] phone [phone] fax  [facility] [address] [place], [place], [address] [phone] phone [phone] fax  [facility] [phone] [place], [place], [address] [phone] phone  Janit Griffins [address] [place], [place], [address]  [phone] phone  [facility]  (Adults Only) [address] [place], [place], [address]  [phone] phone [phone] fax

## 2024-05-30 NOTE — ED Provider Triage Note (Signed)
 Emergency Medicine Provider Triage Evaluation Note  Melissa Dougherty , a 54 y.o. female  was evaluated in triage.  Pt complains of back pain, this been going on for couple of weeks, gradually worsening and feels like it is across her bilateral lower back, she also has some urinary frequency but no fevers chills nausea or vomiting, she has difficulty walking because when she stands up it hurts her back muscles but has no difficulty using her legs when she is in a seated position and denies numbness or weakness down the legs.  No prior lower back surgery  Review of Systems  Positive: Back pain Negative: Numbness weakness fevers chills nausea vomiting dysuria  Physical Exam  BP (!) 137/99   Pulse 93   Temp 98.1 F (36.7 C) (Oral)   Resp 18   SpO2 99%  Gen:   Awake, no distress   Resp:  Normal effort  MSK:   Moves extremities without difficulty infectious totally normal strength and sensation of the bilateral lower extremities entirely Other:  There is reproducible tenderness to palpation across the bilateral lower back and the muscles  Medical Decision Making  Medically screening exam initiated at 9:46 AM.  Appropriate orders placed.  WYNTER ISAACS was informed that the remainder of the evaluation will be completed by another provider, this initial triage assessment does not replace that evaluation, and the importance of remaining in the ED until their evaluation is complete.  X-ray, urinalysis, the patient does not appear clinically ill and has no focal findings or high red flag features   Cleotilde Rogue, MD 05/30/24 [phone]

## 2024-05-30 NOTE — Progress Notes (Signed)
" °   05/30/24 1414  [facility] Triage Screening (Walk-ins at [facility] only)  How Did You Hear About Us ? Family/Friend  What Is the Reason for Your Visit/Call Today? Melissa Dougherty is a 67Y female presenting to [facility] as a vol walk-in with her mother. Pt states she is here today because she is in need of medication and an Invega  shot. Pt has been without her medication since November of last year. Pt states she has an appointment later this month with [facility] but does not think it will help her. Pt states she has also been feeling depressed and unhappy. Pt denies SI, HI, AVH, and substance use. Pt has a hx of schizophrenia and bipolar. Pt is also having difficulty walking/standing pt states she was at [facility] today for a kidney infection and the pain has gotten worse since discharge.  How Long Has This Been Causing You Problems? <Week  Have You Recently Had Any Thoughts About Hurting Yourself? No  Are You Planning to Commit Suicide/Harm Yourself At This time? No  Have you Recently Had Thoughts About Hurting Someone Sherral? No  Are You Planning To Harm Someone At This Time? No  Physical Abuse Denies  Verbal Abuse Denies  Sexual Abuse Denies  Are you currently experiencing any auditory, visual or other hallucinations? No  Have You Used Any Alcohol or Drugs in the Past 24 Hours? No  Do you have any current medical co-morbidities that require immediate attention? Yes  Please describe current medical co-morbidities that require immediate attention: Pt can not stand on walk due to kidney infection pain  Clinician description of patient physical appearance/behavior: tearful  What Do You Feel Would Help You the Most Today? Medication(s);Treatment for Depression or other mood problem  Determination of Need Routine (7 days)  Options For Referral [facility];Medication Management;Outpatient Therapy    "

## 2024-05-30 NOTE — ED Provider Notes (Signed)
 " [place] [facility] Provider Note   CSN: [id number] Arrival date & time: 05/30/24  9072     Patient presents with: No chief complaint on file.   Melissa Dougherty is a 54 y.o. female.  With pertinent medical history of bipolar disorder, depression, GERD, seizures, anxiety.   Patient is here for evaluation of diffuse low back pain for the last couple of weeks.  She reports the pain has been getting gradually worse.  She reports difficulty walking as when she stands her back muscles hurt.  Denies known injury, exertion, or heavy lifting associated with development of pain.  Denies numbness or weakness of legs, urinary or fecal incontinence, or saddle anesthesia.  She also reports dry mouth and increased urinary frequency.  She states she has been drinking more water  due to the dry mouth.  Denies fevers, chills, nausea, or vomiting.  She has no known history of diabetes though states this runs in her family.  Denies dysuria or frank hematuria.  States she has scheduled appointment to establish with [facility] mental health on January 23.  Patient was seen at local urgent care yesterday for same symptoms.  Urinalysis at that time showed no evidence of infection.  Patient was recommended to try ibuprofen  for her back pain.  She states that she was unable to get this medication yesterday evening.        Prior to Admission medications  Medication Sig Start Date End Date Taking? Authorizing Provider  chlorhexidine  (PERIDEX ) 0.12 % solution Use as directed 15 mLs in the mouth or throat 2 (two) times daily. 11/20/22   Neldon Hamp RAMAN, PA  divalproex  (DEPAKOTE ) 500 MG DR tablet Take 1 tablet (500 mg total) by mouth every 12 (twelve) hours. 11/26/22 12/26/22  Massengill, Rankin, MD  hydrOXYzine  (ATARAX ) 25 MG tablet Take 1 tablet (25 mg total) by mouth 3 (three) times daily as needed for itching or anxiety. 11/26/22   Massengill, Rankin, MD  ibuprofen  (ADVIL ) 800 MG tablet  Take 1 tablet (800 mg total) by mouth 3 (three) times daily. 05/29/24   Dreama, [place]  N, FNP  paliperidone  (INVEGA  SUSTENNA) 156 MG/ML SUSY injection Inject 1 mL (156 mg total) into the muscle every 28 (twenty-eight) days. Next dose is due on 12-09-22. 12/09/22   Johny Rankin, MD  pantoprazole  (PROTONIX ) 20 MG tablet Take 1 tablet (20 mg total) by mouth daily. 11/26/22 12/26/22  Massengill, Rankin, MD  prazosin  (MINIPRESS ) 1 MG capsule Take 1 capsule (1 mg total) by mouth at bedtime. 11/26/22 12/26/22  Massengill, Rankin, MD  propranolol  (INDERAL ) 10 MG tablet Take 1 tablet (10 mg total) by mouth every 12 (twelve) hours. 11/26/22 12/26/22  Massengill, Rankin, MD  QUEtiapine  (SEROQUEL ) 300 MG tablet Take 1 tablet (300 mg total) by mouth at bedtime. 11/26/22 12/26/22  Massengill, Rankin, MD  risperiDONE  (RISPERDAL ) 1 MG tablet Take 1 tablet (1 mg total) by mouth at bedtime. 11/26/22 12/26/22  Massengill, Rankin, MD  temazepam  (RESTORIL ) 15 MG capsule Take 1 capsule (15 mg total) by mouth at bedtime for 7 days. 11/26/22 12/03/22  Massengill, Rankin, MD  benztropine  (COGENTIN ) 0.5 MG tablet Take 0.5 mg by mouth at bedtime. 01/17/19 05/31/19  [provider]  omeprazole (PRILOSEC) 20 MG capsule Take 20 mg by mouth 2 (two) times daily before a meal.  03/03/19 05/31/19  [provider]  rizatriptan  (MAXALT ) 10 MG tablet Take 1 tablet by mouth at the onset of headache. May repeat in 2 hours  if needed Patient not taking: Reported on 01/31/2019 01/02/16 05/31/19  Calone, Gregory D, FNP    Allergies: Trazodone  and nefazodone    Review of Systems  Musculoskeletal:  Positive for back pain.    Updated Vital Signs BP (!) 137/99   Pulse 93   Temp 98.1 F (36.7 C) (Oral)   Resp 18   SpO2 99%   Physical Exam Vitals and nursing note reviewed.  Constitutional:      General: She is not in acute distress.    Appearance: Normal appearance. She is normal weight. She is not ill-appearing, toxic-appearing or diaphoretic.   HENT:     Head: Normocephalic and atraumatic.     Nose: Nose normal.     Mouth/Throat:     Mouth: Mucous membranes are moist.  Eyes:     General: No scleral icterus.    Extraocular Movements: Extraocular movements intact.     Conjunctiva/sclera: Conjunctivae normal.  Cardiovascular:     Rate and Rhythm: Normal rate and regular rhythm.     Pulses: Normal pulses.     Heart sounds: Normal heart sounds.  Pulmonary:     Effort: Pulmonary effort is normal. No respiratory distress.     Breath sounds: Normal breath sounds. No stridor. No wheezing, rhonchi or rales.  Abdominal:     General: Abdomen is flat. Bowel sounds are normal. There is no distension.     Palpations: Abdomen is soft.     Tenderness: There is no abdominal tenderness. There is no right CVA tenderness, left CVA tenderness or guarding.  Musculoskeletal:        General: Tenderness (Diffuse lumbar pain with no obvious injury or deformity.) present. No swelling or deformity. Normal range of motion.     Cervical back: Normal range of motion.     Right lower leg: No edema.     Left lower leg: No edema.     Comments: Equal strength of bilateral lower extremities and no change in back pain with movement/assessment of lower extremities.  Skin:    General: Skin is warm and dry.     Capillary Refill: Capillary refill takes less than 2 seconds.     Coloration: Skin is not jaundiced or pale.  Neurological:     Mental Status: She is alert and oriented to person, place, and time.     (all labs ordered are listed, but only abnormal results are displayed) Labs Reviewed  URINALYSIS, ROUTINE W REFLEX MICROSCOPIC - Abnormal; Notable for the following components:      Result Value   Color, Urine STRAW (*)    Specific Gravity, Urine 1.003 (*)    All other components within normal limits    EKG: None  Radiology: DG Lumbar Spine Complete Result Date: 05/30/2024 CLINICAL DATA:  Back pain EXAM: LUMBAR SPINE - COMPLETE 4+ VIEW  COMPARISON:  01/10/2022 FINDINGS: There is no evidence of lumbar spine fracture. Alignment is normal. Intervertebral disc spaces are maintained. Preserved vertebral body heights. Facets are aligned. No visualized pars defects. SI joints are maintained. Included pelvis and hips unremarkable. Postop changes in the left abdomen as before. Nonobstructive bowel gas pattern. IMPRESSION: No acute finding by plain radiography. Electronically Signed   By: CHRISTELLA.  Shick M.D.   On: 05/30/2024 11:22     Procedures   Medications Ordered in the ED  ketorolac  (TORADOL ) injection 60 mg (60 mg Intramuscular Given 05/30/24 1051)  methocarbamol  (ROBAXIN ) tablet 500 mg (500 mg Oral Given 05/30/24 1051)    Patient  presents to the ED for concern of low back pain, increased urinary frequency and dry mouth, this involves an extensive number of treatment options, and is a complaint that carries with it a high risk of complications and morbidity.  The differential diagnosis includes UTI, muscle strain, fracture, hyperglycemia, pyelonephritis, cauda equina syndrome, soft tissue injury, medication induced.   Co morbidities that complicate the patient evaluation  Bipolar Seizure Depression   Additional history obtained:  Additional history obtained from Outside Medical Records   External records from outside source obtained and reviewed including urgent care visit note from yesterday.   Lab Tests:  I Ordered, and personally interpreted labs.  The pertinent results include: Urinalysis not indicative of infection and shows no glucose or ketones in urine.   Imaging Studies ordered:  I ordered imaging studies including lumbar spine x-ray I independently visualized and interpreted imaging which showed no acute findings I agree with the radiologist interpretation   Medicines ordered and prescription drug management:  Triage provider ordered medication including toradol  and robaxin   for back pain  Reevaluation of the  patient after these medicines showed that the patient stayed the same I have reviewed the patients home medicines and have made adjustments as needed   Problem List / ED Course:     History with no red flag symptoms suspicious for cauda equina syndrome or traumatic injury. Urinalysis not indicate of infection or hyperglycemia. Imaging shows no evidence of fracture.   Low back pain. Vitals, history, and physical exam are reassuring. Imaging shows no acute findings. I do not feel additional emergent work up warranted at this time. Encouraged follow up with primary care for ongoing evaluation. Return precautions discussed and patient verbalized understanding. Increased urinary frequency and dry mouth. Patient with no history of diabetes. Urinalysis is not indicative of infection and shows no glucose or ketones. CBG performed yesterday at urgent care was within normal limits. I do not suspect hyperglycemia. Cannot rule out medication side effect. I do not feel additional emergent work up warranted at this time. Encouraged follow up with primary care for ongoing evaluation. Return precautions discussed and patient verbalized understanding.   Reevaluation:  After the interventions noted above, I reevaluated the patient and found that they have :improved   Dispostion:  After consideration of the diagnostic results and the patients response to treatment, I feel that the patent would benefit from supportive care in the home setting with use of OTC medications for pain relief and close follow up with PCP. Patient provided with information for [facility] as she is transitioning mental health providers with appointment to establish with [facility] 1/23. Return precautions given.                                   Medical Decision Making  This note was produced using Dragon Medical voice recognition. While the provider has reviewed and verified all clinical information, transcription errors may remain.     Final diagnoses:  Bilateral low back pain without sciatica, unspecified chronicity    ED Discharge Orders     None          Rosina Almarie LABOR, PA-C 05/30/24 1408    Pamella Sharper A, DO 05/30/24 1549  "

## 2024-05-30 NOTE — Discharge Instructions (Addendum)
 It was a pleasure meeting with you today.  Imaging showed no acute fractures or findings.  You can take over-the-counter medications for ongoing back pain.  Follow-up with your primary care as needed.  Please return if symptoms worsen or any new concerning symptoms develop.  Please use Tylenol  or ibuprofen  for pain.  You may use 600 mg ibuprofen  every 6 hours or 1000 mg of Tylenol  every 6 hours.  You may choose to alternate between the 2.  This would be most effective.  Not to exceed 4 g of Tylenol  within 24 hours.  Not to exceed 3200 mg ibuprofen  24 hours.
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
## 2024-05-30 NOTE — ED Triage Notes (Signed)
 Pt BIB EMS with c/o lower back pain, ongoing for 2 weeks. Seen at [facility] yesterday, dx with urinary frequency and advised to take tylenol . Hasn't taken tylenol  this morning.   130/60 Hr 104 96% room air

## 2024-06-02 ENCOUNTER — Other Ambulatory Visit: Payer: Self-pay | Admitting: Student

## 2024-06-02 DIAGNOSIS — Z1231 Encounter for screening mammogram for malignant neoplasm of breast: Secondary | ICD-10-CM

## 2024-06-03 ENCOUNTER — Ambulatory Visit

## 2024-06-05 ENCOUNTER — Emergency Department (HOSPITAL_COMMUNITY)
Admission: EM | Admit: 2024-06-05 | Discharge: 2024-06-05 | Disposition: A | Discharge status: Home / Self Care | Attending: Emergency Medicine | Admitting: Emergency Medicine

## 2024-06-05 ENCOUNTER — Emergency Department (HOSPITAL_COMMUNITY)

## 2024-06-05 ENCOUNTER — Encounter (HOSPITAL_COMMUNITY): Payer: Self-pay | Admitting: Emergency Medicine

## 2024-06-05 DIAGNOSIS — K760 Fatty (change of) liver, not elsewhere classified: Secondary | ICD-10-CM | POA: Diagnosis not present

## 2024-06-05 DIAGNOSIS — R109 Unspecified abdominal pain: Secondary | ICD-10-CM

## 2024-06-05 DIAGNOSIS — R1011 Right upper quadrant pain: Secondary | ICD-10-CM | POA: Diagnosis present

## 2024-06-05 LAB — URINALYSIS, ROUTINE W REFLEX MICROSCOPIC
Bilirubin Urine: NEGATIVE
Glucose, UA: NEGATIVE mg/dL
Hgb urine dipstick: NEGATIVE
Ketones, ur: NEGATIVE mg/dL
Leukocytes,Ua: NEGATIVE
Nitrite: NEGATIVE
Protein, ur: NEGATIVE mg/dL
Specific Gravity, Urine: 1.015 (ref 1.005–1.030)
pH: 5 (ref 5.0–8.0)

## 2024-06-05 LAB — CBC
HCT: 36 % (ref 36.0–46.0)
Hemoglobin: 11.6 g/dL — ABNORMAL LOW (ref 12.0–15.0)
MCH: 22.1 pg — ABNORMAL LOW (ref 26.0–34.0)
MCHC: 32.2 g/dL (ref 30.0–36.0)
MCV: 68.6 fL — ABNORMAL LOW (ref 80.0–100.0)
Platelets: 164 K/uL (ref 150–400)
RBC: 5.25 MIL/uL — ABNORMAL HIGH (ref 3.87–5.11)
RDW: 16.3 % — ABNORMAL HIGH (ref 11.5–15.5)
WBC: 5.5 K/uL (ref 4.0–10.5)
nRBC: 0 % (ref 0.0–0.2)

## 2024-06-05 LAB — COMPREHENSIVE METABOLIC PANEL WITH GFR
ALT: 12 U/L (ref 0–44)
AST: 24 U/L (ref 15–41)
Albumin: 3.7 g/dL (ref 3.5–5.0)
Alkaline Phosphatase: 101 U/L (ref 38–126)
Anion gap: 12 (ref 5–15)
BUN: 7 mg/dL (ref 6–20)
CO2: 23 mmol/L (ref 22–32)
Calcium: 8.3 mg/dL — ABNORMAL LOW (ref 8.9–10.3)
Chloride: 103 mmol/L (ref 98–111)
Creatinine, Ser: 1.12 mg/dL — ABNORMAL HIGH (ref 0.44–1.00)
GFR, Estimated: 59 mL/min — ABNORMAL LOW
Glucose, Bld: 97 mg/dL (ref 70–99)
Potassium: 3 mmol/L — ABNORMAL LOW (ref 3.5–5.1)
Sodium: 138 mmol/L (ref 135–145)
Total Bilirubin: 0.3 mg/dL (ref 0.0–1.2)
Total Protein: 7.1 g/dL (ref 6.5–8.1)

## 2024-06-05 LAB — LIPASE, BLOOD: Lipase: 36 U/L (ref 11–51)

## 2024-06-05 MED ORDER — ONDANSETRON 4 MG PO TBDP: 4.0000 mg | ORAL_TABLET | Freq: Three times a day (TID) | ORAL | 0 refills | Status: AC | PRN

## 2024-06-05 MED ORDER — IOHEXOL 350 MG/ML SOLN
75.0000 mL | Freq: Once | INTRAVENOUS | Status: AC | PRN
  Administered 2024-06-05: 75 mL via INTRAVENOUS

## 2024-06-05 MED ORDER — MORPHINE SULFATE (PF) 4 MG/ML IV SOLN
4.0000 mg | Freq: Once | INTRAVENOUS | Status: AC
  Administered 2024-06-05: 4 mg via INTRAVENOUS
  Filled 2024-06-05: qty 1

## 2024-06-05 MED ORDER — POTASSIUM CHLORIDE CRYS ER 20 MEQ PO TBCR: 20.0000 meq | EXTENDED_RELEASE_TABLET | Freq: Every day | ORAL | 0 refills | Status: AC

## 2024-06-05 MED ORDER — ONDANSETRON HCL 4 MG/2ML IJ SOLN
4.0000 mg | Freq: Once | INTRAMUSCULAR | Status: AC
  Administered 2024-06-05: 4 mg via INTRAVENOUS
  Filled 2024-06-05: qty 2

## 2024-06-05 MED ORDER — LACTATED RINGERS IV BOLUS
1000.0000 mL | Freq: Once | INTRAVENOUS | Status: AC
  Administered 2024-06-05: 1000 mL via INTRAVENOUS

## 2024-06-05 MED ORDER — POTASSIUM CHLORIDE CRYS ER 20 MEQ PO TBCR
40.0000 meq | EXTENDED_RELEASE_TABLET | Freq: Once | ORAL | Status: AC
  Administered 2024-06-05: 40 meq via ORAL
  Filled 2024-06-05: qty 2

## 2024-06-05 NOTE — Discharge Instructions (Signed)
 You were seen for your abdominal pain in the emergency department.   At home, please take the zofran  for your nausea and vomiting. Take tylenol  for your pain.    Check your MyChart online for the results of any tests that had not resulted by the time you left the emergency department.   Follow-up with your primary doctor in 2-3 days regarding your visit.  Follow-up with the GI doctors about your liver chagnes  Return immediately to the emergency department if you experience any of the following: worsening pain, vomiting despite the medication, or any other concerning symptoms.    Thank you for visiting our Emergency Department. It was a pleasure taking care of you today.

## 2024-06-05 NOTE — ED Provider Notes (Incomplete)
 " [place] EMERGENCY DEPARTMENT AT [facility] Provider Note   CSN: [id number] Arrival date & time: 06/05/24  1731     Patient presents with: Abdominal Pain   Melissa Dougherty is a 54 y.o. female.  {Add pertinent medical, surgical, social history, OB history to HPI:6169} 54 year old female history of esophagoplasty and tubal ligation who presents to the emergency department with abdominal pain nausea and vomiting.  Patient reports that today she started developing diffuse abdominal pain.  Initially told triage it was in the right upper quadrant predominantly.  Is postprandial.  Has had 3 episodes of nonbloody nonbilious emesis.  Last bowel movement was 3 to 4 days ago.  Reports she is not passing gas.  No fevers or chills.  No dysuria or frequency.       Prior to Admission medications  Medication Sig Start Date End Date Taking? Authorizing Provider  chlorhexidine  (PERIDEX ) 0.12 % solution Use as directed 15 mLs in the mouth or throat 2 (two) times daily. 11/20/22   Neldon Hamp RAMAN, PA  divalproex  (DEPAKOTE ) 500 MG DR tablet Take 1 tablet (500 mg total) by mouth every 12 (twelve) hours. 11/26/22 12/26/22  Massengill, Rankin, MD  hydrOXYzine  (ATARAX ) 25 MG tablet Take 1 tablet (25 mg total) by mouth 3 (three) times daily as needed for itching or anxiety. 11/26/22   Massengill, Rankin, MD  ibuprofen  (ADVIL ) 800 MG tablet Take 1 tablet (800 mg total) by mouth 3 (three) times daily. 05/29/24   Dreama, [place]  N, FNP  paliperidone  (INVEGA  SUSTENNA) 156 MG/ML SUSY injection Inject 1 mL (156 mg total) into the muscle every 28 (twenty-eight) days. Next dose is due on 12-09-22. 12/09/22   Johny Rankin, MD  pantoprazole  (PROTONIX ) 20 MG tablet Take 1 tablet (20 mg total) by mouth daily. 11/26/22 12/26/22  Massengill, Rankin, MD  prazosin  (MINIPRESS ) 1 MG capsule Take 1 capsule (1 mg total) by mouth at bedtime. 11/26/22 12/26/22  Massengill, Rankin, MD  propranolol  (INDERAL ) 10 MG tablet Take 1  tablet (10 mg total) by mouth every 12 (twelve) hours. 11/26/22 12/26/22  Massengill, Rankin, MD  QUEtiapine  (SEROQUEL ) 300 MG tablet Take 1 tablet (300 mg total) by mouth at bedtime. 11/26/22 12/26/22  Massengill, Rankin, MD  risperiDONE  (RISPERDAL ) 1 MG tablet Take 1 tablet (1 mg total) by mouth at bedtime. 05/30/24 06/29/24  Dasie Ellouise CROME, FNP  temazepam  (RESTORIL ) 15 MG capsule Take 1 capsule (15 mg total) by mouth at bedtime for 7 days. 11/26/22 12/03/22  Massengill, Rankin, MD  benztropine  (COGENTIN ) 0.5 MG tablet Take 0.5 mg by mouth at bedtime. 01/17/19 05/31/19  [provider]  omeprazole (PRILOSEC) 20 MG capsule Take 20 mg by mouth 2 (two) times daily before a meal.  03/03/19 05/31/19  [provider]  rizatriptan  (MAXALT ) 10 MG tablet Take 1 tablet by mouth at the onset of headache. May repeat in 2 hours if needed Patient not taking: Reported on 01/31/2019 01/02/16 05/31/19  Calone, Gregory D, FNP    Allergies: Trazodone  and nefazodone    Review of Systems  Updated Vital Signs BP 107/74   Pulse 100   Temp 98 F (36.7 C)   Resp 18   SpO2 99%   Physical Exam Vitals and nursing note reviewed.  Constitutional:      General: She is not in acute distress.    Appearance: She is well-developed.  HENT:     Head: Normocephalic and atraumatic.     Right Ear: External ear normal.  Left Ear: External ear normal.     Nose: Nose normal.  Eyes:     Extraocular Movements: Extraocular movements intact.     Conjunctiva/sclera: Conjunctivae normal.     Pupils: Pupils are equal, round, and reactive to light.  Abdominal:     General: Abdomen is flat. There is no distension.     Palpations: Abdomen is soft. There is no mass.     Tenderness: There is abdominal tenderness (Mild, diffuse). There is no guarding.  Musculoskeletal:     Cervical back: Normal range of motion and neck supple.  Skin:    General: Skin is warm and dry.  Neurological:     Mental Status: She is alert and oriented to  person, place, and time. Mental status is at baseline.  Psychiatric:        Mood and Affect: Mood normal.     (all labs ordered are listed, but only abnormal results are displayed) Labs Reviewed  COMPREHENSIVE METABOLIC PANEL WITH GFR - Abnormal; Notable for the following components:      Result Value   Potassium 3.0 (*)    Creatinine, Ser 1.12 (*)    Calcium  8.3 (*)    GFR, Estimated 59 (*)    All other components within normal limits  CBC - Abnormal; Notable for the following components:   RBC 5.25 (*)    Hemoglobin 11.6 (*)    MCV 68.6 (*)    MCH 22.1 (*)    RDW 16.3 (*)    All other components within normal limits  URINALYSIS, ROUTINE W REFLEX MICROSCOPIC - Abnormal; Notable for the following components:   APPearance HAZY (*)    All other components within normal limits  LIPASE, BLOOD    EKG: None  Radiology: No results found.  {Document cardiac monitor, telemetry assessment procedure when appropriate:32947} Procedures   Medications Ordered in the ED  potassium chloride  SA (KLOR-CON  M) CR tablet 40 mEq (has no administration in time range)  lactated ringers  bolus 1,000 mL (has no administration in time range)  ondansetron  (ZOFRAN ) injection 4 mg (has no administration in time range)  morphine  (PF) 4 MG/ML injection 4 mg (has no administration in time range)      {Click here for ABCD2, HEART and other calculators REFRESH Note before signing:1}                              Medical Decision Making Amount and/or Complexity of Data Reviewed Labs: ordered. Radiology: ordered.  Risk Prescription drug management.   ***  {Document critical care time when appropriate  Document review of labs and clinical decision tools ie CHADS2VASC2, etc  Document your independent review of radiology images and any outside records  Document your discussion with family members, caretakers and with consultants  Document social determinants of health affecting pt's care   Document your decision making why or why not admission, treatments were needed:32947:::1}   Final diagnoses:  None    ED Discharge Orders     None        "

## 2024-06-05 NOTE — ED Provider Triage Note (Signed)
 Emergency Medicine Provider Triage Evaluation Note  Melissa Dougherty , a 54 y.o. female  was evaluated in triage.  Pt complains of abdominal pain with nausea and vomiting, constipation.  Review of Systems  Positive: Abdominal pain right upper quadrant, nausea, vomiting, constipation Negative: Fever, chills, chest pain, shortness of breath  Physical Exam  BP 107/74   Pulse 100   Temp 98 F (36.7 C)   Resp 18   SpO2 99%  Gen:   Awake, no distress   Resp:  Normal effort  MSK:   Moves extremities without difficulty  Other:    Medical Decision Making  Medically screening exam initiated at 5:40 PM.  Appropriate orders placed.  CHARLISA CHAM was informed that the remainder of the evaluation will be completed by another provider, this initial triage assessment does not replace that evaluation, and the importance of remaining in the ED until their evaluation is complete.  Labs ordered   Francis Ileana SAILOR, PA-C 06/05/24 1741

## 2024-06-05 NOTE — ED Triage Notes (Signed)
 Pt here from home with c/I ruq abd pain that started today , some slight nausea

## 2024-06-05 NOTE — ED Provider Notes (Signed)
.  Ultrasound ED Peripheral IV (Provider)  Date/Time: 06/05/2024 8:58 PM  Performed by: Guillermina Hamilton, MD Authorized by: Yolande Lamar BROCKS, MD   Procedure details:    Indications: multiple failed IV attempts     Skin Prep: chlorhexidine  gluconate     Location:  Right AC   Angiocath:  18 G   Bedside Ultrasound Guided: Yes     Images: not archived     Patient tolerated procedure without complications: Yes     Dressing applied: Yes    Hamilton BROCKS Guillermina MD, PGY-2    Guillermina Hamilton, MD 06/05/24 2059    Yolande Lamar BROCKS, MD 06/06/24 1218

## 2024-06-06 ENCOUNTER — Ambulatory Visit (HOSPITAL_COMMUNITY)
Admission: EM | Admit: 2024-06-06 | Discharge: 2024-06-06 | Disposition: A | Discharge status: Home / Self Care | Attending: Psychiatry | Admitting: Psychiatry

## 2024-06-06 DIAGNOSIS — F319 Bipolar disorder, unspecified: Secondary | ICD-10-CM | POA: Diagnosis not present

## 2024-06-06 DIAGNOSIS — F419 Anxiety disorder, unspecified: Secondary | ICD-10-CM | POA: Diagnosis present

## 2024-06-06 DIAGNOSIS — Z658 Other specified problems related to psychosocial circumstances: Secondary | ICD-10-CM | POA: Diagnosis not present

## 2024-06-06 DIAGNOSIS — Z79899 Other long term (current) drug therapy: Secondary | ICD-10-CM | POA: Insufficient documentation

## 2024-06-06 DIAGNOSIS — F439 Reaction to severe stress, unspecified: Secondary | ICD-10-CM

## 2024-06-06 NOTE — ED Provider Notes (Signed)
 Behavioral Health Urgent Care Medical Screening Exam  Patient Name: Melissa Dougherty MRN: [id number] Date of Evaluation: 06/06/2024 Chief Complaint:   Diagnosis:  Final diagnoses:  Long-term use of high-risk medication  Stress at home    History of Present illness: Melissa Dougherty is a 54 y.o. female with a history of bipolar 1 disorder, schizoaffective disorder, MDD, prior polysubstance use reported to be in sustained remission (last use 10 years ago per pt) and anxiety presenting to the [facility] voluntarily in setting of emotional distress secondary to chronic psychosocial stressors. PMHx is significant for esophageal plasty, multiple abdominal surgeries, foot surgery, hysterectomy and tubal ligation.  The patient has a scheduled appointment at [facility] for ACTT services on January 23rd.  She expressed interest in establishing care with a therapist, within expressed interest in bouncing off ideas to become less emotionally dependent on [her] boyfriend.  At the time of assessment the patient reports she has been taking Risperdal  5 mg nightly, but has continued to feel that she is not making good decisions and that she feels off balance emotionally.  She reports she has been compliant on her oral regimen of Risperdal .  She reports she was previously on Invega  Sustenna, but has missed approximately 5 months of shots.  She also reports she has been producing milk from both breasts and has been missing periods for several months.  Patient advised to follow-up with PCP and ACT team services as this may be a side effect of her prescribed Risperdal .  During the assessment the patient denies suicidal ideations.  She denies homicidal ideations.  She denies any auditory visual hallucinations.  There is no objective evidence of manic symptoms, the patient speech normal in both rate and volume.  Her reported mood of being stressed is congruent with her affect.  The process is linear and thought content is  logical without any evidence of perceptual disturbances, paranoia, or delusional thought processes.  At the time of the assessment the patient denies any recent substance use.  She reports that she has been sober from alcohol, cannabis, and stimulants for the past 10 years.  The patient currently lives with her boyfriend, who is 26 years old.  She reports that she relies on him financially and does not feel safe in the environment as there are people who come in and out of the house to use drugs.  The patient reports she has had to sustain sobriety for the past 10 years and fears relapse being in the environment.  She declines offer for women shelter at this time.  She has 2 adult children who live in [place] , did not seem amenable to the idea of asking if she could live or stay with them for a temporary period of time.  Flowsheet Row ED from 06/06/2024 in [facility] ED from 06/05/2024 in [facility] Emergency Department at [facility] ED from 05/30/2024 in [facility]  C-SSRS RISK CATEGORY No Risk No Risk No Risk    Psychiatric Specialty Exam  Presentation  General Appearance:Casual  Eye Contact:Good  Speech:Clear and Coherent; Normal Rate  Speech Volume:Normal   Mood and Affect  Mood: -- (off balance)  Affect: Congruent; Full Range   Thought Process  Thought Processes: Linear  Descriptions of Associations:Intact  Orientation:None  Thought Content:Logical Diagnosis of Schizophrenia or Schizoaffective disorder in past: Yes Duration of Psychotic Symptoms: >32months  Hallucinations:None  Ideas of Reference:None  Suicidal Thoughts:No  Homicidal Thoughts:No   Sensorium  Memory: Immediate Good; Recent Good; Remote Good  Judgment: Fair  Insight: Fair   Executive Functions  Concentration: Good  Attention Span: Good  Recall: Good  Fund of Knowledge: Good  Language: Good   Psychomotor  Activity  Psychomotor Activity: Normal   Assets  Assets: Desire for Improvement; Resilience; Communication Skills   Sleep  Sleep: Good    Physical Exam: Physical Exam Constitutional:      General: She is not in acute distress. HENT:     Head: Normocephalic and atraumatic.  Eyes:     Extraocular Movements: Extraocular movements intact.     Conjunctiva/sclera: Conjunctivae normal.  Pulmonary:     Effort: Pulmonary effort is normal. No respiratory distress.  Musculoskeletal:        General: Normal range of motion.  Neurological:     General: No focal deficit present.     Mental Status: She is alert and oriented to person, place, and time.    Review of Systems  All other systems reviewed and are negative.  There were no vitals taken for this visit. There is no height or weight on file to calculate BMI.    [facility] Discharge Disposition for Follow up and Recommendations: Based on my evaluation the patient does not appear to have an emergency medical condition and can be discharged with resources and follow up care in outpatient services for Individual Therapy The patient was educated on walk-in hours for [facility], AVS provided.  - The patient's complaint of galactorrhea that is likely secondary to SGA (Risperdal ) should be reviewed by ACTT  services.  Will likely need to transition to LAI with lower risk of hyperprolactinemia, do not recommend she transition to Invega  Sustenna.   Marlo Masson, MD 06/06/2024, 6:50 PM

## 2024-06-06 NOTE — Discharge Instructions (Addendum)
 Dear Melissa Dougherty,  Most effective treatment for your mental health disease involves BOTH a psychiatrist AND a therapist Psychiatrist to manage medications Therapist to help identify personal goals, barriers from those goals, and plan to achieve those goals by understanding emotions Please make regular appointments with an outpatient psychiatrist and other doctors once you leave the hospital (if any, otherwise, please see below for resources to make an appointment).  For therapy outside the hospital, please ask for these specific types of therapy: DBT ________________________________________________________  SAFETY CRISIS  Dial 988 for National Suicide & Crisis Lifeline    Text [phone] for Crisis Text Line:     [facility] U[facility]ENT [facility]:  [address] FIRST FLOOR.  [place], [place] [address].  [phone]  Mobile Crisis Response Teams Listed by counties in vicinity of [facility] providers [facility] [phone] [facility] [phone] [facility] [phone] [facility] [phone] [place]                * [facility] [phone]                * [facility] [phone] [facility] [phone] [facility]  [phone] * [facility] [phone] ________________________________________________________  To see which pharmacy near you is the CHEAPEST for certain medications, please use GoodRx. It is free website and has a free phone app.    Also consider looking at [facility] $4.00 or [facility]'s $7.00 prescription list. Both are free to view if googled [facility]4 prescription and [facility]'s $7 prescription. These are set prices, no insurance required. [facility] low cost medications: $4-$15 for 30days  prescriptions or $10-$38 for 90days prescriptions  ________________________________________________________  Difficulties with sleep?   Can also use this free app for insomnia called CBT-I. Let your doctors and therapists know so they can help with extra tips and tricks or for guidance and accountability. NO ADDS on the app.     ________________________________________________________  Non-Emergent / Urgent  [facility] [address], SECOND FLOOR [place], [place] [address] [phone] OUTPATIENT Walk-in information: Please note, all walk-ins are first come & first serve, with limited number of availability.  Please note that to be eligible for services you must bring: ID or a piece of mail with your name [facility] address  Therapist for therapy:  Monday & Wednesdays: Please ARRIVE at 7:15 AM for registration Will START at 8:00 AM Every 1st & 2nd Friday of the month: Please ARRIVE at 10:15 AM for registration Will START at 1 PM - 5 PM  Regretfully, due to limited availability, please be aware that you may not been seen on the same day as walk-in. Please consider making an appoint or try again. Thank you for your patience and understanding.

## 2024-06-06 NOTE — Progress Notes (Signed)
" °   06/06/24 1806  [facility] Triage Screening (Walk-ins at [facility] only)  How Did You Hear About Us ? Family/Friend  What Is the Reason for Your Visit/Call Today? Kezia Benevides is a 54 year old female with a history of bipolar 1 disorder, schizoaffective disorder, MDD and anxiety who presents with worsening depression, in the context of multiple psychosocial stressors. Current presentation appears most consistent with a depressive episode related to her historical bipolar disorder diagnosis. Patient denies SI/HI/AVH. Denies alcohol/drug use. She states that her sister called police, as she requested her to do so. Patient states that she was previously, prescribed Invega  injections, and would like to receive that injection today. Patient reports she lives with friends.  How Long Has This Been Causing You Problems? <Week  Have You Recently Had Any Thoughts About Hurting Yourself? No  Are You Planning to Commit Suicide/Harm Yourself At This time? No  Have you Recently Had Thoughts About Hurting Someone Sherral? No  Are You Planning To Harm Someone At This Time? No  Physical Abuse Denies  Verbal Abuse Denies  Sexual Abuse Denies  Exploitation of patient/patient's resources Denies  Self-Neglect Denies  Possible abuse reported to:  (Patient denies.)  Are you currently experiencing any auditory, visual or other hallucinations? No  Have You Used Any Alcohol or Drugs in the Past 24 Hours? No  Do you have any current medical co-morbidities that require immediate attention? No  Please describe current medical co-morbidities that require immediate attention: Patient denies.  Clinician description of patient physical appearance/behavior: Tearful  What Do You Feel Would Help You the Most Today? Medication(s);Treatment for Depression or other mood problem  If access to [facility] Urgent Care was not available, would you have sought care in the Emergency Department? No  Determination of Need Urgent (48 hours)  Options For  Referral Medication Management;Inpatient Hospitalization;Outpatient Therapy  Determination of Need filed? Yes    "

## 2024-06-30 ENCOUNTER — Ambulatory Visit
